# Patient Record
Sex: Female | Born: 1950 | Race: Black or African American | Hispanic: No | Smoking: Former smoker
Health system: Southern US, Community
[De-identification: ages and names within clinical notes are randomized; demographics above are authoritative.]

## PROBLEM LIST (undated history)

## (undated) DIAGNOSIS — I341 Nonrheumatic mitral (valve) prolapse: Secondary | ICD-10-CM

## (undated) DIAGNOSIS — I509 Heart failure, unspecified: Secondary | ICD-10-CM

## (undated) DIAGNOSIS — G43909 Migraine, unspecified, not intractable, without status migrainosus: Secondary | ICD-10-CM

## (undated) DIAGNOSIS — Z9289 Personal history of other medical treatment: Secondary | ICD-10-CM

## (undated) HISTORY — PX: BUNIONECTOMY WITH HAMMERTOE RECONSTRUCTION: SHX5600

## (undated) HISTORY — PX: COLON SURGERY: SHX602

---

## 1972-07-17 HISTORY — PX: TUBAL LIGATION: SHX77

## 1996-07-17 HISTORY — PX: ABDOMINAL HYSTERECTOMY: SHX81

## 1997-11-30 ENCOUNTER — Other Ambulatory Visit: Admission: RE | Admit: 1997-11-30 | Discharge: 1997-11-30 | Payer: Self-pay | Admitting: Obstetrics

## 1998-02-17 ENCOUNTER — Inpatient Hospital Stay (HOSPITAL_COMMUNITY): Admission: RE | Admit: 1998-02-17 | Discharge: 1998-02-20 | Payer: Self-pay | Admitting: Obstetrics

## 1999-06-29 ENCOUNTER — Other Ambulatory Visit: Admission: RE | Admit: 1999-06-29 | Discharge: 1999-06-29 | Payer: Self-pay | Admitting: Obstetrics

## 1999-07-19 ENCOUNTER — Encounter: Admission: RE | Admit: 1999-07-19 | Discharge: 1999-07-19 | Payer: Self-pay | Admitting: Obstetrics

## 1999-07-19 ENCOUNTER — Encounter: Payer: Self-pay | Admitting: Obstetrics

## 1999-07-29 ENCOUNTER — Encounter: Admission: RE | Admit: 1999-07-29 | Discharge: 1999-07-29 | Payer: Self-pay | Admitting: Obstetrics

## 1999-07-29 ENCOUNTER — Encounter: Payer: Self-pay | Admitting: Obstetrics

## 2000-01-27 ENCOUNTER — Encounter: Payer: Self-pay | Admitting: Obstetrics

## 2000-01-27 ENCOUNTER — Encounter: Admission: RE | Admit: 2000-01-27 | Discharge: 2000-01-27 | Payer: Self-pay | Admitting: Obstetrics

## 2000-08-20 ENCOUNTER — Encounter: Admission: RE | Admit: 2000-08-20 | Discharge: 2000-08-20 | Payer: Self-pay | Admitting: Obstetrics

## 2000-08-20 ENCOUNTER — Encounter: Payer: Self-pay | Admitting: Obstetrics

## 2000-10-04 ENCOUNTER — Other Ambulatory Visit: Admission: RE | Admit: 2000-10-04 | Discharge: 2000-10-04 | Payer: Self-pay | Admitting: Obstetrics

## 2001-07-17 HISTORY — PX: APPENDECTOMY: SHX54

## 2001-10-07 ENCOUNTER — Encounter: Payer: Self-pay | Admitting: Obstetrics

## 2001-10-07 ENCOUNTER — Encounter: Admission: RE | Admit: 2001-10-07 | Discharge: 2001-10-07 | Payer: Self-pay | Admitting: Obstetrics

## 2002-10-13 ENCOUNTER — Ambulatory Visit (HOSPITAL_COMMUNITY): Admission: RE | Admit: 2002-10-13 | Discharge: 2002-10-13 | Payer: Self-pay | Admitting: Obstetrics

## 2002-10-13 ENCOUNTER — Encounter: Payer: Self-pay | Admitting: Obstetrics

## 2003-04-07 ENCOUNTER — Emergency Department (HOSPITAL_COMMUNITY): Admission: EM | Admit: 2003-04-07 | Discharge: 2003-04-07 | Payer: Self-pay | Admitting: Emergency Medicine

## 2003-06-03 ENCOUNTER — Ambulatory Visit (HOSPITAL_COMMUNITY): Admission: RE | Admit: 2003-06-03 | Discharge: 2003-06-03 | Payer: Self-pay | Admitting: Orthopedic Surgery

## 2003-10-19 ENCOUNTER — Ambulatory Visit (HOSPITAL_COMMUNITY): Admission: RE | Admit: 2003-10-19 | Discharge: 2003-10-19 | Payer: Self-pay | Admitting: Obstetrics

## 2004-02-15 ENCOUNTER — Ambulatory Visit (HOSPITAL_COMMUNITY): Admission: RE | Admit: 2004-02-15 | Discharge: 2004-02-15 | Payer: Self-pay | Admitting: Gastroenterology

## 2004-11-30 ENCOUNTER — Ambulatory Visit (HOSPITAL_COMMUNITY): Admission: RE | Admit: 2004-11-30 | Discharge: 2004-11-30 | Payer: Self-pay | Admitting: Obstetrics

## 2006-01-01 ENCOUNTER — Ambulatory Visit (HOSPITAL_COMMUNITY): Admission: RE | Admit: 2006-01-01 | Discharge: 2006-01-01 | Payer: Self-pay | Admitting: Obstetrics

## 2007-02-01 ENCOUNTER — Ambulatory Visit (HOSPITAL_COMMUNITY): Admission: RE | Admit: 2007-02-01 | Discharge: 2007-02-01 | Payer: Self-pay | Admitting: Internal Medicine

## 2008-02-10 ENCOUNTER — Ambulatory Visit (HOSPITAL_COMMUNITY): Admission: RE | Admit: 2008-02-10 | Discharge: 2008-02-10 | Payer: Self-pay | Admitting: Internal Medicine

## 2009-02-11 ENCOUNTER — Ambulatory Visit (HOSPITAL_COMMUNITY): Admission: RE | Admit: 2009-02-11 | Discharge: 2009-02-11 | Payer: Self-pay | Admitting: Internal Medicine

## 2009-03-02 ENCOUNTER — Ambulatory Visit: Payer: Self-pay | Admitting: Specialist

## 2010-04-04 ENCOUNTER — Ambulatory Visit (HOSPITAL_COMMUNITY): Admission: RE | Admit: 2010-04-04 | Discharge: 2010-04-04 | Payer: Self-pay | Admitting: Internal Medicine

## 2010-12-02 NOTE — Op Note (Signed)
NAME:  Savannah Bell, Savannah Bell                       ACCOUNT NO.:  [id number]   MEDICAL RECORD NO.:  [id number]                   PATIENT TYPE:  AMB   LOCATION:  ENDO                                 FACILITY:  [facility]   PHYSICIAN:  Anselmo Rod, M.D.               DATE OF BIRTH:  [birth year]   DATE OF PROCEDURE:  02/15/2004  DATE OF DISCHARGE:                                 OPERATIVE REPORT   PROCEDURE:  Screening colonoscopy.   ENDOSCOPIST:  Anselmo Rod, M.D.   INSTRUMENT:  Olympus video colonoscope.   INDICATIONS FOR PROCEDURE:  A 60 year old white female underwent screening  colonoscopy to rule out colonic polyps, masses, etc.   PRE-PROCEDURE PREPARATION:  Informed consent was procured from the patient.  Patient fasted for 8 hours prior to the procedure and prepped with a bottle  of magnesium citrate and a gallon of GoLYTELY the night prior to the  procedure.  She also received 1 g Ancef MVP prophylaxis prior to the  procedure.  Pre-procedure physical:  Patient had stable vital signs, neck  supple, chest clear to auscultation, S1/S2 regular, abdomen soft with normal  bowel sounds.   DESCRIPTION OF PROCEDURE:  The patient was placed in the left lateral  decubitus position, sedated with 70 mg of Demerol and 8 mg of Versed along  with 1 g of Ancef.  Once the patient was adequately sedated and maintained  on low flow oxygen, continuous cardiac monitoring; the Olympus video  colonoscope was advanced from the rectum to the cecum.  The appendiceal  orifice and the ileocecal valve were clearly visualized, photographed. The  patient had a somewhat tortuous colon.  The patient's position had to be  changed from the left lateral to the supine, and the right lateral position,  with gentle application of abdominal pressure on a couple of occasions to  reach the cecum.  No masses, polyps, erosions, ulcerations or diverticula  were seen.  Retroflexion in the rectum revealed no  abnormalities.   IMPRESSION:  1. Essentially unrevealing colonoscopy of the cecum.  2. Very tortuous colon, technically difficult procedure.   RECOMMENDATIONS:  1. Repeat colonoscopy has been recommended in the next 10 years unless the     patient develops any abnormal     symptoms in the interim.  2. Continue a high fiber diet with liberal fluid intake.  3. Outpatient follow up as the need arises in the future.                                               Anselmo Rod, M.D.    JNM/MEDQ  D:  02/15/2004  T:  02/15/2004  Job:  [id number]   cc:   Olene Craven, M.D.  [address]  [address]  [place]  [place] [address]  Fax: [phone]

## 2011-05-30 ENCOUNTER — Other Ambulatory Visit (HOSPITAL_COMMUNITY): Payer: Self-pay | Admitting: Nurse Practitioner

## 2011-05-30 DIAGNOSIS — Z1231 Encounter for screening mammogram for malignant neoplasm of breast: Secondary | ICD-10-CM

## 2011-07-03 ENCOUNTER — Ambulatory Visit (HOSPITAL_COMMUNITY)
Admission: RE | Admit: 2011-07-03 | Discharge: 2011-07-03 | Disposition: A | Discharge status: Home / Self Care | Payer: No Typology Code available for payment source | Source: Ambulatory Visit | Attending: Nurse Practitioner | Admitting: Nurse Practitioner

## 2011-07-03 DIAGNOSIS — Z1231 Encounter for screening mammogram for malignant neoplasm of breast: Secondary | ICD-10-CM | POA: Insufficient documentation

## 2011-09-02 ENCOUNTER — Encounter (HOSPITAL_COMMUNITY): Payer: Self-pay | Admitting: *Deleted

## 2011-09-02 ENCOUNTER — Inpatient Hospital Stay (HOSPITAL_COMMUNITY)
Admission: EM | Admit: 2011-09-02 | Discharge: 2011-09-15 | DRG: 330 | Disposition: A | Discharge status: Home Health | Payer: No Typology Code available for payment source | Attending: General Surgery | Admitting: General Surgery

## 2011-09-02 ENCOUNTER — Emergency Department (HOSPITAL_COMMUNITY): Payer: No Typology Code available for payment source

## 2011-09-02 ENCOUNTER — Other Ambulatory Visit: Payer: Self-pay

## 2011-09-02 DIAGNOSIS — K56609 Unspecified intestinal obstruction, unspecified as to partial versus complete obstruction: Secondary | ICD-10-CM

## 2011-09-02 DIAGNOSIS — K559 Vascular disorder of intestine, unspecified: Secondary | ICD-10-CM | POA: Diagnosis present

## 2011-09-02 DIAGNOSIS — R109 Unspecified abdominal pain: Secondary | ICD-10-CM

## 2011-09-02 DIAGNOSIS — K562 Volvulus: Secondary | ICD-10-CM | POA: Diagnosis present

## 2011-09-02 DIAGNOSIS — Y838 Other surgical procedures as the cause of abnormal reaction of the patient, or of later complication, without mention of misadventure at the time of the procedure: Secondary | ICD-10-CM | POA: Diagnosis not present

## 2011-09-02 DIAGNOSIS — K560 Paralytic ileus: Secondary | ICD-10-CM | POA: Diagnosis not present

## 2011-09-02 DIAGNOSIS — R112 Nausea with vomiting, unspecified: Secondary | ICD-10-CM | POA: Diagnosis present

## 2011-09-02 DIAGNOSIS — K929 Disease of digestive system, unspecified: Secondary | ICD-10-CM | POA: Diagnosis not present

## 2011-09-02 DIAGNOSIS — E876 Hypokalemia: Secondary | ICD-10-CM | POA: Diagnosis not present

## 2011-09-02 DIAGNOSIS — D62 Acute posthemorrhagic anemia: Secondary | ICD-10-CM | POA: Diagnosis not present

## 2011-09-02 DIAGNOSIS — K565 Intestinal adhesions [bands], unspecified as to partial versus complete obstruction: Principal | ICD-10-CM | POA: Diagnosis present

## 2011-09-02 HISTORY — DX: Nonrheumatic mitral (valve) prolapse: I34.1

## 2011-09-02 LAB — URINE MICROSCOPIC-ADD ON

## 2011-09-02 LAB — COMPREHENSIVE METABOLIC PANEL
ALT: 17 U/L (ref 0–35)
AST: 27 U/L (ref 0–37)
Albumin: 3.8 g/dL (ref 3.5–5.2)
Alkaline Phosphatase: 102 U/L (ref 39–117)
BUN: 20 mg/dL (ref 6–23)
CO2: 18 mEq/L — ABNORMAL LOW (ref 19–32)
Calcium: 9.9 mg/dL (ref 8.4–10.5)
Chloride: 97 mEq/L (ref 96–112)
Creatinine, Ser: 1.04 mg/dL (ref 0.50–1.10)
GFR calc Af Amer: 67 mL/min — ABNORMAL LOW (ref >90)
GFR calc non Af Amer: 58 mL/min — ABNORMAL LOW (ref >90)
Glucose, Bld: 210 mg/dL — ABNORMAL HIGH (ref 70–99)
Potassium: 4.9 mEq/L (ref 3.5–5.1)
Sodium: 136 mEq/L (ref 135–145)
Total Bilirubin: 0.5 mg/dL (ref 0.3–1.2)
Total Protein: 7.8 g/dL (ref 6.0–8.3)

## 2011-09-02 LAB — URINALYSIS, ROUTINE W REFLEX MICROSCOPIC
Bilirubin Urine: NEGATIVE
Glucose, UA: NEGATIVE mg/dL
Ketones, ur: NEGATIVE mg/dL
Leukocytes, UA: NEGATIVE
Nitrite: NEGATIVE
Protein, ur: 30 mg/dL — AB
Specific Gravity, Urine: >1.046 — ABNORMAL HIGH (ref 1.005–1.030)
Urobilinogen, UA: 0.2 mg/dL (ref 0.0–1.0)
pH: 5.5 (ref 5.0–8.0)

## 2011-09-02 LAB — DIFFERENTIAL
Basophils Absolute: 0 10*3/uL (ref 0.0–0.1)
Basophils Relative: 0 % (ref 0–1)
Eosinophils Absolute: 0 10*3/uL (ref 0.0–0.7)
Eosinophils Relative: 0 % (ref 0–5)
Lymphocytes Relative: 13 % (ref 12–46)
Lymphs Abs: 0.7 10*3/uL (ref 0.7–4.0)
Monocytes Absolute: 0.2 10*3/uL (ref 0.1–1.0)
Monocytes Relative: 4 % (ref 3–12)
Neutro Abs: 4.6 10*3/uL (ref 1.7–7.7)
Neutrophils Relative %: 83 % — ABNORMAL HIGH (ref 43–77)

## 2011-09-02 LAB — POCT I-STAT TROPONIN I: Troponin i, poc: 0.06 ng/mL (ref 0.00–0.08)

## 2011-09-02 LAB — CBC
HCT: 51.6 % — ABNORMAL HIGH (ref 36.0–46.0)
Hemoglobin: 18.1 g/dL — ABNORMAL HIGH (ref 12.0–15.0)
MCH: 30 pg (ref 26.0–34.0)
MCHC: 35.1 g/dL (ref 30.0–36.0)
MCV: 85.4 fL (ref 78.0–100.0)
Platelets: 258 10*3/uL (ref 150–400)
RBC: 6.04 MIL/uL — ABNORMAL HIGH (ref 3.87–5.11)
RDW: 13 % (ref 11.5–15.5)
WBC: 5.5 10*3/uL (ref 4.0–10.5)

## 2011-09-02 LAB — POCT I-STAT, CHEM 8
BUN: 22 mg/dL (ref 6–23)
Calcium, Ion: 1.12 mmol/L (ref 1.12–1.32)
Chloride: 103 mEq/L (ref 96–112)
Creatinine, Ser: 0.9 mg/dL (ref 0.50–1.10)
Glucose, Bld: 213 mg/dL — ABNORMAL HIGH (ref 70–99)
HCT: 56 % — ABNORMAL HIGH (ref 36.0–46.0)
Hemoglobin: 19 g/dL — ABNORMAL HIGH (ref 12.0–15.0)
Potassium: 4.1 mEq/L (ref 3.5–5.1)
Sodium: 139 mEq/L (ref 135–145)
TCO2: 23 mmol/L (ref 0–100)

## 2011-09-02 LAB — OCCULT BLOOD, POC DEVICE: Fecal Occult Bld: NEGATIVE

## 2011-09-02 LAB — LIPASE, BLOOD: Lipase: 89 U/L — ABNORMAL HIGH (ref 11–59)

## 2011-09-02 LAB — LACTIC ACID, PLASMA: Lactic Acid, Venous: 5.5 mmol/L — ABNORMAL HIGH (ref 0.5–2.2)

## 2011-09-02 MED ORDER — HEPARIN SODIUM (PORCINE) 5000 UNIT/ML IJ SOLN
5000.0000 [IU] | Freq: Three times a day (TID) | INTRAMUSCULAR | Status: DC
  Administered 2011-09-02 – 2011-09-15 (×38): 5000 [IU] via SUBCUTANEOUS
  Filled 2011-09-02 (×44): qty 1

## 2011-09-02 MED ORDER — KCL IN DEXTROSE-NACL 20-5-0.45 MEQ/L-%-% IV SOLN
INTRAVENOUS | Status: DC
  Administered 2011-09-02 – 2011-09-03 (×5): via INTRAVENOUS
  Filled 2011-09-02 (×7): qty 1000

## 2011-09-02 MED ORDER — IOHEXOL 300 MG/ML  SOLN: 100.0000 mL | Freq: Once | INTRAMUSCULAR | Status: DC | PRN

## 2011-09-02 MED ORDER — HYDROMORPHONE HCL PF 1 MG/ML IJ SOLN
1.0000 mg | Freq: Once | INTRAMUSCULAR | Status: AC
  Administered 2011-09-02: 1 mg via INTRAVENOUS
  Filled 2011-09-02: qty 1

## 2011-09-02 MED ORDER — SODIUM CHLORIDE 0.9 % IV BOLUS (SEPSIS)
1000.0000 mL | Freq: Once | INTRAVENOUS | Status: AC
  Administered 2011-09-02: 1000 mL via INTRAVENOUS

## 2011-09-02 MED ORDER — ONDANSETRON HCL 4 MG/2ML IJ SOLN
4.0000 mg | Freq: Once | INTRAMUSCULAR | Status: AC
  Administered 2011-09-02: 4 mg via INTRAVENOUS
  Filled 2011-09-02: qty 2

## 2011-09-02 MED ORDER — ONDANSETRON HCL 4 MG/2ML IJ SOLN
4.0000 mg | Freq: Four times a day (QID) | INTRAMUSCULAR | Status: DC | PRN
  Administered 2011-09-04 (×2): 4 mg via INTRAVENOUS
  Filled 2011-09-02 (×2): qty 2

## 2011-09-02 MED ORDER — PANTOPRAZOLE SODIUM 40 MG IV SOLR
40.0000 mg | Freq: Every day | INTRAVENOUS | Status: DC
  Administered 2011-09-02 – 2011-09-14 (×13): 40 mg via INTRAVENOUS
  Filled 2011-09-02 (×14): qty 40

## 2011-09-02 MED ORDER — LIP MEDEX EX OINT
TOPICAL_OINTMENT | CUTANEOUS | Status: AC
  Administered 2011-09-02: 15:00:00
  Filled 2011-09-02: qty 7

## 2011-09-02 MED ORDER — MORPHINE SULFATE 2 MG/ML IJ SOLN
0.5000 mg | INTRAMUSCULAR | Status: DC | PRN
  Administered 2011-09-03 – 2011-09-05 (×4): 0.5 mg via INTRAVENOUS
  Filled 2011-09-02 (×4): qty 1

## 2011-09-02 MED ORDER — DIPHENHYDRAMINE HCL 12.5 MG/5ML PO ELIX: 12.5000 mg | ORAL_SOLUTION | Freq: Four times a day (QID) | ORAL | Status: DC | PRN

## 2011-09-02 MED ORDER — DIPHENHYDRAMINE HCL 50 MG/ML IJ SOLN: 12.5000 mg | Freq: Four times a day (QID) | INTRAMUSCULAR | Status: DC | PRN

## 2011-09-02 NOTE — ED Notes (Signed)
Assisted MD with rectal exam

## 2011-09-02 NOTE — ED Notes (Signed)
Pt arrived via triage,  She is from home where she resides alone pt has vomited 5 times since yesterday,  She has abdominal pain upper right quadrant continuing to right lower quadrant

## 2011-09-02 NOTE — ED Notes (Signed)
Pt completed oral contrast

## 2011-09-02 NOTE — ED Notes (Signed)
Pt to ct 

## 2011-09-02 NOTE — ED Notes (Signed)
Pt has vomites x 5 or 6 since yesterday.  Pt is having difficulty keeping PO food or fluid down.  Pt denies diarrhea.

## 2011-09-02 NOTE — ED Notes (Signed)
Blood cultures times two sent to main lab

## 2011-09-02 NOTE — ED Provider Notes (Signed)
  Physical Exam  BP 125/80  Pulse 105  Temp(Src) 97.6 F (36.4 C) (Oral)  Resp 17  Ht 4\' 11"  (1.499 m)  Wt 143 lb (64.864 kg)  BMI 28.88 kg/m2  SpO2 95%  Physical Exam  ED Course  Procedures  MDM  7:31 AM Pt is currently comfortable, waiting for CT scan.  IVF's being given.     8:39 AM Dilated SB loops and mesenteric inflammation noted by radiologist.  Will admit.  Lactic acid is elevated CO2 on electrolyte panel is low at 18.  Pt remains fairly comfortable for now.  ECG shows no arrythmia's.  Could still be due to severe dehydration.  Will need inpt monitoring.      8:57 AM Rectal exam with chaperone performed.  Scant stool in vault.  Pt denies flatus since yesterday.  Pt has had 2 surgeris in the past, I favor SBO.  Will try NGT, consult surgery.  Heme negative occult testing.  Will continue IVF's, and antiemetics.    9:10 AM Dr. Daphine Deutscher to see pt in the ED.  Gavin Pound. Oletta Lamas, MD 09/02/11 [phone]

## 2011-09-02 NOTE — ED Notes (Signed)
Report received from night RN. First contact with patient. Pt sitting up in bed, eyes closed, oxygen 2 L [place] infusing. Finished contrast. Now waiting for CT scan. Pt aware we need urine sample.

## 2011-09-02 NOTE — ED Notes (Signed)
Transfer to 1527-report given to [employer]

## 2011-09-02 NOTE — H&P (Signed)
Chief Complaint:  Abdominal pain, nausea and vomiting x 1 day  History of Present Illness:  Savannah Bell is an 61 y.o. female who presented this am with a history of abdominal pain with nausea and vomiting.  CT scan obtained was reviewed and showed ascites, a pharygian cap of the GB, some thickened bowel loops and evidence of a SBO.  She reports no flatus for 1 day.  She previously had a ruptured appendicits managed with a drain by Mosetta Anis.    Past Medical History  Diagnosis Date  . Mitral valve prolapse     Past Surgical History  Procedure Date  . Appendectomy   . Abdominal hysterectomy     Current Facility-Administered Medications  Medication Dose Route Frequency Provider Last Rate Last Dose  . HYDROmorphone (DILAUDID) injection 1 mg  1 mg Intravenous Once Gwyneth Sprout, MD   1 mg at 09/02/11 0615  . iohexol (OMNIPAQUE) 300 MG/ML solution 100 mL  100 mL Intravenous Once PRN Medication Radiologist, MD      . ondansetron (ZOFRAN) injection 4 mg  4 mg Intravenous Once Gwyneth Sprout, MD   4 mg at 09/02/11 1610  . sodium chloride 0.9 % bolus 1,000 mL  1,000 mL Intravenous Once Gwyneth Sprout, MD   1,000 mL at 09/02/11 9604   Current Outpatient Prescriptions  Medication Sig Dispense Refill  . aspirin 325 MG tablet Take 325 mg by mouth daily.      . calcium gluconate 500 MG tablet Take 500 mg by mouth daily.      . cholecalciferol (VITAMIN D) 1000 UNITS tablet Take 1,000 Units by mouth daily.       Review of patient's allergies indicates no known allergies. History reviewed. No pertinent family history. Social History:   does not have a smoking history on file. She does not have any smokeless tobacco history on file. Her alcohol and drug histories not on file.   REVIEW OF SYSTEMS - PERTINENT POSITIVES ONLY: negative  Physical Exam:   Blood pressure 120/87, pulse 112, temperature 97.6 F (36.4 C), temperature source Oral, resp. rate 17, height 4\' 11"  (1.499 m),  weight 143 lb (64.864 kg), SpO2 100.00%. Body mass index is 28.88 kg/(m^2).  Gen:  WDWN AA NAD with NG tube in place Neurological: Alert and oriented to person, place, and time. Motor and sensory function is grossly intact  Head: Normocephalic and atraumatic.  Eyes: Conjunctivae are normal. Pupils are equal, round, and reactive to light. No scleral icterus.  Neck: Normal range of motion. Neck supple. No tracheal deviation or thyromegaly present.  Cardiovascular:  SR without murmurs or gallops.  No carotid bruits Respiratory: Effort normal.  No respiratory distress. No chest wall tenderness. Breath sounds normal.  No wheezes, rales or rhonchi.  Abdomen:  Flat, no rebound or guarding.  She reports that the pain that she was having is gone GU: Musculoskeletal: Normal range of motion. Extremities are nontender. No cyanosis, edema or clubbing noted Lymphadenopathy: No cervical, preauricular, postauricular or axillary adenopathy is present Skin: Skin is warm and dry. No rash noted. No diaphoresis. No erythema. No pallor. Pscyh: Normal mood and affect. Behavior is normal. Judgment and thought content normal.   LABORATORY RESULTS: Results for orders placed during the hospital encounter of 09/02/11 (from the past 48 hour(s))  COMPREHENSIVE METABOLIC PANEL     Status: Abnormal   Collection Time   09/02/11  6:00 AM      Component Value Range Comment   Sodium 136  135 - 145 (mEq/L)    Potassium 4.9  3.5 - 5.1 (mEq/L)    Chloride 97  96 - 112 (mEq/L)    CO2 18 (*) 19 - 32 (mEq/L)    Glucose, Bld 210 (*) 70 - 99 (mg/dL)    BUN 20  6 - 23 (mg/dL)    Creatinine, Ser 1.61  0.50 - 1.10 (mg/dL)    Calcium 9.9  8.4 - 10.5 (mg/dL)    Total Protein 7.8  6.0 - 8.3 (g/dL)    Albumin 3.8  3.5 - 5.2 (g/dL)    AST 27  0 - 37 (U/L)    ALT 17  0 - 35 (U/L)    Alkaline Phosphatase 102  39 - 117 (U/L)    Total Bilirubin 0.5  0.3 - 1.2 (mg/dL)    GFR calc non Af Amer 58 (*) >90 (mL/min)    GFR calc Af Amer 67  (*) >90 (mL/min)   CBC     Status: Abnormal   Collection Time   09/02/11  6:00 AM      Component Value Range Comment   WBC 5.5  4.0 - 10.5 (K/uL)    RBC 6.04 (*) 3.87 - 5.11 (MIL/uL)    Hemoglobin 18.1 (*) 12.0 - 15.0 (g/dL)    HCT 09.6 (*) 04.5 - 46.0 (%)    MCV 85.4  78.0 - 100.0 (fL)    MCH 30.0  26.0 - 34.0 (pg)    MCHC 35.1  30.0 - 36.0 (g/dL)    RDW 40.9  81.1 - 91.4 (%)    Platelets 258  150 - 400 (K/uL)   DIFFERENTIAL     Status: Abnormal   Collection Time   09/02/11  6:00 AM      Component Value Range Comment   Neutrophils Relative 83 (*) 43 - 77 (%)    Neutro Abs 4.6  1.7 - 7.7 (K/uL)    Lymphocytes Relative 13  12 - 46 (%)    Lymphs Abs 0.7  0.7 - 4.0 (K/uL)    Monocytes Relative 4  3 - 12 (%)    Monocytes Absolute 0.2  0.1 - 1.0 (K/uL)    Eosinophils Relative 0  0 - 5 (%)    Eosinophils Absolute 0.0  0.0 - 0.7 (K/uL)    Basophils Relative 0  0 - 1 (%)    Basophils Absolute 0.0  0.0 - 0.1 (K/uL)   LIPASE, BLOOD     Status: Abnormal   Collection Time   09/02/11  6:00 AM      Component Value Range Comment   Lipase 89 (*) 11 - 59 (U/L)   LACTIC ACID, PLASMA     Status: Abnormal   Collection Time   09/02/11  6:00 AM      Component Value Range Comment   Lactic Acid, Venous 5.5 (*) 0.5 - 2.2 (mmol/L)   POCT I-STAT TROPONIN I     Status: Normal   Collection Time   09/02/11  6:17 AM      Component Value Range Comment   Troponin i, poc 0.06  0.00 - 0.08 (ng/mL)    Comment 3            POCT I-STAT, CHEM 8     Status: Abnormal   Collection Time   09/02/11  6:19 AM      Component Value Range Comment   Sodium 139  135 - 145 (mEq/L)    Potassium 4.1  3.5 -  5.1 (mEq/L)    Chloride 103  96 - 112 (mEq/L)    BUN 22  6 - 23 (mg/dL)    Creatinine, Ser 1.19  0.50 - 1.10 (mg/dL)    Glucose, Bld 147 (*) 70 - 99 (mg/dL)    Calcium, Ion 8.29  1.12 - 1.32 (mmol/L)    TCO2 23  0 - 100 (mmol/L)    Hemoglobin 19.0 (*) 12.0 - 15.0 (g/dL)    HCT 56.2 (*) 13.0 - 46.0 (%)   URINALYSIS,  ROUTINE W REFLEX MICROSCOPIC     Status: Abnormal   Collection Time   09/02/11  8:32 AM      Component Value Range Comment   Color, Urine YELLOW  YELLOW     APPearance CLEAR  CLEAR     Specific Gravity, Urine >1.046 (*) 1.005 - 1.030     pH 5.5  5.0 - 8.0     Glucose, UA NEGATIVE  NEGATIVE (mg/dL)    Hgb urine dipstick MODERATE (*) NEGATIVE     Bilirubin Urine NEGATIVE  NEGATIVE     Ketones, ur NEGATIVE  NEGATIVE (mg/dL)    Protein, ur 30 (*) NEGATIVE (mg/dL)    Urobilinogen, UA 0.2  0.0 - 1.0 (mg/dL)    Nitrite NEGATIVE  NEGATIVE     Leukocytes, UA NEGATIVE  NEGATIVE    URINE MICROSCOPIC-ADD ON     Status: Abnormal   Collection Time   09/02/11  8:32 AM      Component Value Range Comment   Squamous Epithelial / LPF RARE  RARE     WBC, UA 0-2  <3 (WBC/hpf)    RBC / HPF 3-6  <3 (RBC/hpf)    Casts HYALINE CASTS (*) NEGATIVE    OCCULT BLOOD, POC DEVICE     Status: Normal   Collection Time   09/02/11  8:59 AM      Component Value Range Comment   Fecal Occult Bld NEGATIVE       RADIOLOGY RESULTS: Ct Abdomen Pelvis W Contrast  09/02/2011  *RADIOLOGY REPORT*  Clinical Data: Nausea, vomiting.  CT ABDOMEN AND PELVIS WITH CONTRAST  Technique:  Multidetector CT imaging of the abdomen and pelvis was performed following the standard protocol during bolus administration of intravenous contrast.  Contrast:  100 ml Omnipaque-300  Comparison: None.  Findings: Limited images through the lung bases demonstrate no significant appreciable abnormality. The heart size is within normal limits. No pleural or pericardial effusion.  Unremarkable liver, spleen, pancreas, adrenal glands, kidneys. There is mucosal hyperenhancement of  the gallbladder wall.  No radiodense gallstones.  No biliary ductal dilatation.  Decompressed bladder.  Absent uterus.  No adnexal mass.  Appendix not identified.  There are several loops of small bowel with distension up to 3 cm and bowel wall thickening.  There is stranding of the  small bowel mesentery and the a moderate amount of ascites.  No free intraperitoneal air identified.  Normal caliber vasculature.  No acute osseous abnormality.  T11-12 fusion may be congenital.  IMPRESSION: There are several loops of dilated small bowel with bowel wall thickening and perienteric inflammation.  Bowel ischemia not excluded.  The pattern appears obstructed as bowel distal to this point is decompressed.  There is a moderate amount of ascites.  No free intraperitoneal air.  Discussed via telephone with Jaynie Crumble in the emergency room at 08:25 a.m. on 09/02/2011.  Original Report Authenticated By: Waneta Martins, M.D.    Problem List: Patient Active Problem List  Diagnoses  . SBO (small bowel obstruction)    Assessment & Plan: SBO; feeling better at present; slight bump in lactic acid;  WBC normal.  Will observe with NG tube for now.      Matt B. Daphine Deutscher, MD, [facility] [phone] beeper [phone]  09/02/2011 10:46 AM

## 2011-09-02 NOTE — ED Provider Notes (Signed)
History     CSN: [id number]  Arrival date & time 09/02/11  4098   First MD Initiated Contact with Patient 09/02/11 [phone]      Chief Complaint  Patient presents with  . Abdominal Pain  . Nausea  . Emesis    (Consider location/radiation/quality/duration/timing/severity/associated sxs/prior treatment) Patient is a 61 y.o. female presenting with abdominal pain and vomiting. The history is provided by the patient and a relative.  Abdominal Pain The primary symptoms of the illness include abdominal pain, nausea and vomiting. The primary symptoms of the illness do not include fever, shortness of breath, diarrhea or dysuria. The current episode started yesterday. The onset of the illness was gradual. The problem has been gradually worsening.  The abdominal pain began yesterday (Has not felt well for 2 days the pain didn't start until yesterday). The pain came on gradually. The abdominal pain is located in the epigastric region. The abdominal pain radiates to the RLQ, LUQ, LLQ and RUQ. The severity of the abdominal pain is 10/10. The abdominal pain is relieved by nothing. The abdominal pain is exacerbated by vomiting, movement and eating.  Nausea began yesterday.  Vomiting occurs 6 to 10 times per day. The emesis contains stomach contents.  The patient has not had a change in bowel habit. Additional symptoms associated with the illness include chills, anorexia and diaphoresis. Symptoms associated with the illness do not include constipation, urgency, frequency or back pain. Significant associated medical issues do not include PUD, GERD, diabetes or gallstones.  Emesis  Associated symptoms include abdominal pain and chills. Pertinent negatives include no diarrhea and no fever.    History reviewed. No pertinent past medical history.  Past Surgical History  Procedure Date  . Appendectomy     History reviewed. No pertinent family history.  History  Substance Use Topics  . Smoking status: Not  on file  . Smokeless tobacco: Not on file  . Alcohol Use:     OB History    Grav Para Term Preterm Abortions TAB SAB Ect Mult Living                  Review of Systems  Constitutional: Positive for chills and diaphoresis. Negative for fever.  Respiratory: Negative for shortness of breath.   Gastrointestinal: Positive for nausea, vomiting, abdominal pain and anorexia. Negative for diarrhea and constipation.  Genitourinary: Negative for dysuria, urgency and frequency.  Musculoskeletal: Negative for back pain.  All other systems reviewed and are negative.    Allergies  Review of patient's allergies indicates no known allergies.  Home Medications  No current outpatient prescriptions on file.  BP 99/62  Pulse 130  Temp(Src) 97.5 F (36.4 C) (Oral)  Resp 17  Ht 4\' 11"  (1.499 m)  Wt 143 lb (64.864 kg)  BMI 28.88 kg/m2  SpO2 100%  Physical Exam  Nursing note and vitals reviewed. Constitutional: She is oriented to person, place, and time. She appears well-developed and well-nourished. She appears distressed.  HENT:  Head: Normocephalic and atraumatic.  Mouth/Throat: Mucous membranes are dry.  Eyes: EOM are normal. Pupils are equal, round, and reactive to light.  Cardiovascular: Regular rhythm, normal heart sounds and intact distal pulses.  Tachycardia present.  Exam reveals no friction rub.   No murmur heard. Pulmonary/Chest: Effort normal and breath sounds normal. She has no wheezes. She has no rales.  Abdominal: Soft. She exhibits no distension. Bowel sounds are decreased. There is generalized tenderness. There is rebound and guarding. There is no CVA  tenderness. No hernia.  Musculoskeletal: Normal range of motion. She exhibits no edema and no tenderness.       No edema  Neurological: She is alert and oriented to person, place, and time. No cranial nerve deficit.  Skin: No rash noted. She is diaphoretic. There is pallor.  Psychiatric: She has a normal mood and affect. Her  behavior is normal.    ED Course  Procedures (including critical care time)  Labs Reviewed  COMPREHENSIVE METABOLIC PANEL - Abnormal; Notable for the following:    CO2 18 (*)    Glucose, Bld 210 (*)    GFR calc non Af Amer 58 (*)    GFR calc Af Amer 67 (*)    All other components within normal limits  CBC - Abnormal; Notable for the following:    RBC 6.04 (*)    Hemoglobin 18.1 (*)    HCT 51.6 (*)    All other components within normal limits  DIFFERENTIAL - Abnormal; Notable for the following:    Neutrophils Relative 83 (*)    All other components within normal limits  LIPASE, BLOOD - Abnormal; Notable for the following:    Lipase 89 (*)    All other components within normal limits  LACTIC ACID, PLASMA - Abnormal; Notable for the following:    Lactic Acid, Venous 5.5 (*)    All other components within normal limits  POCT I-STAT, CHEM 8 - Abnormal; Notable for the following:    Glucose, Bld 213 (*)    Hemoglobin 19.0 (*)    HCT 56.0 (*)    All other components within normal limits  POCT I-STAT TROPONIN I  URINALYSIS, ROUTINE W REFLEX MICROSCOPIC   No results found.   Date: 09/02/2011  Rate: 97  Rhythm: normal sinus rhythm  QRS Axis: normal  Intervals: normal  ST/T Wave abnormalities: normal  Conduction Disutrbances:none  Narrative Interpretation:   Old EKG Reviewed: none available    No diagnosis found.    MDM   Patient with abdominal pain concern for a viscus. She's not been feeling well for the last few days and then started having severe abdominal pain and vomiting for the last 12 hours. Patient is to, tachycardic and mildly hypotensive. She has no past medical history and is otherwise healthy. She was peritoneal signs on exam his tenderness and no localized findings. Mildly diaphoretic but normal mental status. She has normal pulses and doubt this is a AAA.  Suspicion for cardiac or pulmonary etiology.  A CBC, CMP, lactate, UA, lipase pending. Will rule out  pancreatitis, cholecystitis, perforation. CT of the abdomen pending. IV fluids, pain and nausea medication given.  6:48 AM CBC with normal white blood cell count hemoconcentrated. Lactic acid elevated at 5 consistent with dehydration. After one round of pain medication on reevaluation the patient's pain is much improved and she is drinking oral contrast. EKG within normal limits troponin negative.  CT pending.  Pt checked out to Dr. Debbe Odea, MD 09/02/11 [phone]

## 2011-09-03 ENCOUNTER — Inpatient Hospital Stay (HOSPITAL_COMMUNITY): Payer: No Typology Code available for payment source

## 2011-09-03 LAB — BASIC METABOLIC PANEL
BUN: 31 mg/dL — ABNORMAL HIGH (ref 6–23)
CO2: 25 mEq/L (ref 19–32)
Calcium: 8.7 mg/dL (ref 8.4–10.5)
Chloride: 104 mEq/L (ref 96–112)
Creatinine, Ser: 1.31 mg/dL — ABNORMAL HIGH (ref 0.50–1.10)
GFR calc Af Amer: 51 mL/min — ABNORMAL LOW (ref >90)
GFR calc non Af Amer: 44 mL/min — ABNORMAL LOW (ref >90)
Glucose, Bld: 135 mg/dL — ABNORMAL HIGH (ref 70–99)
Potassium: 5.5 mEq/L — ABNORMAL HIGH (ref 3.5–5.1)
Sodium: 136 mEq/L (ref 135–145)

## 2011-09-03 LAB — CBC
HCT: 48.1 % — ABNORMAL HIGH (ref 36.0–46.0)
Hemoglobin: 16.2 g/dL — ABNORMAL HIGH (ref 12.0–15.0)
MCH: 29.1 pg (ref 26.0–34.0)
MCHC: 33.7 g/dL (ref 30.0–36.0)
MCV: 86.5 fL (ref 78.0–100.0)
Platelets: 201 10*3/uL (ref 150–400)
RBC: 5.56 MIL/uL — ABNORMAL HIGH (ref 3.87–5.11)
RDW: 13.5 % (ref 11.5–15.5)
WBC: 11.3 10*3/uL — ABNORMAL HIGH (ref 4.0–10.5)

## 2011-09-03 NOTE — Progress Notes (Signed)
Patient ID: Savannah Bell, female   DOB: January 13, 1951, 61 y.o.   MRN: [id number] [facility] Surgery Progress Note:   * No surgery found *  Subjective: Mental status is alert and clear.  Daughter in room Objective: Vital signs in last 24 hours: Temp:  [97.8 F (36.6 C)-98.8 F (37.1 C)] 98.3 F (36.8 C) (02/17 0955) Pulse Rate:  [108-132] 109  (02/17 0955) Resp:  [16-18] 16  (02/17 0955) BP: (93-122)/(53-84) 108/62 mmHg (02/17 0955) SpO2:  [96 %-98 %] 96 % (02/17 0955)  Intake/Output from previous day: 02/16 0701 - 02/17 0700 In: 1988.3 [I.V.:1958.3; NG/GT:30] Out: 1400 [Urine:350; Emesis/NG output:250] Intake/Output this shift: Total I/O In: -  Out: 100 [Urine:100]  Physical Exam: Work of breathing is  Normal.  Abdomen is less tender  Lab Results:  Results for orders placed during the hospital encounter of 09/02/11 (from the past 48 hour(s))  COMPREHENSIVE METABOLIC PANEL     Status: Abnormal   Collection Time   09/02/11  6:00 AM      Component Value Range Comment   Sodium 136  135 - 145 (mEq/L)    Potassium 4.9  3.5 - 5.1 (mEq/L)    Chloride 97  96 - 112 (mEq/L)    CO2 18 (*) 19 - 32 (mEq/L)    Glucose, Bld 210 (*) 70 - 99 (mg/dL)    BUN 20  6 - 23 (mg/dL)    Creatinine, Ser 4.09  0.50 - 1.10 (mg/dL)    Calcium 9.9  8.4 - 10.5 (mg/dL)    Total Protein 7.8  6.0 - 8.3 (g/dL)    Albumin 3.8  3.5 - 5.2 (g/dL)    AST 27  0 - 37 (U/L)    ALT 17  0 - 35 (U/L)    Alkaline Phosphatase 102  39 - 117 (U/L)    Total Bilirubin 0.5  0.3 - 1.2 (mg/dL)    GFR calc non Af Amer 58 (*) >90 (mL/min)    GFR calc Af Amer 67 (*) >90 (mL/min)   CBC     Status: Abnormal   Collection Time   09/02/11  6:00 AM      Component Value Range Comment   WBC 5.5  4.0 - 10.5 (K/uL)    RBC 6.04 (*) 3.87 - 5.11 (MIL/uL)    Hemoglobin 18.1 (*) 12.0 - 15.0 (g/dL)    HCT 81.1 (*) 91.4 - 46.0 (%)    MCV 85.4  78.0 - 100.0 (fL)    MCH 30.0  26.0 - 34.0 (pg)    MCHC 35.1  30.0 - 36.0 (g/dL)    RDW  78.2  95.6 - 21.3 (%)    Platelets 258  150 - 400 (K/uL)   DIFFERENTIAL     Status: Abnormal   Collection Time   09/02/11  6:00 AM      Component Value Range Comment   Neutrophils Relative 83 (*) 43 - 77 (%)    Neutro Abs 4.6  1.7 - 7.7 (K/uL)    Lymphocytes Relative 13  12 - 46 (%)    Lymphs Abs 0.7  0.7 - 4.0 (K/uL)    Monocytes Relative 4  3 - 12 (%)    Monocytes Absolute 0.2  0.1 - 1.0 (K/uL)    Eosinophils Relative 0  0 - 5 (%)    Eosinophils Absolute 0.0  0.0 - 0.7 (K/uL)    Basophils Relative 0  0 - 1 (%)    Basophils Absolute  0.0  0.0 - 0.1 (K/uL)   LIPASE, BLOOD     Status: Abnormal   Collection Time   09/02/11  6:00 AM      Component Value Range Comment   Lipase 89 (*) 11 - 59 (U/L)   LACTIC ACID, PLASMA     Status: Abnormal   Collection Time   09/02/11  6:00 AM      Component Value Range Comment   Lactic Acid, Venous 5.5 (*) 0.5 - 2.2 (mmol/L)   POCT I-STAT TROPONIN I     Status: Normal   Collection Time   09/02/11  6:17 AM      Component Value Range Comment   Troponin i, poc 0.06  0.00 - 0.08 (ng/mL)    Comment 3            POCT I-STAT, CHEM 8     Status: Abnormal   Collection Time   09/02/11  6:19 AM      Component Value Range Comment   Sodium 139  135 - 145 (mEq/L)    Potassium 4.1  3.5 - 5.1 (mEq/L)    Chloride 103  96 - 112 (mEq/L)    BUN 22  6 - 23 (mg/dL)    Creatinine, Ser 1.61  0.50 - 1.10 (mg/dL)    Glucose, Bld 096 (*) 70 - 99 (mg/dL)    Calcium, Ion 0.45  1.12 - 1.32 (mmol/L)    TCO2 23  0 - 100 (mmol/L)    Hemoglobin 19.0 (*) 12.0 - 15.0 (g/dL)    HCT 40.9 (*) 81.1 - 46.0 (%)   URINALYSIS, ROUTINE W REFLEX MICROSCOPIC     Status: Abnormal   Collection Time   09/02/11  8:32 AM      Component Value Range Comment   Color, Urine YELLOW  YELLOW     APPearance CLEAR  CLEAR     Specific Gravity, Urine >1.046 (*) 1.005 - 1.030     pH 5.5  5.0 - 8.0     Glucose, UA NEGATIVE  NEGATIVE (mg/dL)    Hgb urine dipstick MODERATE (*) NEGATIVE     Bilirubin  Urine NEGATIVE  NEGATIVE     Ketones, ur NEGATIVE  NEGATIVE (mg/dL)    Protein, ur 30 (*) NEGATIVE (mg/dL)    Urobilinogen, UA 0.2  0.0 - 1.0 (mg/dL)    Nitrite NEGATIVE  NEGATIVE     Leukocytes, UA NEGATIVE  NEGATIVE    URINE MICROSCOPIC-ADD ON     Status: Abnormal   Collection Time   09/02/11  8:32 AM      Component Value Range Comment   Squamous Epithelial / LPF RARE  RARE     WBC, UA 0-2  <3 (WBC/hpf)    RBC / HPF 3-6  <3 (RBC/hpf)    Casts HYALINE CASTS (*) NEGATIVE    OCCULT BLOOD, POC DEVICE     Status: Normal   Collection Time   09/02/11  8:59 AM      Component Value Range Comment   Fecal Occult Bld NEGATIVE     CBC     Status: Abnormal   Collection Time   09/03/11  4:50 AM      Component Value Range Comment   WBC 11.3 (*) 4.0 - 10.5 (K/uL)    RBC 5.56 (*) 3.87 - 5.11 (MIL/uL)    Hemoglobin 16.2 (*) 12.0 - 15.0 (g/dL)    HCT 91.4 (*) 78.2 - 46.0 (%)    MCV 86.5  78.0 -  100.0 (fL)    MCH 29.1  26.0 - 34.0 (pg)    MCHC 33.7  30.0 - 36.0 (g/dL)    RDW 16.1  09.6 - 04.5 (%)    Platelets 201  150 - 400 (K/uL)   BASIC METABOLIC PANEL     Status: Abnormal   Collection Time   09/03/11  4:50 AM      Component Value Range Comment   Sodium 136  135 - 145 (mEq/L)    Potassium 5.5 (*) 3.5 - 5.1 (mEq/L)    Chloride 104  96 - 112 (mEq/L)    CO2 25  19 - 32 (mEq/L)    Glucose, Bld 135 (*) 70 - 99 (mg/dL)    BUN 31 (*) 6 - 23 (mg/dL)    Creatinine, Ser 4.09 (*) 0.50 - 1.10 (mg/dL)    Calcium 8.7  8.4 - 10.5 (mg/dL)    GFR calc non Af Amer 44 (*) >90 (mL/min)    GFR calc Af Amer 51 (*) >90 (mL/min)     Radiology/Results: Dg Abd 1 View  09/03/2011  *RADIOLOGY REPORT*  Clinical Data: Abdominal pain  ABDOMEN - 1 VIEW  Comparison:   the previous day's study  Findings: Nasogastric tube extends into the decompressed stomach. Small bowel is nondilated.  There has been progression of the oral contrast material into the colon, which is nondilated.  No abnormal abdominal calcifications.   Right pelvic phleboliths.  Regional bones unremarkable.  IMPRESSION:  1.  Nasogastric tube placement to the stomach. 2.  Nonobstructive bowel gas pattern.  Original Report Authenticated By: Osa Craver, M.D.   Ct Abdomen Pelvis W Contrast  09/02/2011  *RADIOLOGY REPORT*  Clinical Data: Nausea, vomiting.  CT ABDOMEN AND PELVIS WITH CONTRAST  Technique:  Multidetector CT imaging of the abdomen and pelvis was performed following the standard protocol during bolus administration of intravenous contrast.  Contrast:  100 ml Omnipaque-300  Comparison: None.  Findings: Limited images through the lung bases demonstrate no significant appreciable abnormality. The heart size is within normal limits. No pleural or pericardial effusion.  Unremarkable liver, spleen, pancreas, adrenal glands, kidneys. There is mucosal hyperenhancement of  the gallbladder wall.  No radiodense gallstones.  No biliary ductal dilatation.  Decompressed bladder.  Absent uterus.  No adnexal mass.  Appendix not identified.  There are several loops of small bowel with distension up to 3 cm and bowel wall thickening.  There is stranding of the small bowel mesentery and the a moderate amount of ascites.  No free intraperitoneal air identified.  Normal caliber vasculature.  No acute osseous abnormality.  T11-12 fusion may be congenital.  IMPRESSION: There are several loops of dilated small bowel with bowel wall thickening and perienteric inflammation.  Bowel ischemia not excluded.  The pattern appears obstructed as bowel distal to this point is decompressed.  There is a moderate amount of ascites.  No free intraperitoneal air.  Discussed via telephone with Jaynie Crumble in the emergency room at 08:25 a.m. on 09/02/2011.  Original Report Authenticated By: Waneta Martins, M.D.    Anti-infectives: Anti-infectives    None      Assessment/Plan: Problem List: Patient Active Problem List  Diagnoses  . SBO (small bowel obstruction)      Contrast has reached the colon.  Will discontinue NG and advance to clear liquid diet * No surgery found *    LOS: 1 day   Matt B. Daphine Deutscher, MD, [facility] [phone]  beeper [phone]  09/03/2011 11:15 AM

## 2011-09-04 ENCOUNTER — Inpatient Hospital Stay (HOSPITAL_COMMUNITY): Payer: No Typology Code available for payment source

## 2011-09-04 DIAGNOSIS — K56609 Unspecified intestinal obstruction, unspecified as to partial versus complete obstruction: Secondary | ICD-10-CM

## 2011-09-04 LAB — CBC
HCT: 38.8 % (ref 36.0–46.0)
Hemoglobin: 13.2 g/dL (ref 12.0–15.0)
MCH: 29.3 pg (ref 26.0–34.0)
MCHC: 34 g/dL (ref 30.0–36.0)
MCV: 86 fL (ref 78.0–100.0)
Platelets: 151 10*3/uL (ref 150–400)
RBC: 4.51 MIL/uL (ref 3.87–5.11)
RDW: 13.1 % (ref 11.5–15.5)
WBC: 6.1 10*3/uL (ref 4.0–10.5)

## 2011-09-04 LAB — BASIC METABOLIC PANEL
BUN: 13 mg/dL (ref 6–23)
CO2: 26 mEq/L (ref 19–32)
Calcium: 8.7 mg/dL (ref 8.4–10.5)
Chloride: 98 mEq/L (ref 96–112)
Creatinine, Ser: 0.84 mg/dL (ref 0.50–1.10)
GFR calc Af Amer: 86 mL/min — ABNORMAL LOW (ref >90)
GFR calc non Af Amer: 75 mL/min — ABNORMAL LOW (ref >90)
Glucose, Bld: 137 mg/dL — ABNORMAL HIGH (ref 70–99)
Potassium: 4.8 mEq/L (ref 3.5–5.1)
Sodium: 131 mEq/L — ABNORMAL LOW (ref 135–145)

## 2011-09-04 MED ORDER — ONDANSETRON HCL 4 MG/2ML IJ SOLN
4.0000 mg | INTRAMUSCULAR | Status: DC | PRN
  Administered 2011-09-04 – 2011-09-05 (×6): 4 mg via INTRAVENOUS
  Filled 2011-09-04 (×6): qty 2

## 2011-09-04 MED ORDER — PROMETHAZINE HCL 25 MG/ML IJ SOLN
12.5000 mg | Freq: Once | INTRAMUSCULAR | Status: AC
  Administered 2011-09-04: 12.5 mg via INTRAVENOUS
  Filled 2011-09-04: qty 1

## 2011-09-04 MED ORDER — DEXTROSE-NACL 5-0.45 % IV SOLN
INTRAVENOUS | Status: DC
  Administered 2011-09-04 (×3): via INTRAVENOUS
  Administered 2011-09-05: 125 mL/h via INTRAVENOUS
  Administered 2011-09-05: 06:00:00 via INTRAVENOUS

## 2011-09-04 NOTE — Progress Notes (Signed)
Patient ID: Savannah Bell, female   DOB: 1950-12-03, 61 y.o.   MRN: [id number]    Subjective: Pt feeling worse today.  Had 1 episode of yellow colored emesis this morning.  Now feels less nausea after zofran.  Has yet to pass any flatus.  Does not feel distended.  Objective: Vital signs in last 24 hours: Temp:  [98.3 F (36.8 C)-99 F (37.2 C)] 98.7 F (37.1 C) (02/18 0621) Pulse Rate:  [100-111] 100  (02/18 0621) Resp:  [16-18] 18  (02/18 0621) BP: (108-119)/(62-80) 110/79 mmHg (02/18 0621) SpO2:  [95 %-98 %] 97 % (02/18 0621) Last BM Date: 09/01/11  Intake/Output from previous day: 02/17 0701 - 02/18 0700 In: 3438.8 [P.O.:420; I.V.:3018.8] Out: 1065 [Urine:1065] Intake/Output this shift: Total I/O In: -  Out: 250 [Urine:250]  PE: Abd: soft, mildly tender in epigastrum, few BS, ND Heart: mildly tachy, but regular Lungs: CTAB   Lab Results:   Basename 09/03/11 0450 09/02/11 0619 09/02/11 0600  WBC 11.3* -- 5.5  HGB 16.2* 19.0* --  HCT 48.1* 56.0* --  PLT 201 -- 258   BMET  Basename 09/03/11 0450 09/02/11 0619 09/02/11 0600  NA 136 139 --  K 5.5* 4.1 --  CL 104 103 --  CO2 25 -- 18*  GLUCOSE 135* 213* --  BUN 31* 22 --  CREATININE 1.31* 0.90 --  CALCIUM 8.7 -- 9.9   PT/INR No results found for this basename: LABPROT:2,INR:2 in the last 72 hours   Studies/Results: Dg Abd 1 View  09/03/2011  *RADIOLOGY REPORT*  Clinical Data: Abdominal pain  ABDOMEN - 1 VIEW  Comparison:   the previous day's study  Findings: Nasogastric tube extends into the decompressed stomach. Small bowel is nondilated.  There has been progression of the oral contrast material into the colon, which is nondilated.  No abnormal abdominal calcifications.  Right pelvic phleboliths.  Regional bones unremarkable.  IMPRESSION:  1.  Nasogastric tube placement to the stomach. 2.  Nonobstructive bowel gas pattern.  Original Report Authenticated By: Osa Craver, M.D.   Dg Abd Acute  W/chest  09/04/2011  *RADIOLOGY REPORT*  Clinical Data: Abdominal pain with nausea and constipation.  ACUTE ABDOMEN SERIES (ABDOMEN 2 VIEW & CHEST 1 VIEW)  Comparison: Abdominal films 09/03/2011.  CT abdomen pelvis 09/02/2011.  Findings:  Normal heart size.  Low lung volumes with bibasilar atelectasis.  Small left effusion.  No free air.  Nasogastric tube removed.  Abnormal gas pattern with distended mid abdominal small bowel loop with a long air-fluid level consistent with redeveloping partial small bowel obstruction.  The colon appears decompressed.  No abnormal calcifications are seen.  IMPRESSION: Interval worsening bowel gas pattern consistent with partial S B O following removal of nasogastric tube. Moderate distention of a mid abdominal small bowel loop with air fluid level.  Low lung volumes with bibasilar atelectasis and small effusions.  Original Report Authenticated By: Elsie Stain, M.D.    Anti-infectives: Anti-infectives    None       Assessment/Plan  1. PSBO, recurred after d/c NGT yesterday  Plan: 1. Will d/c clear liquids and make her NPO. 2. If she has further emesis, then she will need another NGT.  I have discussed this with her.  I have also discussed with her that if she does not improve she may require an operation.  She understands and is agreeable to all above mentioned. 3. Repeat films and labs in the am.   LOS: 2 days  OSBORNE,KELLY E 09/04/2011

## 2011-09-04 NOTE — Progress Notes (Signed)
Passed small amount flatus while I was in room, she still has hiccups and is burping though, some mild ab pain, no more nausea with meds, her abdomen is very mildly tender and not really distended, she has well healed low midline and rlq scars.  We discussed reexam in a few hours, if she does not continue to improve she will need ng tube back in and if not resolving by tomorrow will consider taking to or for loa given the length of time this has been occurring.

## 2011-09-04 NOTE — Progress Notes (Signed)
Pt having severe nausea.  Pt last given zofran at 0238.  Pt c/o gas and bloating feeling. Rocking chair in patients room and patient rocking.  Patient encouraged to back down from clears liquids. Paged Dr Dwain Sarna.  Awaiting response. 1610 notified Dr Dwain Sarna of the above. Order given for phenergan 12.5mg  x1 and abdominal series.  Report given to day rn.

## 2011-09-04 NOTE — Progress Notes (Signed)
Notified MD toth concern that patient is complaining about nausea zofran is every 6 hours and last given at 1422 Means,  N 09-04-11 18:43pm

## 2011-09-04 NOTE — Progress Notes (Signed)
Passing some more flatus and had small bm, will keep npo, recheck films am

## 2011-09-04 NOTE — Progress Notes (Signed)
Spoke with MD concerning about order, duplicate order, order for additional adb x ray discontinued Means,  N 09-04-11

## 2011-09-05 ENCOUNTER — Encounter (HOSPITAL_COMMUNITY): Admission: EM | Disposition: A | Discharge status: Home Health | Payer: Self-pay | Source: Home / Self Care

## 2011-09-05 ENCOUNTER — Inpatient Hospital Stay (HOSPITAL_COMMUNITY): Payer: No Typology Code available for payment source | Admitting: Anesthesiology

## 2011-09-05 ENCOUNTER — Encounter (HOSPITAL_COMMUNITY): Payer: Self-pay | Admitting: Anesthesiology

## 2011-09-05 ENCOUNTER — Inpatient Hospital Stay (HOSPITAL_COMMUNITY): Payer: No Typology Code available for payment source

## 2011-09-05 ENCOUNTER — Other Ambulatory Visit (INDEPENDENT_AMBULATORY_CARE_PROVIDER_SITE_OTHER): Payer: Self-pay | Admitting: General Surgery

## 2011-09-05 HISTORY — PX: APPLICATION OF WOUND VAC: SHX5189

## 2011-09-05 HISTORY — PX: LAPAROTOMY: SHX154

## 2011-09-05 HISTORY — PX: BOWEL RESECTION: SHX1257

## 2011-09-05 LAB — BASIC METABOLIC PANEL
BUN: 8 mg/dL (ref 6–23)
CO2: 28 mEq/L (ref 19–32)
Calcium: 8.6 mg/dL (ref 8.4–10.5)
Chloride: 98 mEq/L (ref 96–112)
Creatinine, Ser: 0.9 mg/dL (ref 0.50–1.10)
GFR calc Af Amer: 80 mL/min — ABNORMAL LOW (ref >90)
GFR calc non Af Amer: 69 mL/min — ABNORMAL LOW (ref >90)
Glucose, Bld: 146 mg/dL — ABNORMAL HIGH (ref 70–99)
Potassium: 4.7 mEq/L (ref 3.5–5.1)
Sodium: 132 mEq/L — ABNORMAL LOW (ref 135–145)

## 2011-09-05 LAB — CBC
HCT: 37.9 % (ref 36.0–46.0)
Hemoglobin: 12.6 g/dL (ref 12.0–15.0)
MCH: 28.5 pg (ref 26.0–34.0)
MCHC: 33.2 g/dL (ref 30.0–36.0)
MCV: 85.7 fL (ref 78.0–100.0)
Platelets: 160 10*3/uL (ref 150–400)
RBC: 4.42 MIL/uL (ref 3.87–5.11)
RDW: 12.9 % (ref 11.5–15.5)
WBC: 3.9 10*3/uL — ABNORMAL LOW (ref 4.0–10.5)

## 2011-09-05 LAB — SURGICAL PCR SCREEN
MRSA, PCR: NEGATIVE
Staphylococcus aureus: NEGATIVE

## 2011-09-05 SURGERY — LAPAROTOMY, EXPLORATORY
Anesthesia: General | Site: Abdomen | Wound class: Dirty or Infected

## 2011-09-05 MED ORDER — DROPERIDOL 2.5 MG/ML IJ SOLN
INTRAMUSCULAR | Status: DC | PRN
  Administered 2011-09-05: 0.625 mg via INTRAVENOUS

## 2011-09-05 MED ORDER — LACTATED RINGERS IV SOLN
INTRAVENOUS | Status: DC | PRN
  Administered 2011-09-05 (×3): via INTRAVENOUS

## 2011-09-05 MED ORDER — CISATRACURIUM BESYLATE 2 MG/ML IV SOLN
INTRAVENOUS | Status: DC | PRN
  Administered 2011-09-05: 2 mg via INTRAVENOUS
  Administered 2011-09-05: 6 mg via INTRAVENOUS

## 2011-09-05 MED ORDER — DIPHENHYDRAMINE HCL 50 MG/ML IJ SOLN: 12.5000 mg | Freq: Four times a day (QID) | INTRAMUSCULAR | Status: DC | PRN

## 2011-09-05 MED ORDER — SODIUM CHLORIDE 0.9 % IJ SOLN: 9.0000 mL | INTRAMUSCULAR | Status: DC | PRN

## 2011-09-05 MED ORDER — ACETAMINOPHEN 325 MG PO TABS: 650.0000 mg | ORAL_TABLET | Freq: Four times a day (QID) | ORAL | Status: DC | PRN

## 2011-09-05 MED ORDER — PROMETHAZINE HCL 25 MG/ML IJ SOLN: 6.2500 mg | INTRAMUSCULAR | Status: DC | PRN

## 2011-09-05 MED ORDER — NEOSTIGMINE METHYLSULFATE 1 MG/ML IJ SOLN
INTRAMUSCULAR | Status: DC | PRN
  Administered 2011-09-05: 2 mg via INTRAVENOUS

## 2011-09-05 MED ORDER — LACTATED RINGERS IV SOLN: INTRAVENOUS | Status: DC

## 2011-09-05 MED ORDER — SODIUM CHLORIDE 0.9 % IV SOLN
3.0000 g | Freq: Once | INTRAVENOUS | Status: DC
  Filled 2011-09-05: qty 3

## 2011-09-05 MED ORDER — HYDROMORPHONE HCL PF 1 MG/ML IJ SOLN: 0.2500 mg | INTRAMUSCULAR | Status: DC | PRN

## 2011-09-05 MED ORDER — ACETAMINOPHEN 10 MG/ML IV SOLN
1000.0000 mg | Freq: Four times a day (QID) | INTRAVENOUS | Status: DC
  Administered 2011-09-05: 1000 mg via INTRAVENOUS
  Filled 2011-09-05: qty 100

## 2011-09-05 MED ORDER — FENTANYL CITRATE 0.05 MG/ML IJ SOLN
INTRAMUSCULAR | Status: DC | PRN
  Administered 2011-09-05 (×3): 50 ug via INTRAVENOUS
  Administered 2011-09-05: 100 ug via INTRAVENOUS
  Administered 2011-09-05: 50 ug via INTRAVENOUS

## 2011-09-05 MED ORDER — ONDANSETRON HCL 4 MG/2ML IJ SOLN
INTRAMUSCULAR | Status: DC | PRN
  Administered 2011-09-05 (×2): 2 mg via INTRAVENOUS

## 2011-09-05 MED ORDER — PIPERACILLIN-TAZOBACTAM 3.375 G IVPB
3.3750 g | Freq: Three times a day (TID) | INTRAVENOUS | Status: AC
  Administered 2011-09-05 – 2011-09-12 (×22): 3.375 g via INTRAVENOUS
  Filled 2011-09-05 (×23): qty 50

## 2011-09-05 MED ORDER — GLYCOPYRROLATE 0.2 MG/ML IJ SOLN
INTRAMUSCULAR | Status: DC | PRN
  Administered 2011-09-05: .2 mg via INTRAVENOUS

## 2011-09-05 MED ORDER — LACTATED RINGERS IV SOLN
INTRAVENOUS | Status: DC | PRN
  Administered 2011-09-05: 16:00:00 via INTRAVENOUS

## 2011-09-05 MED ORDER — SUCCINYLCHOLINE CHLORIDE 20 MG/ML IJ SOLN
INTRAMUSCULAR | Status: DC | PRN
  Administered 2011-09-05: 100 mg via INTRAVENOUS

## 2011-09-05 MED ORDER — LACTATED RINGERS IV SOLN
INTRAVENOUS | Status: DC
  Administered 2011-09-05: 1000 mL via INTRAVENOUS

## 2011-09-05 MED ORDER — LIDOCAINE HCL (CARDIAC) 20 MG/ML IV SOLN
INTRAVENOUS | Status: DC | PRN
  Administered 2011-09-05: 100 mg via INTRAVENOUS

## 2011-09-05 MED ORDER — MORPHINE SULFATE (PF) 1 MG/ML IV SOLN
INTRAVENOUS | Status: AC
  Filled 2011-09-05: qty 25

## 2011-09-05 MED ORDER — SODIUM CHLORIDE 0.9 % IV SOLN
3.0000 g | INTRAVENOUS | Status: DC | PRN
  Administered 2011-09-05: 3 g via INTRAVENOUS

## 2011-09-05 MED ORDER — ACETAMINOPHEN 650 MG RE SUPP: 650.0000 mg | Freq: Four times a day (QID) | RECTAL | Status: DC | PRN

## 2011-09-05 MED ORDER — MORPHINE SULFATE (PF) 1 MG/ML IV SOLN
INTRAVENOUS | Status: DC
  Administered 2011-09-05: 3 mg via INTRAVENOUS
  Administered 2011-09-05 (×2): via INTRAVENOUS
  Administered 2011-09-06: 1.5 mg via INTRAVENOUS
  Administered 2011-09-06: 4.5 mg via INTRAVENOUS
  Administered 2011-09-06: 3.5 mL via INTRAVENOUS
  Administered 2011-09-06: 6 mg via INTRAVENOUS
  Administered 2011-09-06: 16:00:00 via INTRAVENOUS
  Administered 2011-09-07: 4.5 mg via INTRAVENOUS
  Administered 2011-09-07: 1.5 mg via INTRAVENOUS
  Administered 2011-09-07: 6 mg via INTRAVENOUS
  Administered 2011-09-07: 18:00:00 via INTRAVENOUS
  Administered 2011-09-07: 4.5 mg via INTRAVENOUS
  Administered 2011-09-07 – 2011-09-08 (×2): 6 mg via INTRAVENOUS
  Administered 2011-09-08: 7.5 mg via INTRAVENOUS
  Administered 2011-09-08: 4.5 mg via INTRAVENOUS
  Administered 2011-09-08 (×2): 1.5 mg via INTRAVENOUS
  Administered 2011-09-09 (×2): 3 mg via INTRAVENOUS
  Administered 2011-09-09: 7.5 mg via INTRAVENOUS
  Administered 2011-09-09: 13:00:00 via INTRAVENOUS
  Administered 2011-09-09: 1.5 mg via INTRAVENOUS
  Administered 2011-09-09: 4.5 mg via INTRAVENOUS
  Administered 2011-09-10: 22:00:00 via INTRAVENOUS
  Administered 2011-09-10: 1.5 mg via INTRAVENOUS
  Administered 2011-09-10: 3 mg via INTRAVENOUS
  Administered 2011-09-11: 4.02 mg via INTRAVENOUS
  Administered 2011-09-11: 3 mg via INTRAVENOUS
  Administered 2011-09-11: 4.5 mg via INTRAVENOUS
  Administered 2011-09-11: 1.5 mg via INTRAVENOUS
  Administered 2011-09-11 (×2): 3 mg via INTRAVENOUS
  Administered 2011-09-12: 18:00:00 via INTRAVENOUS
  Administered 2011-09-12: 3 mg via INTRAVENOUS
  Administered 2011-09-12: 1 mL via INTRAVENOUS
  Administered 2011-09-12: 5.56 mg via INTRAVENOUS
  Administered 2011-09-13: 4.5 mg via INTRAVENOUS
  Administered 2011-09-13: 39.59 mg via INTRAVENOUS
  Administered 2011-09-13: 3 mg via INTRAVENOUS
  Administered 2011-09-13 – 2011-09-14 (×2): 1.5 mg via INTRAVENOUS
  Filled 2011-09-05 (×7): qty 25

## 2011-09-05 MED ORDER — ACETAMINOPHEN 10 MG/ML IV SOLN
INTRAVENOUS | Status: DC | PRN
  Administered 2011-09-05: 1000 mg via INTRAVENOUS

## 2011-09-05 MED ORDER — NALOXONE HCL 0.4 MG/ML IJ SOLN: 0.4000 mg | INTRAMUSCULAR | Status: DC | PRN

## 2011-09-05 MED ORDER — DIPHENHYDRAMINE HCL 12.5 MG/5ML PO ELIX: 12.5000 mg | ORAL_SOLUTION | Freq: Four times a day (QID) | ORAL | Status: DC | PRN

## 2011-09-05 MED ORDER — 0.9 % SODIUM CHLORIDE (POUR BTL) OPTIME
TOPICAL | Status: DC | PRN
  Administered 2011-09-05: 5000 mL

## 2011-09-05 MED ORDER — MIDAZOLAM HCL 5 MG/5ML IJ SOLN
INTRAMUSCULAR | Status: DC | PRN
  Administered 2011-09-05: 1 mg via INTRAVENOUS

## 2011-09-05 MED ORDER — ACETAMINOPHEN 10 MG/ML IV SOLN
INTRAVENOUS | Status: AC
  Filled 2011-09-05: qty 100

## 2011-09-05 MED ORDER — PROPOFOL 10 MG/ML IV EMUL
INTRAVENOUS | Status: DC | PRN
  Administered 2011-09-05: 100 mg via INTRAVENOUS

## 2011-09-05 MED ORDER — PHENOL 1.4 % MT LIQD
1.0000 | OROMUCOSAL | Status: DC | PRN
  Filled 2011-09-05 (×2): qty 177

## 2011-09-05 MED ORDER — ONDANSETRON HCL 4 MG/2ML IJ SOLN: 4.0000 mg | Freq: Four times a day (QID) | INTRAMUSCULAR | Status: DC | PRN

## 2011-09-05 MED ORDER — SODIUM CHLORIDE 0.9 % IV SOLN
INTRAVENOUS | Status: AC
  Administered 2011-09-05: 125 mL/h via INTRAVENOUS
  Administered 2011-09-06: 13:00:00 via INTRAVENOUS

## 2011-09-05 MED ORDER — DEXAMETHASONE SODIUM PHOSPHATE 10 MG/ML IJ SOLN
INTRAMUSCULAR | Status: DC | PRN
  Administered 2011-09-05: 10 mg via INTRAVENOUS

## 2011-09-05 MED ORDER — CHLORHEXIDINE GLUCONATE 4 % EX LIQD
1.0000 "application " | Freq: Once | CUTANEOUS | Status: DC
  Filled 2011-09-05: qty 15

## 2011-09-05 SURGICAL SUPPLY — 51 items
APPLICATOR COTTON TIP 6IN STRL (MISCELLANEOUS) ×4 IMPLANT
BLADE EXTENDED COATED 6.5IN (ELECTRODE) IMPLANT
BLADE HEX COATED 2.75 (ELECTRODE) ×2 IMPLANT
CANISTER SUCTION 2500CC (MISCELLANEOUS) ×1 IMPLANT
CHLORAPREP W/TINT 26ML (MISCELLANEOUS) ×2 IMPLANT
CLOTH BEACON ORANGE TIMEOUT ST (SAFETY) ×2 IMPLANT
COVER MAYO STAND STRL (DRAPES) ×1 IMPLANT
DRAPE LAPAROSCOPIC ABDOMINAL (DRAPES) ×2 IMPLANT
DRAPE WARM FLUID 44X44 (DRAPE) ×1 IMPLANT
DRSG VAC ATS MED SENSATRAC (GAUZE/BANDAGES/DRESSINGS) ×2 IMPLANT
ELECT REM PT RETURN 9FT ADLT (ELECTROSURGICAL) ×2
ELECTRODE REM PT RTRN 9FT ADLT (ELECTROSURGICAL) ×1 IMPLANT
GLOVE BIO SURGEON STRL SZ 6.5 (GLOVE) ×1 IMPLANT
GLOVE BIO SURGEON STRL SZ7 (GLOVE) ×2 IMPLANT
GLOVE BIOGEL PI IND STRL 6 (GLOVE) IMPLANT
GLOVE BIOGEL PI IND STRL 7.0 (GLOVE) ×1 IMPLANT
GLOVE BIOGEL PI IND STRL 7.5 (GLOVE) ×1 IMPLANT
GLOVE BIOGEL PI INDICATOR 6 (GLOVE) ×1
GLOVE BIOGEL PI INDICATOR 7.0 (GLOVE) ×1
GLOVE BIOGEL PI INDICATOR 7.5 (GLOVE) ×1
GLOVE SURG SS PI 6.5 STRL IVOR (GLOVE) ×3 IMPLANT
GOWN PREVENTION PLUS LG XLONG (DISPOSABLE) ×3 IMPLANT
GOWN PREVENTION PLUS XLARGE (GOWN DISPOSABLE) ×1 IMPLANT
GOWN STRL NON-REIN LRG LVL3 (GOWN DISPOSABLE) ×2 IMPLANT
GOWN STRL REIN XL XLG (GOWN DISPOSABLE) ×1 IMPLANT
KIT BASIN OR (CUSTOM PROCEDURE TRAY) ×2 IMPLANT
LIGASURE IMPACT 36 18CM CVD LR (INSTRUMENTS) ×1 IMPLANT
MANIFOLD NEPTUNE II (INSTRUMENTS) ×1 IMPLANT
NS IRRIG 1000ML POUR BTL (IV SOLUTION) ×7 IMPLANT
PACK GENERAL/GYN (CUSTOM PROCEDURE TRAY) ×2 IMPLANT
RELOAD PROXIMATE 75MM BLUE (ENDOMECHANICALS) ×4 IMPLANT
RELOAD STAPLE 75 3.8 BLU REG (ENDOMECHANICALS) IMPLANT
SCALPEL HARMONIC ACE (MISCELLANEOUS) IMPLANT
SHEARS FOC LG CVD HARMONIC 17C (MISCELLANEOUS) IMPLANT
SPONGE GAUZE 4X4 12PLY (GAUZE/BANDAGES/DRESSINGS) ×2 IMPLANT
SPONGE LAP 18X18 X RAY DECT (DISPOSABLE) ×1 IMPLANT
STAPLER GUN LINEAR PROX 60 (STAPLE) ×1 IMPLANT
STAPLER PROXIMATE 75MM BLUE (STAPLE) ×1 IMPLANT
STAPLER VISISTAT 35W (STAPLE) ×2 IMPLANT
SUCTION POOLE TIP (SUCTIONS) ×2 IMPLANT
SUT PDS AB 1 CTX 36 (SUTURE) IMPLANT
SUT PDS AB 1 TP1 96 (SUTURE) ×2 IMPLANT
SUT SILK 2 0 (SUTURE) ×2
SUT SILK 2 0 SH CR/8 (SUTURE) ×1 IMPLANT
SUT SILK 2-0 18XBRD TIE 12 (SUTURE) ×1 IMPLANT
SUT SILK 3 0 (SUTURE) ×2
SUT SILK 3 0 SH CR/8 (SUTURE) ×3 IMPLANT
SUT SILK 3-0 18XBRD TIE 12 (SUTURE) IMPLANT
TOWEL OR 17X26 10 PK STRL BLUE (TOWEL DISPOSABLE) ×3 IMPLANT
TRAY FOLEY CATH 14FRSI W/METER (CATHETERS) ×1 IMPLANT
YANKAUER SUCT BULB TIP NO VENT (SUCTIONS) ×1 IMPLANT

## 2011-09-05 NOTE — Progress Notes (Signed)
Films still with dilated small bowel, she clearly is not improving.  I discussed with she and her daughter today going to OR later this afternoon for ex lap and LOA, possible bowel resection.  Risks and indications discussed as well as postoperative course.

## 2011-09-05 NOTE — Transfer of Care (Signed)
Immediate Anesthesia Transfer of Care Note  Patient: Savannah Bell  Procedure(s) Performed: Procedure(s) (LRB): EXPLORATORY LAPAROTOMY (N/A) LYSIS OF ADHESION (N/A) SMALL BOWEL RESECTION (N/A) APPLICATION OF WOUND VAC (N/A)  Patient Location: PACU  Anesthesia Type: General  Level of Consciousness: awake, oriented, sedated and patient cooperative  Airway & Oxygen Therapy: Patient Spontanous Breathing and Patient connected to face mask oxygen  Post-op Assessment: Report given to PACU RN, Post -op Vital signs reviewed and stable and Patient moving all extremities  Post vital signs: Reviewed and stable  Complications: No apparent anesthesia complications

## 2011-09-05 NOTE — Preoperative (Signed)
Beta Blockers   Reason not to administer Beta Blockers:Not Applicable 

## 2011-09-05 NOTE — Anesthesia Preprocedure Evaluation (Signed)
Anesthesia Evaluation  Patient identified by MRN, date of birth, ID band Patient awake    Reviewed: Allergy & Precautions, H&P , NPO status , Patient's Chart, lab work & pertinent test results  Airway Mallampati: II TM Distance: <3 FB Neck ROM: Full    Dental No notable dental hx.    Pulmonary neg pulmonary ROS,  clear to auscultation  Pulmonary exam normal       Cardiovascular neg cardio ROS Regular Normal    Neuro/Psych Negative Neurological ROS  Negative Psych ROS   GI/Hepatic negative GI ROS, Neg liver ROS,   Endo/Other  Negative Endocrine ROS  Renal/GU negative Renal ROS  Genitourinary negative   Musculoskeletal negative musculoskeletal ROS (+)   Abdominal   Peds negative pediatric ROS (+)  Hematology negative hematology ROS (+)   Anesthesia Other Findings   Reproductive/Obstetrics negative OB ROS                           Anesthesia Physical Anesthesia Plan  ASA: II  Anesthesia Plan: General   Post-op Pain Management:    Induction: Intravenous, Rapid sequence and Cricoid pressure planned  Airway Management Planned: Oral ETT  Additional Equipment:   Intra-op Plan:   Post-operative Plan: Extubation in OR  Informed Consent: I have reviewed the patients History and Physical, chart, labs and discussed the procedure including the risks, benefits and alternatives for the proposed anesthesia with the patient or authorized representative who has indicated his/her understanding and acceptance.   Dental advisory given  Plan Discussed with: CRNA  Anesthesia Plan Comments:         Anesthesia Quick Evaluation

## 2011-09-05 NOTE — Anesthesia Postprocedure Evaluation (Signed)
  Anesthesia Post-op Note  Patient: Savannah Bell  Procedure(s) Performed: Procedure(s) (LRB): EXPLORATORY LAPAROTOMY (N/A) LYSIS OF ADHESION (N/A) SMALL BOWEL RESECTION (N/A) APPLICATION OF WOUND VAC (N/A)  Patient Location: PACU  Anesthesia Type: General  Level of Consciousness: awake and alert   Airway and Oxygen Therapy: Patient Spontanous Breathing  Post-op Pain: mild  Post-op Assessment: Post-op Vital signs reviewed, Patient's Cardiovascular Status Stable, Respiratory Function Stable, Patent Airway and No signs of Nausea or vomiting  Post-op Vital Signs: stable  Complications: No apparent anesthesia complications

## 2011-09-05 NOTE — Progress Notes (Signed)
  Subjective: 5 small episodes of bilious emesis overnight but has passed small amt flatus Feels more distended and worse than yesterday afternoon  Objective: Vital signs in last 24 hours: Temp:  [97.8 F (36.6 C)-98.7 F (37.1 C)] 98.7 F (37.1 C) (02/19 0627) Pulse Rate:  [90-93] 93  (02/19 0627) Resp:  [18] 18  (02/19 0627) BP: (116-125)/(80-82) 122/80 mmHg (02/19 0627) SpO2:  [93 %-96 %] 96 % (02/19 0627) Last BM Date: 09/04/11  Intake/Output from previous day: 02/18 0701 - 02/19 0700 In: 2489.7 [P.O.:80; I.V.:2409.7] Out: 1150 [Urine:950; Emesis/NG output:200] Intake/Output this shift:    GI: distended mildly tender upper abdomen  Lab Results:   Basename 09/05/11 0345 09/04/11 0948  WBC 3.9* 6.1  HGB 12.6 13.2  HCT 37.9 38.8  PLT 160 151   BMET  Basename 09/05/11 0345 09/04/11 0949  NA 132* 131*  K 4.7 4.8  CL 98 98  CO2 28 26  GLUCOSE 146* 137*  BUN 8 13  CREATININE 0.90 0.84  CALCIUM 8.6 8.7    Assessment/Plan: SBO I think she is failing conservative mgt.  I thought she might have been improving yesterday but clearly she has taken a step back now.  Will look at films this am but I think good chance given length of time and failure to progress she will need exploration and LOA.     LOS: 3 days    [facility] 09/05/2011

## 2011-09-05 NOTE — Progress Notes (Signed)
Pt was given 4mg  zofran IV at 02:55 this morning for nausea and had an episode of vomiting at 0300. She vomited of green emesis, and said that she "felt better for a little while after that". Around 0615, patient had 2 epsiodes of vomiting the same color emesis about 100 ml. Gave 4 mg of Zofran at 0630. Will continue to monitor. Samara Snide [facility] 6:47 AM 09/05/2011

## 2011-09-05 NOTE — Progress Notes (Signed)
Pt has been educated regarding correct use of Incentive Spirometer (IS). Pt verbalizes understanding of correct use. Pt states that there is no further questions regarding correct use of the IS. Jessica Lorraine Peake, RN    

## 2011-09-05 NOTE — Progress Notes (Signed)
ANTIBIOTIC CONSULT NOTE - INITIAL  Pharmacy Consult for Zosyn Indication: Empiric  No Known Allergies  Patient Measurements: Height: 4\' 11"  (149.9 cm) Weight: 143 lb (64.864 kg) IBW/kg (Calculated) : 43.2    Vital Signs: Temp: 97.7 F (36.5 C) (02/19 1846) Temp src: Oral (02/19 1455) BP: 136/78 mmHg (02/19 1846) Pulse Rate: 99  (02/19 1846) Intake/Output from previous day: 02/18 0701 - 02/19 0700 In: 2489.7 [P.O.:80; I.V.:2409.7] Out: 1150 [Urine:950; Emesis/NG output:200] Intake/Output from this shift:    Labs:  Basename 09/05/11 0345 09/04/11 0949 09/04/11 0948 09/03/11 0450  WBC 3.9* -- 6.1 11.3*  HGB 12.6 -- 13.2 16.2*  PLT 160 -- 151 201  LABCREA -- -- -- --  CREATININE 0.90 0.84 -- 1.31*   Estimated Creatinine Clearance: 55.1 ml/min (by C-G formula based on Cr of 0.9). No results found for this basename: VANCOTROUGH:2,VANCOPEAK:2,VANCORANDOM:2,GENTTROUGH:2,GENTPEAK:2,GENTRANDOM:2,TOBRATROUGH:2,TOBRAPEAK:2,TOBRARND:2,AMIKACINPEAK:2,AMIKACINTROU:2,AMIKACIN:2, in the last 72 hours   Microbiology: Recent Results (from the past 720 hour(s))  SURGICAL PCR SCREEN     Status: Normal   Collection Time   09/05/11  1:07 PM      Component Value Range Status Comment   MRSA, PCR NEGATIVE  NEGATIVE  Final    Staphylococcus aureus NEGATIVE  NEGATIVE  Final     Medical History: Past Medical History  Diagnosis Date  . Mitral valve prolapse   . Smoking history     Medications:  Scheduled:    . heparin  5,000 Units Subcutaneous Q8H  . morphine   Intravenous Q4H  . morphine      . pantoprazole (PROTONIX) IV  40 mg Intravenous QHS  . DISCONTD: acetaminophen  1,000 mg Intravenous Q6H  . DISCONTD: ampicillin-sulbactam (UNASYN) IV  3 g Intravenous Once  . DISCONTD: chlorhexidine  1 application Topical Once   Infusions:    . sodium chloride 125 mL/hr (09/05/11 1904)  . DISCONTD: dextrose 5 % and 0.45% NaCl Stopped (09/05/11 1600)  . DISCONTD: lactated ringers 1,000 mL  (09/05/11 1540)  . DISCONTD: lactated ringers    . DISCONTD: lactated ringers     PRN: acetaminophen, acetaminophen, diphenhydrAMINE, diphenhydrAMINE, naloxone, ondansetron (ZOFRAN) IV, ondansetron (ZOFRAN) IV, phenol, sodium chloride, DISCONTD: 0.9 % irrigation (POUR BTL), DISCONTD: diphenhydrAMINE, DISCONTD: diphenhydrAMINE, DISCONTD: HYDROmorphone, DISCONTD: morphine, DISCONTD: promethazine  Assessment: 61 yo F s/p bowel/abdominal surgery to start empiric Zosyn.  Plan:  Zosyn 3.375g IV q8h  Annia Belt 09/05/2011,7:08 PM

## 2011-09-05 NOTE — Op Note (Signed)
Preoperative dx: SBO Postoperative dx: SAA, ischemic bowel Procedure: Exploratory laparotomy with lysis of adhesions, small bowel resection with primary anastomosis Surgeon: Dr. Harden Mo Asst: Dr. Jackquline Bosch: GETA Supervising anesthesiologist: Dr. Eilene Ghazi EBL: 100cc Comps: none Specimen: small bowel to pathology Sponge and needle count correct at end of operation Disposition to PACU stable  Indications: This is a 46 yof with prior ruptured appendicitis and hysterectomy who presented this weekend with SBO.  This appeared to be improving although her initial ct was concerning.  After her ng was removed, I saw her Monday and she started having some nausea again but was passing small amount of flatus. Today her exam worsened and she was vomiting.  We discussed going to OR for laparotomy.  Procedure: After informed consent was obtained, she was taken to operating room.  She was administered 3 grams of iv unasyn.  SCDs were placed on her lower extremities prior to induction.  She was then placed under GETA without complication  A foley was placed. Her abdomen was prepped and draped in the standard sterile surgical fashion.  A surgical timeout was performed.  A midline incision was made encompassing a portion of her prior low midline.  Her peritoneum was entered with return of a lot of bloody fluid. The small bowel was very dilated.  The bowel was examined and as I entered the right lower quadrant there clearly was a strangulated ischemic segment of jejunum that when I grasped it made a large enterotomy with spillage of intestinal contents.  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  It took around an 30 minutes to lyse all adhesions.  The entire bowel was run. There were some other dusky portions that were all pink upon completion.  I used a GIA stapler and the Ligasure device to resect the dead small bowel.  A stapled side to side small bowel anastomosis was then performed.  This was  patent, viable and under no tension.  The mesenteric defect was closed with 3-0 silk suture.  I repaired a couple other small serosal tears with 3-0 silk also.  The bowel was run several times and no other enterotomies were found and the remainder looked healthy.  I irrigated copiously.  I then closed with #1 PDS.  I stapled the umbilicus, irrigated the wound and then placed a vac sponge in the subq tissue due to my concern for infection.  Dressings were placed.  She tolerated this well, was extubated and transferred to PACU stable.

## 2011-09-06 ENCOUNTER — Inpatient Hospital Stay (HOSPITAL_COMMUNITY): Payer: No Typology Code available for payment source

## 2011-09-06 LAB — CBC
HCT: 37.8 % (ref 36.0–46.0)
Hemoglobin: 12.7 g/dL (ref 12.0–15.0)
MCH: 29 pg (ref 26.0–34.0)
MCHC: 33.6 g/dL (ref 30.0–36.0)
MCV: 86.3 fL (ref 78.0–100.0)
Platelets: 173 10*3/uL (ref 150–400)
RBC: 4.38 MIL/uL (ref 3.87–5.11)
RDW: 13 % (ref 11.5–15.5)
WBC: 5.1 10*3/uL (ref 4.0–10.5)

## 2011-09-06 LAB — GLUCOSE, CAPILLARY
Glucose-Capillary: 107 mg/dL — ABNORMAL HIGH (ref 70–99)
Glucose-Capillary: 130 mg/dL — ABNORMAL HIGH (ref 70–99)
Glucose-Capillary: 146 mg/dL — ABNORMAL HIGH (ref 70–99)

## 2011-09-06 LAB — BASIC METABOLIC PANEL
BUN: 9 mg/dL (ref 6–23)
CO2: 29 mEq/L (ref 19–32)
Calcium: 8 mg/dL — ABNORMAL LOW (ref 8.4–10.5)
Chloride: 100 mEq/L (ref 96–112)
Creatinine, Ser: 1.01 mg/dL (ref 0.50–1.10)
GFR calc Af Amer: 69 mL/min — ABNORMAL LOW (ref >90)
GFR calc non Af Amer: 60 mL/min — ABNORMAL LOW (ref >90)
Glucose, Bld: 115 mg/dL — ABNORMAL HIGH (ref 70–99)
Potassium: 4.9 mEq/L (ref 3.5–5.1)
Sodium: 134 mEq/L — ABNORMAL LOW (ref 135–145)

## 2011-09-06 MED ORDER — FAT EMULSION 20 % IV EMUL
250.0000 mL | INTRAVENOUS | Status: AC
  Administered 2011-09-06: 250 mL via INTRAVENOUS
  Filled 2011-09-06: qty 250

## 2011-09-06 MED ORDER — SODIUM CHLORIDE 0.9 % IV SOLN
INTRAVENOUS | Status: AC
  Administered 2011-09-07 (×2): via INTRAVENOUS

## 2011-09-06 MED ORDER — SODIUM CHLORIDE 0.9 % IJ SOLN
10.0000 mL | INTRAMUSCULAR | Status: DC | PRN
  Administered 2011-09-08 – 2011-09-15 (×4): 10 mL

## 2011-09-06 MED ORDER — TRACE MINERALS CR-CU-MN-SE-ZN 10-1000-500-60 MCG/ML IV SOLN
INTRAVENOUS | Status: AC
  Administered 2011-09-06: 18:00:00 via INTRAVENOUS
  Filled 2011-09-06: qty 1000

## 2011-09-06 NOTE — Progress Notes (Signed)
1 Day Post-Op  Subjective: Feels better today, no flatus, no n/v  Objective: Vital signs in last 24 hours: Temp:  [97.7 F (36.5 C)-99 F (37.2 C)] 98.5 F (36.9 C) (02/20 0615) Pulse Rate:  [87-114] 114  (02/20 0615) Resp:  [10-20] 18  (02/20 0807) BP: (99-143)/(35-84) 110/70 mmHg (02/20 0615) SpO2:  [96 %-100 %] 99 % (02/20 0807) Last BM Date: 09/05/11  Intake/Output from previous day: 02/19 0701 - 02/20 0700 In: 4854 [I.V.:4824; NG/GT:30] Out: 3860 [Urine:1675; Emesis/NG output:610; Blood:75] Intake/Output this shift:    General appearance: no distress Resp: clear to auscultation bilaterally Cardio: tachycardic RR GI: vac in place, no bs appropr tender  Lab Results:   Basename 09/06/11 0345 09/05/11 0345  WBC 5.1 3.9*  HGB 12.7 12.6  HCT 37.8 37.9  PLT 173 160   BMET  Basename 09/06/11 0345 09/05/11 0345  NA 134* 132*  K 4.9 4.7  CL 100 98  CO2 29 28  GLUCOSE 115* 146*  BUN 9 8  CREATININE 1.01 0.90  CALCIUM 8.0* 8.6    Assessment/Plan: POD #1 LOA, SBR 1. Neuro-continue PCA 2. CV/Pulm- aggressive pulm toilet 3. GI- expect prolonged ileus, npo for four days before OR, will place PICC and start TNA today, protonix 4. ID- 7 d postop abx course 5. Heparin/scds   LOS: 4 days    [facility] 09/06/2011

## 2011-09-06 NOTE — Progress Notes (Signed)
INITIAL ADULT NUTRITION ASSESSMENT Date: 09/06/2011   Time: 3:12 PM Reason for Assessment: Consult, TNA  ASSESSMENT: Female 61 y.o.  Dx: Abdominal pain, nausea, vomiting x 1 day  Hx:  Past Medical History  Diagnosis Date  . Mitral valve prolapse   . Smoking history    Related Meds:  Scheduled Meds:   . heparin  5,000 Units Subcutaneous Q8H  . morphine   Intravenous Q4H  . pantoprazole (PROTONIX) IV  40 mg Intravenous QHS  . piperacillin-tazobactam (ZOSYN)  IV  3.375 g Intravenous Q8H  . DISCONTD: ampicillin-sulbactam (UNASYN) IV  3 g Intravenous Once  . DISCONTD: chlorhexidine  1 application Topical Once   Continuous Infusions:   . sodium chloride 125 mL/hr at 09/06/11 1250  . sodium chloride    . fat emulsion    . TPN (CLINIMIX) +/- additives    . DISCONTD: dextrose 5 % and 0.45% NaCl Stopped (09/05/11 1600)  . DISCONTD: lactated ringers 1,000 mL (09/05/11 1540)  . DISCONTD: lactated ringers    . DISCONTD: lactated ringers     PRN Meds:.acetaminophen, acetaminophen, diphenhydrAMINE, diphenhydrAMINE, naloxone, ondansetron (ZOFRAN) IV, ondansetron (ZOFRAN) IV, phenol, sodium chloride, sodium chloride, DISCONTD: 0.9 % irrigation (POUR BTL), DISCONTD: diphenhydrAMINE, DISCONTD: diphenhydrAMINE, DISCONTD: HYDROmorphone, DISCONTD: morphine, DISCONTD: promethazine  Ht: 4\' 11"  (149.9 cm)  Wt: 143 lb (64.864 kg)  Ideal Wt: 44.6kg % Ideal Wt: 145  Usual Wt: 64.8kg % Usual Wt: 100  Body mass index is 28.88 kg/(m^2).  Food/Nutrition Related Hx: Pt reports abdominal pain, nausea, and vomiting started last Friday. Pt states prior to then she was eating 5 small meals per day, mostly vegetables and some fish. Pt reports usual body weight is 143 pounds. Pt found to have SBO per CT scan of abdomen/pelvis and had exploratory laparotomy with lysis of adhesions and small bowel resection yesterday with wound VAC placement. Since pt has not eaten much of anything in the past 5-6 days and  GI MD expects prolonged ileus, a PICC was placed and TNA will start today. Pt with NGT in place and denies any nausea, only c/o of dry mouth, has Biotene at bedside.   Labs:  CMP     Component Value Date/Time   NA 134* 09/06/2011 0345   K 4.9 09/06/2011 0345   CL 100 09/06/2011 0345   CO2 29 09/06/2011 0345   GLUCOSE 115* 09/06/2011 0345   BUN 9 09/06/2011 0345   CREATININE 1.01 09/06/2011 0345   CALCIUM 8.0* 09/06/2011 0345   PROT 7.8 09/02/2011 0600   ALBUMIN 3.8 09/02/2011 0600   AST 27 09/02/2011 0600   ALT 17 09/02/2011 0600   ALKPHOS 102 09/02/2011 0600   BILITOT 0.5 09/02/2011 0600   GFRNONAA 60* 09/06/2011 0345   GFRAA 69* 09/06/2011 0345   CBG (last 3)   Basename 09/06/11 1006  GLUCAP 107*    Intake/Output Summary (Last 24 hours) at 09/06/11 1519 Last data filed at 09/06/11 1429  Gross per 24 hour  Intake 4666.02 ml  Output   3680 ml  Net 986.02 ml   NGT output - green in color, total for the past day  Last BM - 09/05/11, pt with hypoactive bowel sounds this morning per RN charting  Diet Order:  NPO  IVF:    sodium chloride Last Rate: 125 mL/hr at 09/06/11 1250  sodium chloride   fat emulsion   TPN (CLINIMIX) +/- additives   DISCONTD: dextrose 5 % and 0.45% NaCl Last Rate: Stopped (09/05/11 1600)  DISCONTD: lactated ringers Last Rate: 1,000 mL (09/05/11 1540)  DISCONTD: lactated ringers   DISCONTD: lactated ringers     Estimated Nutritional Needs:   Kcal:1550-1750 Protein:95-115g Fluid:1.5-1.7L  NUTRITION DIAGNOSIS: -Inadequate oral intake (NI-2.1).  Status: Ongoing  RELATED TO: bowel rest with expected ileus  AS EVIDENCE BY: NPO, GI notes  MONITORING/EVALUATION(Goals): TNA to meet >90% of estimated nutritional needs  EDUCATION NEEDS: -Education needs addressed - discussed how TNA will provide nutrition and answered questions   INTERVENTION: TNA per pharmacy. Diet advancement per MD. Will monitor.   Dietitian #: [phone]  DOCUMENTATION  CODES Per approved criteria  -Not Applicable    Marshall Cork 09/06/2011, 3:12 PM

## 2011-09-06 NOTE — Progress Notes (Signed)
PARENTERAL NUTRITION CONSULT NOTE - INITIAL  Pharmacy Consult for TNA Indication: SBO  No Known Allergies  Patient Measurements: Height: 4\' 11"  (149.9 cm) Weight: 143 lb (64.864 kg) IBW/kg (Calculated) : 43.2  Adjusted Body Weight: 53kg   Vital Signs: Temp: 98.5 F (36.9 C) (02/20 0615) Temp src: Oral (02/20 0615) BP: 110/70 mmHg (02/20 0615) Pulse Rate: 114  (02/20 0615) Intake/Output from previous day: 02/19 0701 - 02/20 0700 In: 4854 [I.V.:4824; NG/GT:30] Out: 3860 [Urine:1675; Emesis/NG output:610; Blood:75] Intake/Output from this shift:    Labs:  Basename 09/06/11 0345 09/05/11 0345 09/04/11 0948  WBC 5.1 3.9* 6.1  HGB 12.7 12.6 13.2  HCT 37.8 37.9 38.8  PLT 173 160 151  APTT -- -- --  INR -- -- --     Basename 09/06/11 0345 09/05/11 0345 09/04/11 0949  NA 134* 132* 131*  K 4.9 4.7 4.8  CL 100 98 98  CO2 29 28 26   GLUCOSE 115* 146* 137*  BUN 9 8 13   CREATININE 1.01 0.90 0.84  LABCREA -- -- --  CREAT24HRUR -- -- --  CALCIUM 8.0* 8.6 8.7  MG -- -- --  PHOS -- -- --  PROT -- -- --  ALBUMIN -- -- --  AST -- -- --  ALT -- -- --  ALKPHOS -- -- --  BILITOT -- -- --  BILIDIR -- -- --  IBILI -- -- --  PREALBUMIN -- -- --  TRIG -- -- --  CHOLHDL -- -- --  CHOL -- -- --   Estimated Creatinine Clearance: 49.1 ml/min (by C-G formula based on Cr of 1.01).     Medical History: Past Medical History  Diagnosis Date  . Mitral valve prolapse   . Smoking history     Medications:  Scheduled:    . heparin  5,000 Units Subcutaneous Q8H  . morphine   Intravenous Q4H  . pantoprazole (PROTONIX) IV  40 mg Intravenous QHS  . piperacillin-tazobactam (ZOSYN)  IV  3.375 g Intravenous Q8H  . DISCONTD: acetaminophen  1,000 mg Intravenous Q6H  . DISCONTD: ampicillin-sulbactam (UNASYN) IV  3 g Intravenous Once  . DISCONTD: chlorhexidine  1 application Topical Once   Infusions: NS 125 ml/hr   Current Nutrition:  None x4 days prior to  surgery  Assessment:  61 yo F admitted with nausea, vomiting, abdominal pain on 2/16, CT showed evidence of SBO  Underwent ex lap, lysis of adhesions, small bowel resection with primary anastomosis on 2/19  To begin TNA today for prolonged ileus  Nutritional Goals:  RD assess pending For now: TNA at 33ml/hr + 20% lipids at 49ml/hr 72 gram protein per day 1502 kcal per day on lipid days MWF 1022 kcal per day on non-lipid days TThSS 1228 kcal average per day  Plan:  At 1800 today:  Start Clinimix E 5/15 at 42ml/hr + fat emulsion at 80ml/hr(fat emulsion only on MWF due to ongoing shortage).  Plan to advance as tolerated to a goal rate of 63ml/hr + 72g/day protein and 1502Kcal/day MWF, 1022Kcal/day STTHS(Avg. 1228Kcal/day).  TNA to contain standard multivitamins and trace elements(Only on MWF due to ongoing shortage).  Reduce IVF to 1ml/hr to maintain 125 ml/hr  Add CBGs q8h (no SSI unless CBGs >150)  TNA lab panels on Mondays & Thursdays.  Loralee Pacas, PharmD, BCPS Pager: [phone] 09/06/2011,8:50 AM

## 2011-09-07 LAB — CBC
HCT: 28.2 % — ABNORMAL LOW (ref 36.0–46.0)
Hemoglobin: 9.4 g/dL — ABNORMAL LOW (ref 12.0–15.0)
MCH: 29 pg (ref 26.0–34.0)
MCHC: 33.3 g/dL (ref 30.0–36.0)
MCV: 87 fL (ref 78.0–100.0)
Platelets: 133 10*3/uL — ABNORMAL LOW (ref 150–400)
RBC: 3.24 MIL/uL — ABNORMAL LOW (ref 3.87–5.11)
RDW: 13.7 % (ref 11.5–15.5)
WBC: 5.4 10*3/uL (ref 4.0–10.5)

## 2011-09-07 LAB — COMPREHENSIVE METABOLIC PANEL
ALT: 21 U/L (ref 0–35)
AST: 27 U/L (ref 0–37)
Albumin: 2.1 g/dL — ABNORMAL LOW (ref 3.5–5.2)
Alkaline Phosphatase: 40 U/L (ref 39–117)
BUN: 13 mg/dL (ref 6–23)
CO2: 31 mEq/L (ref 19–32)
Calcium: 8 mg/dL — ABNORMAL LOW (ref 8.4–10.5)
Chloride: 100 mEq/L (ref 96–112)
Creatinine, Ser: 0.78 mg/dL (ref 0.50–1.10)
GFR calc Af Amer: >90 mL/min (ref >90)
GFR calc non Af Amer: 90 mL/min — ABNORMAL LOW (ref >90)
Glucose, Bld: 139 mg/dL — ABNORMAL HIGH (ref 70–99)
Potassium: 3.6 mEq/L (ref 3.5–5.1)
Sodium: 135 mEq/L (ref 135–145)
Total Bilirubin: 0.3 mg/dL (ref 0.3–1.2)
Total Protein: 4.9 g/dL — ABNORMAL LOW (ref 6.0–8.3)

## 2011-09-07 LAB — CHOLESTEROL, TOTAL: Cholesterol: 102 mg/dL (ref 0–200)

## 2011-09-07 LAB — DIFFERENTIAL
Basophils Absolute: 0 10*3/uL (ref 0.0–0.1)
Basophils Relative: 0 % (ref 0–1)
Eosinophils Absolute: 0 10*3/uL (ref 0.0–0.7)
Eosinophils Relative: 0 % (ref 0–5)
Lymphocytes Relative: 13 % (ref 12–46)
Lymphs Abs: 0.7 10*3/uL (ref 0.7–4.0)
Monocytes Absolute: 0.6 10*3/uL (ref 0.1–1.0)
Monocytes Relative: 10 % (ref 3–12)
Neutro Abs: 4.1 10*3/uL (ref 1.7–7.7)
Neutrophils Relative %: 76 % (ref 43–77)

## 2011-09-07 LAB — MAGNESIUM: Magnesium: 2 mg/dL (ref 1.5–2.5)

## 2011-09-07 LAB — GLUCOSE, CAPILLARY
Glucose-Capillary: 135 mg/dL — ABNORMAL HIGH (ref 70–99)
Glucose-Capillary: 140 mg/dL — ABNORMAL HIGH (ref 70–99)

## 2011-09-07 LAB — PHOSPHORUS: Phosphorus: 2.3 mg/dL (ref 2.3–4.6)

## 2011-09-07 LAB — TRIGLYCERIDES: Triglycerides: 65 mg/dL (ref <150)

## 2011-09-07 LAB — PREALBUMIN: Prealbumin: 8 mg/dL — ABNORMAL LOW (ref 17.0–34.0)

## 2011-09-07 MED ORDER — CLINIMIX E/DEXTROSE (5/15) 5 % IV SOLN
INTRAVENOUS | Status: AC
  Administered 2011-09-07: 18:00:00 via INTRAVENOUS
  Filled 2011-09-07: qty 2000

## 2011-09-07 MED ORDER — SODIUM CHLORIDE 0.9 % IV SOLN
INTRAVENOUS | Status: AC
  Administered 2011-09-08: 05:00:00 via INTRAVENOUS

## 2011-09-07 NOTE — Progress Notes (Signed)
Patient ID: Savannah Bell, female   DOB: [birth year], 61 y.o.   MRN: [id number] 2 Days Post-Op  Subjective: Pt feels ok.  Having some pain, but well controlled.  No flatus yet.  Objective: Vital signs in last 24 hours: Temp:  [98 F (36.7 C)-99.6 F (37.6 C)] 98.5 F (36.9 C) (02/21 0559) Pulse Rate:  [100-120] 103  (02/21 0559) Resp:  [16-18] 16  (02/21 0806) BP: (94-110)/(61-70) 110/70 mmHg (02/21 0559) SpO2:  [95 %-100 %] 99 % (02/21 0806) Last BM Date: 09/04/11  Intake/Output from previous day: 02/20 0701 - 02/21 0700 In: 2032.9 [I.V.:1430.4; NG/GT:20; TPN:582.5] Out: 1805 [Urine:1505; Emesis/NG output:300] Intake/Output this shift: Total I/O In: 20 [NG/GT:20] Out: -   PE: Abd: soft, -BS, minimal distention, wound VAC in place, appropriately tender Heart: regular, tachy Lungs: CTAB  Lab Results:   Basename 09/07/11 0500 09/06/11 0345  WBC 5.4 5.1  HGB 9.4* 12.7  HCT 28.2* 37.8  PLT 133* 173   BMET  Basename 09/07/11 0500 09/06/11 0345  NA 135 134*  K 3.6 4.9  CL 100 100  CO2 31 29  GLUCOSE 139* 115*  BUN 13 9  CREATININE 0.78 1.01  CALCIUM 8.0* 8.0*   PT/INR No results found for this basename: LABPROT:2,INR:2 in the last 72 hours   Studies/Results: Dg Chest Port 1 View  09/06/2011  *RADIOLOGY REPORT*  Clinical Data: 61 year old female status post PICC placement.  PORTABLE CHEST - 1 VIEW  Comparison: None.  Findings: AP portable semi upright views of the chest at 1244 hours.  Enteric tube in place, side hole the level of the gastroesophageal junction. Right upper extremity approach PICC line catheter, tip at the level of the cavoatrial junction.  Increased retrocardiac opacity.  No pneumothorax, pulmonary edema or effusion.  Cardiac size and mediastinal contours are within normal limits.    IMPRESSION: 1. Right upper extremity approach PICC line catheter, tip at the level of the cavoatrial junction. 2.  Recommend advancing enteric tube 5 cm to guarantee  side hole placement within the stomach. 3.  Confluent retrocardiac opacity could reflect atelectasis or pneumonia.  Original Report Authenticated By: Harley Hallmark, M.D.    Anti-infectives: Anti-infectives     Start     Dose/Rate Route Frequency Ordered Stop   09/05/11 2000  piperacillin-tazobactam (ZOSYN) IVPB 3.375 g       3.375 g 12.5 mL/hr over 240 Minutes Intravenous 3 times per day 09/05/11 1910     09/05/11 1145   Ampicillin-Sulbactam (UNASYN) 3 g in sodium chloride 0.9 % 100 mL IVPB  Status:  Discontinued     Comments: preop      3 g 100 mL/hr over 60 Minutes Intravenous  Once 09/05/11 1142 09/05/11 1854           Assessment/Plan  1. S/p ex lap with LOA and SBR for SBO with ischemic bowel 2. Post-op ileus  3. ABL anemia  Plan: 1. Will check labs in the morning and follow H/H as it has dropped from 12.7 to 9.4. 2. Cont NPO/NGT and await bowel function. 3. Change VAC dressing today.  LOS: 5 days    , E 09/07/2011

## 2011-09-07 NOTE — Progress Notes (Signed)
Expected ileus, otherwise well, follow hct no evidence bleeding, cont tna/ngt, wound is clean and vac replaced today, pulm toilet

## 2011-09-07 NOTE — Progress Notes (Signed)
PARENTERAL NUTRITION CONSULT NOTE - FOLLOW UP  Pharmacy Consult for TNA Indication: Prolonged ileus, SBO  No Known Allergies  Patient Measurements: Height: 4\' 11"  (149.9 cm) Weight: 143 lb (64.864 kg) IBW/kg (Calculated) : 43.2  Adjusted Body Weight: 53kg Usual Weight: 64.8kg  Vital Signs: Temp: 98.5 F (36.9 C) (02/21 0559) Temp src: Oral (02/21 0559) BP: 110/70 mmHg (02/21 0559) Pulse Rate: 103  (02/21 0559) Intake/Output from previous day: 02/20 0701 - 02/21 0700 In: 2032.9 [I.V.:1430.4; NG/GT:20; TPN:582.5] Out: 1805 [Urine:1505; Emesis/NG output:300] Intake/Output from this shift: NG output 300cc/24hr NPO  Labs:  Basename 09/07/11 0500 09/06/11 0345 09/05/11 0345  WBC 5.4 5.1 3.9*  HGB 9.4* 12.7 12.6  HCT 28.2* 37.8 37.9  PLT 133* 173 160  APTT -- -- --  INR -- -- --     Basename 09/07/11 0500 09/06/11 0345 09/05/11 0345  NA 135 134* 132*  K 3.6 4.9 4.7  CL 100 100 98  CO2 31 29 28   GLUCOSE 139* 115* 146*  BUN 13 9 8   CREATININE 0.78 1.01 0.90  LABCREA -- -- --  CREAT24HRUR -- -- --  CALCIUM 8.0* 8.0* 8.6  MG 2.0 -- --  PHOS 2.3 -- --  PROT 4.9* -- --  ALBUMIN 2.1* -- --  AST 27 -- --  ALT 21 -- --  ALKPHOS 40 -- --  BILITOT 0.3 -- --  BILIDIR -- -- --  IBILI -- -- --  PREALBUMIN -- -- --  TRIG -- -- --  CHOLHDL -- -- --  CHOL -- -- --   Estimated Creatinine Clearance: 62 ml/min (by C-G formula based on Cr of 0.78).    Basename 09/07/11 0649 09/06/11 2300 09/06/11 1924  GLUCAP 135* 146* 130*    Medications:  Scheduled:    . heparin  5,000 Units Subcutaneous Q8H  . morphine   Intravenous Q4H  . pantoprazole (PROTONIX) IV  40 mg Intravenous QHS  . piperacillin-tazobactam (ZOSYN)  IV  3.375 g Intravenous Q8H    Insulin Requirements in the past 24 hours:  None ordered  Current Nutrition:  NPO Clinimix E 5/15 at 40 ml/hr Lipids 20% at 55ml/hr NS at 41ml/hr  Assessment: 61 yo F admitted with nausea, vomiting, abdominal pain on  2/16, CT showed evidence of SBO  Underwent ex lap, lysis of adhesions, small bowel resection with primary anastomosis on 2/19  TNA initiated 2/19 K+ at low end of goal, expect to normalize with TNA rate advancement Based on RD assessment, will revise goal rate to meet estimated needs  Nutritional Goals:  Kcal:1550-1750  Protein:95-115g   Plan:  At 1800 today:  Advance Clinimix E 5/15 to 56ml/hr   Fat emulsion only on MWF due to ongoing shortage  Plan to advance as tolerated to a goal rate of 3ml/hr + Fat 19ml/hr to provide: 96g/day protein and 1843 Kcal/day MWF,  1363 Kcal/day STTHS (Avg 1569 Kcal/day).  TNA to contain standard multivitamins and trace elements(Only on MWF due to ongoing shortage).  Reduce IVF to 65 ml/hr to maintain 125 ml/hr  CBGs acceptable - Continue CBG q8h (SSI coverage only if >150)   TNA lab panels on Mondays & Thursdays.  Prealbumin & triglycerides pending   Loralee Pacas, PharmD, BCPS Pager: [phone] 09/07/2011,7:19 AM

## 2011-09-08 LAB — CBC
HCT: 27 % — ABNORMAL LOW (ref 36.0–46.0)
Hemoglobin: 9 g/dL — ABNORMAL LOW (ref 12.0–15.0)
MCH: 29.1 pg (ref 26.0–34.0)
MCHC: 33.3 g/dL (ref 30.0–36.0)
MCV: 87.4 fL (ref 78.0–100.0)
Platelets: 132 10*3/uL — ABNORMAL LOW (ref 150–400)
RBC: 3.09 MIL/uL — ABNORMAL LOW (ref 3.87–5.11)
RDW: 13.6 % (ref 11.5–15.5)
WBC: 5 10*3/uL (ref 4.0–10.5)

## 2011-09-08 LAB — GLUCOSE, CAPILLARY
Glucose-Capillary: 142 mg/dL — ABNORMAL HIGH (ref 70–99)
Glucose-Capillary: 137 mg/dL — ABNORMAL HIGH (ref 70–99)

## 2011-09-08 LAB — BASIC METABOLIC PANEL
BUN: 12 mg/dL (ref 6–23)
CO2: 30 mEq/L (ref 19–32)
Calcium: 7.8 mg/dL — ABNORMAL LOW (ref 8.4–10.5)
Chloride: 99 mEq/L (ref 96–112)
Creatinine, Ser: 0.71 mg/dL (ref 0.50–1.10)
GFR calc Af Amer: >90 mL/min (ref >90)
GFR calc non Af Amer: >90 mL/min (ref >90)
Glucose, Bld: 130 mg/dL — ABNORMAL HIGH (ref 70–99)
Potassium: 3.2 mEq/L — ABNORMAL LOW (ref 3.5–5.1)
Sodium: 134 mEq/L — ABNORMAL LOW (ref 135–145)

## 2011-09-08 MED ORDER — POTASSIUM CHLORIDE 10 MEQ/100ML IV SOLN
10.0000 meq | INTRAVENOUS | Status: AC
  Administered 2011-09-08 (×3): 10 meq via INTRAVENOUS
  Filled 2011-09-08 (×3): qty 100

## 2011-09-08 MED ORDER — FAT EMULSION 20 % IV EMUL
250.0000 mL | INTRAVENOUS | Status: AC
  Administered 2011-09-08: 250 mL via INTRAVENOUS
  Filled 2011-09-08: qty 250

## 2011-09-08 MED ORDER — SODIUM CHLORIDE 0.9 % IV SOLN
INTRAVENOUS | Status: DC
  Administered 2011-09-09 – 2011-09-11 (×3): via INTRAVENOUS
  Administered 2011-09-11: 250 mL via INTRAVENOUS
  Administered 2011-09-12 – 2011-09-14 (×3): via INTRAVENOUS

## 2011-09-08 MED ORDER — M.V.I. ADULT IV INJ
INJECTION | INTRAVENOUS | Status: AC
  Administered 2011-09-08: 18:00:00 via INTRAVENOUS
  Filled 2011-09-08: qty 2000

## 2011-09-08 NOTE — Progress Notes (Signed)
Patient ID: Savannah Bell, female   DOB: 03-17-1951, 61 y.o.   MRN: [id number] 3 Days Post-Op  Subjective: Pt without complaints.  Abdominal pain well controlled.  OOB ambulating.  No flatus yet  Objective: Vital signs in last 24 hours: Temp:  [98.4 F (36.9 C)-99.5 F (37.5 C)] 99.1 F (37.3 C) (02/22 0630) Pulse Rate:  [99-101] 101  (02/22 0630) Resp:  [16-18] 16  (02/22 0831) BP: (105-113)/(69-74) 111/72 mmHg (02/22 0630) SpO2:  [94 %-99 %] 98 % (02/22 0831) Last BM Date: 09/04/11  Intake/Output from previous day: 02/21 0701 - 02/22 0700 In: 2791 [I.V.:1470; NG/GT:20; IV Piggyback:224; TPN:1077] Out: 2775 [Urine:2100; Emesis/NG output:650; Drains:25] Intake/Output this shift:    PE: Abd: soft, VAC in place, appropriately tender, -BS, ND, NGT with some bilious output.  Lab Results:   Basename 09/08/11 0440 09/07/11 0500  WBC 5.0 5.4  HGB 9.0* 9.4*  HCT 27.0* 28.2*  PLT 132* 133*   BMET  Basename 09/08/11 0440 09/07/11 0500  NA 134* 135  K 3.2* 3.6  CL 99 100  CO2 30 31  GLUCOSE 130* 139*  BUN 12 13  CREATININE 0.71 0.78  CALCIUM 7.8* 8.0*   PT/INR No results found for this basename: LABPROT:2,INR:2 in the last 72 hours   Studies/Results: Dg Chest Port 1 View  09/06/2011  *RADIOLOGY REPORT*  Clinical Data: 61 year old female status post PICC placement.  PORTABLE CHEST - 1 VIEW  Comparison: None.  Findings: AP portable semi upright views of the chest at 1244 hours.  Enteric tube in place, side hole the level of the gastroesophageal junction. Right upper extremity approach PICC line catheter, tip at the level of the cavoatrial junction.  Increased retrocardiac opacity.  No pneumothorax, pulmonary edema or effusion.  Cardiac size and mediastinal contours are within normal limits.    IMPRESSION: 1. Right upper extremity approach PICC line catheter, tip at the level of the cavoatrial junction. 2.  Recommend advancing enteric tube 5 cm to guarantee side hole  placement within the stomach. 3.  Confluent retrocardiac opacity could reflect atelectasis or pneumonia.  Original Report Authenticated By: Harley Hallmark, M.D.    Anti-infectives: Anti-infectives     Start     Dose/Rate Route Frequency Ordered Stop   09/05/11 2000  piperacillin-tazobactam (ZOSYN) IVPB 3.375 g       3.375 g 12.5 mL/hr over 240 Minutes Intravenous 3 times per day 09/05/11 1910     09/05/11 1145   Ampicillin-Sulbactam (UNASYN) 3 g in sodium chloride 0.9 % 100 mL IVPB  Status:  Discontinued     Comments: preop      3 g 100 mL/hr over 60 Minutes Intravenous  Once 09/05/11 1142 09/05/11 1854           Assessment/Plan  1. S/p ex lap with LOA, SBR for ischemia secondary to SBO 2. Post op ileus 3. ABL anemia 4. PCM/TNA  Plan: 1. Cont NGT and await bowel function. 2. Follow hgb but is hopefully stabilizing out. 3. Cont TNA for nutritional support   LOS: 6 days    , E 09/08/2011

## 2011-09-08 NOTE — Progress Notes (Signed)
CARE MANAGEMENT NOTE 09/08/2011  Patient:  Savannah Bell, Savannah Bell   Account Number:  [id number]  Date Initiated:  09/08/2011  Documentation initiated by:  Edd Arbour  Subjective/Objective Assessment:   75 F admittedon 09/02/11 fro n/v abdominal pain DX SBO per Dr Sheron Nightingale with resolving ileus PCP [name] Lived alone with family support upon admissoin Expecting d/c home     Action/Plan:   Anticipated DC Date:     Anticipated DC Plan:           Choice offered to / List presented to:             Status of service:  In process, will continue to follow Medicare Important Message given?  NO (If response is "NO", the following Medicare IM given date fields will be blank) Date Medicare IM given:   Date Additional Medicare IM given:    Discharge Disposition:    Per UR Regulation:  Reviewed for med. necessity/level of care/duration of stay  Comments:  09/08/11 1617 Manya Silvas, CCM [phone] F admitted on 09/02/11 fro n/v abdominal pain DX SBO with post op ileus per Dr Sheron Nightingale, s/p ex lap with LOA and SBR with ischemic bowel PCP [name] Lived alone with family support Expecting d/c home 09/08/11 Attending MD note states awaiting ileus resolve. No d/c planning orders noted in EPIC. CM went to visit pt but she is asleep NGT remains intact.  Will continue to be followed

## 2011-09-08 NOTE — Progress Notes (Addendum)
PARENTERAL NUTRITION CONSULT NOTE - FOLLOW UP  Pharmacy Consult for TNA Indication: Prolonged ileus, SBO  No Known Allergies  Patient Measurements: Height: 4\' 11"  (149.9 cm) Weight: 143 lb (64.864 kg) IBW/kg (Calculated) : 43.2  Adjusted Body Weight: 53kg Usual Weight: 64.8kg  Vital Signs: Temp: 99.1 F (37.3 C) (02/22 0630) Temp src: Oral (02/22 0630) BP: 111/72 mmHg (02/22 0630) Pulse Rate: 101  (02/22 0630) Intake/Output from previous day: 02/21 0701 - 02/22 0700 In: 2791 [I.V.:1470; NG/GT:20; IV Piggyback:224; TPN:1077] Out: 2775 [Urine:2100; Emesis/NG output:650; Drains:25] Intake/Output from this shift: NG output 300cc/24hr NPO  Labs:  [facility] 09/08/11 0440 09/07/11 0500 09/06/11 0345  WBC 5.0 5.4 5.1  HGB 9.0* 9.4* 12.7  HCT 27.0* 28.2* 37.8  PLT 132* 133* 173  APTT -- -- --  INR -- -- --     Basename 09/08/11 0440 09/07/11 0500 09/06/11 0345  NA 134* 135 134*  K 3.2* 3.6 4.9  CL 99 100 100  CO2 30 31 29   GLUCOSE 130* 139* 115*  BUN 12 13 9   CREATININE 0.71 0.78 1.01  LABCREA -- -- --  CREAT24HRUR -- -- --  CALCIUM 7.8* 8.0* 8.0*  MG -- 2.0 --  PHOS -- 2.3 --  PROT -- 4.9* --  ALBUMIN -- 2.1* --  AST -- 27 --  ALT -- 21 --  ALKPHOS -- 40 --  BILITOT -- 0.3 --  BILIDIR -- -- --  IBILI -- -- --  PREALBUMIN -- 8.0* --  TRIG -- 65 --  CHOLHDL -- -- --  CHOL -- 102 --   Estimated Creatinine Clearance: 62 ml/min (by C-G formula based on Cr of 0.71).    Basename 09/08/11 0617 09/07/11 2202 09/07/11 0649  GLUCAP 142* 140* 135*    Medications:  Scheduled:     . heparin  5,000 Units Subcutaneous Q8H  . morphine   Intravenous Q4H  . pantoprazole (PROTONIX) IV  40 mg Intravenous QHS  . piperacillin-tazobactam (ZOSYN)  IV  3.375 g Intravenous Q8H    Insulin Requirements in the past 24 hours:  None ordered  Current Nutrition:  NPO Clinimix E 5/15 at 60 ml/hr NS at 69ml/hr  Assessment: 61 yo F admitted with nausea, vomiting,  abdominal pain on 2/16, CT showed evidence of SBO  Underwent ex lap, lysis of adhesions, small bowel resection with primary anastomosis on 2/19  TNA initiated 2/19 K+ at low end of goal 3.2, will need supplementation today Prealbumin low 8.0 likely reflective of acute illness Appears to be tolerating TNA advancement No weight recorded since 2/16, will ask RN to try to weigh today  Nutritional Goals:  Kcal:1550-1750  Protein:95-115g   Plan:  At 1800 today:  Advance Clinimix E 5/15 to goal 61ml/hr today  Fat emulsion 10 ml/hr only on MWF due to ongoing shortage  Goal rates will provide 96g/day protein and 1843 Kcal/day MWF, 1363 Kcal/day STTHS (Avg 1569 Kcal/day).  TNA to contain standard multivitamins and trace elements(Only on MWF due to ongoing shortage).  Reduce NS IVF to 45 ml/hr to maintain 125 ml/hr  CBGs acceptable - Continue CBG q8h (SSI coverage only if >150)   KCl 10 meq IV x 3 runs today  BMET in am  TNA lab panels on Mondays & Thursdays.  Follow resolution of ileus/sbo and ability to advance diet  D#4 Zosyn 3.375gm IV q8h (4hr extended infusions)  for empiric abdominal post-op coverage. Tm 99.1 this am, WBC 5.1, Scr improving, CrCl ~65, no cultures, wound  VAC changed yesterday.  No change to dose as renal function is stable.  Loralee Pacas, PharmD, BCPS Pager: [phone] 09/08/2011,7:02 AM

## 2011-09-08 NOTE — Progress Notes (Signed)
Await ileus resolve, still with bilious ng output but has some bs today, follow hct, 7 days abx due to operative findings.

## 2011-09-08 NOTE — Progress Notes (Signed)
Pt is alert and oriented, vital signs are stable, NG tube with output of , wound vac with scant amount of drainage no leakage and is intact, pt with adequate urine output this shift, pt is belching bowel sounds are hypoactive but no flatus yet, pt is up and ambulating independantly, will continue to monitor Means,  N 09-08-11 16:36pm

## 2011-09-09 LAB — CBC
HCT: 27 % — ABNORMAL LOW (ref 36.0–46.0)
Hemoglobin: 9.1 g/dL — ABNORMAL LOW (ref 12.0–15.0)
MCH: 28.9 pg (ref 26.0–34.0)
MCHC: 33.7 g/dL (ref 30.0–36.0)
MCV: 85.7 fL (ref 78.0–100.0)
Platelets: 175 10*3/uL (ref 150–400)
RBC: 3.15 MIL/uL — ABNORMAL LOW (ref 3.87–5.11)
RDW: 13 % (ref 11.5–15.5)
WBC: 6.3 10*3/uL (ref 4.0–10.5)

## 2011-09-09 LAB — GLUCOSE, CAPILLARY
Glucose-Capillary: 163 mg/dL — ABNORMAL HIGH (ref 70–99)
Glucose-Capillary: 144 mg/dL — ABNORMAL HIGH (ref 70–99)
Glucose-Capillary: 130 mg/dL — ABNORMAL HIGH (ref 70–99)
Glucose-Capillary: 151 mg/dL — ABNORMAL HIGH (ref 70–99)

## 2011-09-09 MED ORDER — INSULIN ASPART 100 UNIT/ML ~~LOC~~ SOLN
0.0000 [IU] | Freq: Four times a day (QID) | SUBCUTANEOUS | Status: DC
  Administered 2011-09-09 (×2): 1 [IU] via SUBCUTANEOUS
  Administered 2011-09-09: 2 [IU] via SUBCUTANEOUS
  Administered 2011-09-10 (×2): 1 [IU] via SUBCUTANEOUS
  Administered 2011-09-10 – 2011-09-11 (×3): 2 [IU] via SUBCUTANEOUS
  Filled 2011-09-09: qty 3

## 2011-09-09 MED ORDER — CLINIMIX E/DEXTROSE (5/15) 5 % IV SOLN
INTRAVENOUS | Status: AC
  Administered 2011-09-09: 17:00:00 via INTRAVENOUS
  Filled 2011-09-09: qty 2000

## 2011-09-09 NOTE — Progress Notes (Signed)
Patient ID: Savannah Bell, female   DOB: [birth year], 61 y.o.   MRN: [id number] 4 Days Post-Op  Subjective: No complaints. Denies pain. Has felt some rumbling in her abdomen but no flatus or bowel movement yet.  Objective: Vital signs in last 24 hours: Temp:  [98.3 F (36.8 C)-98.9 F (37.2 C)] 98.4 F (36.9 C) (02/23 0520) Pulse Rate:  [90-100] 90  (02/23 0520) Resp:  [16-18] 16  (02/23 1112) BP: (118-142)/(73-83) 118/73 mmHg (02/23 0520) SpO2:  [95 %-99 %] 99 % (02/23 1112) Last BM Date: 09/04/11  Intake/Output from previous day: 02/22 0701 - 02/23 0700 In: 1540 [I.V.:450; IV Piggyback:100; TPN:990] Out: 2150 [Urine:1200; Emesis/NG output:950] Intake/Output this shift: Total I/O In: -  Out: 400 [Urine:400]  General appearance: alert and no distress GI: normal findings: soft, non-tender Incision/Wound:VAC in place, appears clean without signs of infection  Lab Results:   [facility] 09/09/11 0605 09/08/11 0440  WBC 6.3 5.0  HGB 9.1* 9.0*  HCT 27.0* 27.0*  PLT 175 132*   BMET  Basename 09/08/11 0440 09/07/11 0500  NA 134* 135  K 3.2* 3.6  CL 99 100  CO2 30 31  GLUCOSE 130* 139*  BUN 12 13  CREATININE 0.71 0.78  CALCIUM 7.8* 8.0*     Studies/Results: No results found.  Anti-infectives: Anti-infectives     Start     Dose/Rate Route Frequency Ordered Stop   09/05/11 2000  piperacillin-tazobactam (ZOSYN) IVPB 3.375 g       3.375 g 12.5 mL/hr over 240 Minutes Intravenous 3 times per day 09/05/11 1910     09/05/11 1145   Ampicillin-Sulbactam (UNASYN) 3 g in sodium chloride 0.9 % 100 mL IVPB  Status:  Discontinued     Comments: preop      3 g 100 mL/hr over 60 Minutes Intravenous  Once 09/05/11 1142 09/05/11 1854          Assessment/Plan: s/p Procedure(s): EXPLORATORY LAPAROTOMY LYSIS OF ADHESION SMALL BOWEL RESECTION APPLICATION OF WOUND VAC Appears stable without complication. She has an expected ileus. Continue TNA and NG suction. Hypokalemia.  On TNA. Will check electrolytes in the morning.   LOS: 7 days    HOXWORTH,BENJAMIN T 09/09/2011

## 2011-09-09 NOTE — Progress Notes (Signed)
PARENTERAL NUTRITION CONSULT NOTE - FOLLOW UP  Pharmacy Consult for TNA Indication: Prolonged ileus, SBO  No Known Allergies  Patient Measurements: Height: 4\' 11"  (149.9 cm) Weight: 143 lb (64.864 kg) IBW/kg (Calculated) : 43.2  Adjusted Body Weight: 53kg Usual Weight: 64.8kg  Vital Signs: Temp: 98.4 F (36.9 C) (02/23 0520) Temp src: Oral (02/23 0520) BP: 118/73 mmHg (02/23 0520) Pulse Rate: 90  (02/23 0520) Intake/Output from previous day: 02/22 0701 - 02/23 0700 In: 1540 [I.V.:450; IV Piggyback:100; TPN:990] Out: 2150 [Urine:1200; Emesis/NG output:950] Intake/Output from this shift: 1540/2150 (-610) NG Output 950 ml 2/22 <--650 (2/21)  Labs:  Basename 09/09/11 0605 09/08/11 0440 09/07/11 0500  WBC 6.3 5.0 5.4  HGB 9.1* 9.0* 9.4*  HCT 27.0* 27.0* 28.2*  PLT 175 132* 133*  APTT -- -- --  INR -- -- --     Basename 09/08/11 0440 09/07/11 0500  NA 134* 135  K 3.2* 3.6  CL 99 100  CO2 30 31  GLUCOSE 130* 139*  BUN 12 13  CREATININE 0.71 0.78  LABCREA -- --  CREAT24HRUR -- --  CALCIUM 7.8* 8.0*  MG -- 2.0  PHOS -- 2.3  PROT -- 4.9*  ALBUMIN -- 2.1*  AST -- 27  ALT -- 21  ALKPHOS -- 40  BILITOT -- 0.3  BILIDIR -- --  IBILI -- --  PREALBUMIN -- 8.0*  TRIG -- 65  CHOLHDL -- --  CHOL -- 102   Estimated Creatinine Clearance: 62 ml/min (by C-G formula based on Cr of 0.71).    Basename 09/09/11 0658 09/08/11 2159 09/08/11 0617  GLUCAP 163* 137* 142*    Medications:  Scheduled:     . heparin  5,000 Units Subcutaneous Q8H  . morphine   Intravenous Q4H  . pantoprazole (PROTONIX) IV  40 mg Intravenous QHS  . piperacillin-tazobactam (ZOSYN)  IV  3.375 g Intravenous Q8H  . potassium chloride  10 mEq Intravenous Q1 Hr x 3    Insulin Requirements in the past 24 hours:  None ordered  Current Nutrition:  NPO Clinimix E 5/15 at 80 ml/hr (Goal) Lipid 20% @ 85ml/hr MWF only due to national shortage NS at 51ml/hr  Assessment: 61 yo F admitted with  nausea, vomiting, abdominal pain on 2/16, CT showed evidence of SBO  Underwent ex lap, lysis of adhesions, small bowel resection with primary anastomosis on 2/19  TNA initiated 2/19. Tolerating TNA @ goal rate 80 ml/hr, Lipids MWF @ 8ml/hr K+ corrected and now wnl after supplementation 2/22 Prealbumin low 8.0 likely reflective of acute illness No weight recorded since 2/16 CBG>150  Nutritional Goals:  Per RD assessment:  Kcal:1550-1750   Protein:95-115g  TNA @ goal to provide 96g/day protein and 1843 Kcal/day MWF, 1363 Kcal/day STTHS (Avg 1569 Kcal/day).  Plan:   Continue Clinimix E 5/15 to goal 66ml/hr today  Fat emulsion 10 ml/hr only on MWF due to ongoing shortage  MVI/TE MWF only due to ongoing shortage  Daily weights  Add SSI coverage   TNA lab panels on Mondays & Thursdays.  Follow resolution of ileus/sbo and ability to advance diet  Gwen Her PharmD  [phone] 09/09/2011 9:08 AM

## 2011-09-10 LAB — BASIC METABOLIC PANEL
BUN: 11 mg/dL (ref 6–23)
CO2: 28 mEq/L (ref 19–32)
Calcium: 8.1 mg/dL — ABNORMAL LOW (ref 8.4–10.5)
Chloride: 98 mEq/L (ref 96–112)
Creatinine, Ser: 0.56 mg/dL (ref 0.50–1.10)
GFR calc Af Amer: >90 mL/min (ref >90)
GFR calc non Af Amer: >90 mL/min (ref >90)
Glucose, Bld: 143 mg/dL — ABNORMAL HIGH (ref 70–99)
Potassium: 3.3 mEq/L — ABNORMAL LOW (ref 3.5–5.1)
Sodium: 134 mEq/L — ABNORMAL LOW (ref 135–145)

## 2011-09-10 LAB — GLUCOSE, CAPILLARY
Glucose-Capillary: 155 mg/dL — ABNORMAL HIGH (ref 70–99)
Glucose-Capillary: 146 mg/dL — ABNORMAL HIGH (ref 70–99)
Glucose-Capillary: 143 mg/dL — ABNORMAL HIGH (ref 70–99)

## 2011-09-10 MED ORDER — POTASSIUM CHLORIDE 10 MEQ/100ML IV SOLN
10.0000 meq | INTRAVENOUS | Status: AC
  Administered 2011-09-10 (×4): 10 meq via INTRAVENOUS
  Filled 2011-09-10 (×4): qty 100

## 2011-09-10 MED ORDER — CLINIMIX E/DEXTROSE (5/15) 5 % IV SOLN
INTRAVENOUS | Status: AC
  Administered 2011-09-10: 18:00:00 via INTRAVENOUS
  Filled 2011-09-10: qty 2000

## 2011-09-10 NOTE — Progress Notes (Signed)
Patient ID: Savannah Bell, female   DOB: 13-May-1951, 61 y.o.   MRN: [id number] 5 Days Post-Op  Subjective: No complaints. She has passed a little bit of flatus. No bowel movement yet. Denies abdominal pain. She did have some nausea when the NG was disconnected when she was up in a chair.  Objective: Vital signs in last 24 hours: Temp:  [98.1 F (36.7 C)-99.2 F (37.3 C)] 98.1 F (36.7 C) (02/24 0553) Pulse Rate:  [87-96] 94  (02/24 0553) Resp:  [16-18] 16  (02/24 0751) BP: (111-147)/(72-85) 111/72 mmHg (02/24 0553) SpO2:  [95 %-99 %] 99 % (02/24 0751) Weight:  [158 lb 8 oz (71.895 kg)] 158 lb 8 oz (71.895 kg) (02/24 0553) Last BM Date: 09/04/11  Intake/Output from previous day: 02/23 0701 - 02/24 0700 In: 3227.9 [I.V.:1097.3; NG/GT:30; IV Piggyback:150; TPN:1950.7] Out: 4050 [Urine:3350; Emesis/NG output:700] Intake/Output this shift: Total I/O In: -  Out: 650 [Urine:650]  General appearance: alert and no distress GI: normal findings: soft, non-tender and VAC clean and intact. Bilious NG drainage.  Lab Results:   [facility] 09/09/11 0605 09/08/11 0440  WBC 6.3 5.0  HGB 9.1* 9.0*  HCT 27.0* 27.0*  PLT 175 132*   BMET  Basename 09/10/11 0533 09/08/11 0440  NA 134* 134*  K 3.3* 3.2*  CL 98 99  CO2 28 30  GLUCOSE 143* 130*  BUN 11 12  CREATININE 0.56 0.71  CALCIUM 8.1* 7.8*     Studies/Results: No results found.  Anti-infectives: Anti-infectives     Start     Dose/Rate Route Frequency Ordered Stop   09/05/11 2000  piperacillin-tazobactam (ZOSYN) IVPB 3.375 g       3.375 g 12.5 mL/hr over 240 Minutes Intravenous 3 times per day 09/05/11 1910     09/05/11 1145   Ampicillin-Sulbactam (UNASYN) 3 g in sodium chloride 0.9 % 100 mL IVPB  Status:  Discontinued     Comments: preop      3 g 100 mL/hr over 60 Minutes Intravenous  Once 09/05/11 1142 09/05/11 1854          Assessment/Plan: s/p Procedure(s): EXPLORATORY LAPAROTOMY LYSIS OF ADHESION SMALL  BOWEL RESECTION APPLICATION OF WOUND VAC Stable postoperatively. Likely still some degree of ileus. Will leave NG today. Continue TNA Hypokalemia. Slightly improved and pharmacy is managing.   LOS: 8 days    HOXWORTH,BENJAMIN T 09/10/2011

## 2011-09-10 NOTE — Progress Notes (Signed)
PARENTERAL NUTRITION CONSULT NOTE - FOLLOW UP  Pharmacy Consult for TNA Indication: Prolonged ileus, SBO  No Known Allergies  Patient Measurements: Height: 4\' 11"  (149.9 cm) Weight: 158 lb 8 oz (71.895 kg) IBW/kg (Calculated) : 43.2  Adjusted Body Weight: 53kg Usual Weight: 64.8kg  Vital Signs: Temp: 98.1 F (36.7 C) (02/24 0553) Temp src: Oral (02/24 0553) BP: 111/72 mmHg (02/24 0553) Pulse Rate: 94  (02/24 0553) Intake/Output from previous day: 02/23 0701 - 02/24 0700 In: 3227.9 [I.V.:1097.3; NG/GT:30; IV Piggyback:150; TPN:1950.7] Out: 4050 [Urine:3350; Emesis/NG output:700] Intake/Output from this shift: 3227.03/4049 (-822.1) NG Output 700 ml  Labs:  [facility] 09/09/11 0605 09/08/11 0440  WBC 6.3 5.0  HGB 9.1* 9.0*  HCT 27.0* 27.0*  PLT 175 132*  APTT -- --  INR -- --     Basename 09/10/11 0533 09/08/11 0440  NA 134* 134*  K 3.3* 3.2*  CL 98 99  CO2 28 30  GLUCOSE 143* 130*  BUN 11 12  CREATININE 0.56 0.71  LABCREA -- --  CREAT24HRUR -- --  CALCIUM 8.1* 7.8*  MG -- --  PHOS -- --  PROT -- --  ALBUMIN -- --  AST -- --  ALT -- --  ALKPHOS -- --  BILITOT -- --  BILIDIR -- --  IBILI -- --  PREALBUMIN -- --  TRIG -- --  CHOLHDL -- --  CHOL -- --   Estimated Creatinine Clearance: 65.4 ml/min (by C-G formula based on Cr of 0.56).    Basename 09/09/11 2303 09/09/11 1729 09/09/11 1219  GLUCAP 151* 130* 144*    Medications:  Scheduled:     . heparin  5,000 Units Subcutaneous Q8H  . insulin aspart  0-9 Units Subcutaneous Q6H  . morphine   Intravenous Q4H  . pantoprazole (PROTONIX) IV  40 mg Intravenous QHS  . piperacillin-tazobactam (ZOSYN)  IV  3.375 g Intravenous Q8H  . potassium chloride  10 mEq Intravenous Q1 Hr x 4    Insulin Requirements in the past 24 hours:  6 units SSI  Current Nutrition:  NPO Clinimix E 5/15 at 80 ml/hr (Goal) Lipid 20% @ 14ml/hr MWF only due to national shortage NS at 71ml/hr  Assessment: 61 yo F  admitted with nausea, vomiting, abdominal pain on 2/16, CT showed evidence of SBO  Underwent ex lap, lysis of adhesions, small bowel resection with primary anastomosis on 2/19  TNA initiated 2/19. Tolerating TNA @ goal rate 80 ml/hr, Lipids MWF @ 88ml/hr K+ corrected 2/22, low again today Prealbumin low 8.0 2/21 likely reflective of acute illness CBG improved Wt up 158lb <-- 143lb (2/16)  Nutritional Goals:  Per RD assessment:  Kcal:1550-1750   Protein:95-115g  TNA @ goal to provide 96g/day protein and 1843 Kcal/day MWF, 1363 Kcal/day STTHS (Avg 1569 Kcal/day).  Plan:  KCL supplementation: IV q1h x 4, f/u AM labs   Continue Clinimix E 5/15 to goal 5ml/hr today  Fat emulsion 10 ml/hr only on MWF due to ongoing shortage  MVI/TE MWF only due to ongoing shortage  Daily weights  Cont SSI coverage   TNA lab panels on Mondays & Thursdays.  Follow resolution of ileus/sbo and ability to advance diet  Gwen Her PharmD  [phone] 09/10/2011 10:36 AM

## 2011-09-11 LAB — DIFFERENTIAL
Basophils Absolute: 0.1 10*3/uL (ref 0.0–0.1)
Basophils Relative: 1 % (ref 0–1)
Eosinophils Absolute: 0.1 10*3/uL (ref 0.0–0.7)
Eosinophils Relative: 1 % (ref 0–5)
Lymphocytes Relative: 15 % (ref 12–46)
Lymphs Abs: 1.4 10*3/uL (ref 0.7–4.0)
Monocytes Absolute: 1 10*3/uL (ref 0.1–1.0)
Monocytes Relative: 11 % (ref 3–12)
Neutro Abs: 6.8 10*3/uL (ref 1.7–7.7)
Neutrophils Relative %: 72 % (ref 43–77)

## 2011-09-11 LAB — CBC
HCT: 28.6 % — ABNORMAL LOW (ref 36.0–46.0)
Hemoglobin: 9.7 g/dL — ABNORMAL LOW (ref 12.0–15.0)
MCH: 28.4 pg (ref 26.0–34.0)
MCHC: 33.9 g/dL (ref 30.0–36.0)
MCV: 83.9 fL (ref 78.0–100.0)
Platelets: 231 10*3/uL (ref 150–400)
RBC: 3.41 MIL/uL — ABNORMAL LOW (ref 3.87–5.11)
RDW: 12.9 % (ref 11.5–15.5)
WBC: 9.4 10*3/uL (ref 4.0–10.5)

## 2011-09-11 LAB — COMPREHENSIVE METABOLIC PANEL
ALT: 61 U/L — ABNORMAL HIGH (ref 0–35)
AST: 64 U/L — ABNORMAL HIGH (ref 0–37)
Albumin: 2 g/dL — ABNORMAL LOW (ref 3.5–5.2)
Alkaline Phosphatase: 77 U/L (ref 39–117)
BUN: 12 mg/dL (ref 6–23)
CO2: 28 mEq/L (ref 19–32)
Calcium: 8.2 mg/dL — ABNORMAL LOW (ref 8.4–10.5)
Chloride: 99 mEq/L (ref 96–112)
Creatinine, Ser: 0.55 mg/dL (ref 0.50–1.10)
GFR calc Af Amer: >90 mL/min (ref >90)
GFR calc non Af Amer: >90 mL/min (ref >90)
Glucose, Bld: 149 mg/dL — ABNORMAL HIGH (ref 70–99)
Potassium: 3.8 mEq/L (ref 3.5–5.1)
Sodium: 132 mEq/L — ABNORMAL LOW (ref 135–145)
Total Bilirubin: 0.3 mg/dL (ref 0.3–1.2)
Total Protein: 5.5 g/dL — ABNORMAL LOW (ref 6.0–8.3)

## 2011-09-11 LAB — GLUCOSE, CAPILLARY
Glucose-Capillary: 179 mg/dL — ABNORMAL HIGH (ref 70–99)
Glucose-Capillary: 152 mg/dL — ABNORMAL HIGH (ref 70–99)
Glucose-Capillary: 147 mg/dL — ABNORMAL HIGH (ref 70–99)
Glucose-Capillary: 175 mg/dL — ABNORMAL HIGH (ref 70–99)

## 2011-09-11 LAB — TRIGLYCERIDES: Triglycerides: 112 mg/dL (ref <150)

## 2011-09-11 LAB — PHOSPHORUS: Phosphorus: 4 mg/dL (ref 2.3–4.6)

## 2011-09-11 LAB — CHOLESTEROL, TOTAL: Cholesterol: 117 mg/dL (ref 0–200)

## 2011-09-11 LAB — MAGNESIUM: Magnesium: 1.8 mg/dL (ref 1.5–2.5)

## 2011-09-11 LAB — PREALBUMIN: Prealbumin: 9 mg/dL — ABNORMAL LOW (ref 17.0–34.0)

## 2011-09-11 MED ORDER — TRACE MINERALS CR-CU-MN-SE-ZN 10-1000-500-60 MCG/ML IV SOLN
INTRAVENOUS | Status: AC
  Administered 2011-09-11: 18:00:00 via INTRAVENOUS
  Filled 2011-09-11: qty 2000

## 2011-09-11 MED ORDER — FAT EMULSION 20 % IV EMUL
250.0000 mL | INTRAVENOUS | Status: AC
  Administered 2011-09-11: 250 mL via INTRAVENOUS
  Filled 2011-09-11: qty 250

## 2011-09-11 MED ORDER — INSULIN ASPART 100 UNIT/ML ~~LOC~~ SOLN
0.0000 [IU] | Freq: Four times a day (QID) | SUBCUTANEOUS | Status: DC
  Administered 2011-09-11: 3 [IU] via SUBCUTANEOUS
  Administered 2011-09-11: 2 [IU] via SUBCUTANEOUS
  Administered 2011-09-12 (×3): 3 [IU] via SUBCUTANEOUS
  Administered 2011-09-12: 1 [IU] via SUBCUTANEOUS
  Administered 2011-09-13 (×2): 3 [IU] via SUBCUTANEOUS
  Filled 2011-09-11: qty 3

## 2011-09-11 NOTE — Progress Notes (Addendum)
PARENTERAL NUTRITION/ANTIBIOTIC CONSULT NOTE - FOLLOW UP  Pharmacy Consult for TNA and Zosyn Indication: Prolonged ileus, SBO  No Known Allergies  Patient Measurements: Height: 4\' 11"  (149.9 cm) Weight: 155 lb (70.308 kg) IBW/kg (Calculated) : 43.2  Adjusted Body Weight: 53kg Usual Weight: 64.8kg  Vital Signs: Temp: 98.6 F (37 C) (02/25 0547) Temp src: Oral (02/25 0547) BP: 116/71 mmHg (02/25 0547) Pulse Rate: 92  (02/25 0547) Intake/Output from previous day: 02/24 0701 - 02/25 0700 In: 3107.1 [I.V.:1107.8; NG/GT:30; TPN:1969.3] Out: 3650 [Urine:3350; Emesis/NG output:300] Intake/Output from this shift: 1639/3650 = -2L (note this is incorrect because TNA provides 1920 ml intake) NG Output 300 ml  Labs:  Basename 09/11/11 0500 09/09/11 0605  WBC 9.4 6.3  HGB 9.7* 9.1*  HCT 28.6* 27.0*  PLT 231 175  APTT -- --  INR -- --     Basename 09/11/11 0500 09/10/11 0533  NA 132* 134*  K 3.8 3.3*  CL 99 98  CO2 28 28  GLUCOSE 149* 143*  BUN 12 11  CREATININE 0.55 0.56  LABCREA -- --  CREAT24HRUR -- --  CALCIUM 8.2* 8.1*  MG 1.8 --  PHOS 4.0 --  PROT 5.5* --  ALBUMIN 2.0* --  AST 64* --  ALT 61* --  ALKPHOS 77 --  BILITOT 0.3 --  BILIDIR -- --  IBILI -- --  PREALBUMIN -- --  TRIG 112 --  CHOLHDL -- --  CHOL 117 --   Estimated Creatinine Clearance: 64.5 ml/min (by C-G formula based on Cr of 0.55).    Basename 09/10/11 2355 09/10/11 1818 09/10/11 1221  GLUCAP 179* 143* 146*    Medications:  Scheduled:     . heparin  5,000 Units Subcutaneous Q8H  . insulin aspart  0-9 Units Subcutaneous Q6H  . morphine   Intravenous Q4H  . pantoprazole (PROTONIX) IV  40 mg Intravenous QHS  . piperacillin-tazobactam (ZOSYN)  IV  3.375 g Intravenous Q8H  . potassium chloride  10 mEq Intravenous Q1 Hr x 4    Insulin Requirements in the past 24 hours:  6 units SSI  Current Nutrition:  NPO Clinimix E 5/15 at 80 ml/hr (Goal) Lipid 20% @ 58ml/hr MWF only due to  national shortage NS at 69ml/hr  Assessment: 61 yo F admitted with nausea, vomiting, abdominal pain on 2/16, CT showed evidence of SBO  Underwent ex lap, lysis of adhesions, small bowel resection with primary anastomosis on 2/19  TNA initiated 2/19. Tolerating TNA @ goal rate 80 ml/hr, Lipids MWF @ 14ml/hr K+ corrected again today after 4 runs of KCL yesterday Prealbumin low 8.0 2/21 likely reflective of acute illness - await result from today Rise in AST/ALT to 64/61 from 27/21 CBG improved - 2 values greater than goal of < 150 Wt 155 lb today - note negative fluid balance last 24 hr  Nutritional Goals:  Per RD assessment:  Kcal:1550-1750   Protein:95-115g  TNA @ goal to provide 96g/day protein and 1843 Kcal/day MWF, 1363 Kcal/day STTHS (Avg 1569 Kcal/day).  Plan: TNA  Continue Clinimix E 5/15 to goal 56ml/hr today  Will monitor LFTs closely - if continue to rise may need to adjust TNA  Fat emulsion 10 ml/hr only on MWF due to ongoing shortage  MVI/TE MWF only due to ongoing shortage  Daily weights  Cont SSI coverage - will change to moderate scale  TNA lab panels on Mondays & Thursdays.  Follow resolution of ileus/sbo and ability to advance diet  Zosyn  Today  is Day 7 of Zosyn for empirical abdominal post-op coverage. Afebrile. Stable Scr and WBC. Continue current Zosyn dosing but what is plan for length of therapy? D/C after today to complete 7 days?  Hessie Knows, PharmD, BCPS pager [phone] 09/11/2011 8:40 AM

## 2011-09-11 NOTE — Plan of Care (Signed)
Problem: Inadequate Intake (NI-2.1) Goal: Food and/or nutrient delivery Individualized approach for food/nutrient provision.  Outcome: Not Progressing Pt remains NPO, awaiting BM and diet advancement, on TNA for nutrition

## 2011-09-11 NOTE — Progress Notes (Signed)
Nutrition Follow-up  Diet Order: NPO  TNA: Clinimix E 5/15 @ 80 ml/hr.  Lipids (20% IVFE @ 10 ml/hr), multivitamins, and trace elements are provided 3 times weekly (MWF) due to national backorder.  Provides 1569 kcal and 96 grams protein daily (based on weekly average).  Meets 100% minimum estimated kcal and 100% minimum estimated protein needs.  Additional IVF with NS @ 45 ml/hr.  - Pt still with minimal flatus, no BM since surgery. Wound VAC in place. Per PA notes, plan is to try clamping NGT today see if pt can tolerate and continue with TNA for now. Met with pt who denied any nausea, still c/o soreness.    Meds: Scheduled Meds:   . heparin  5,000 Units Subcutaneous Q8H  . insulin aspart  0-15 Units Subcutaneous Q6H  . morphine   Intravenous Q4H  . pantoprazole (PROTONIX) IV  40 mg Intravenous QHS  . piperacillin-tazobactam (ZOSYN)  IV  3.375 g Intravenous Q8H  . potassium chloride  10 mEq Intravenous Q1 Hr x 4  . DISCONTD: insulin aspart  0-9 Units Subcutaneous Q6H   Continuous Infusions:   . sodium chloride 45 mL/hr at 09/11/11 0814  . TPN (CLINIMIX) +/- additives     And  . fat emulsion    . TPN (CLINIMIX) +/- additives 80 mL/hr at 09/09/11 1726  . TPN (CLINIMIX) +/- additives 80 mL/hr at 09/10/11 1741   PRN Meds:.acetaminophen, acetaminophen, diphenhydrAMINE, diphenhydrAMINE, naloxone, ondansetron (ZOFRAN) IV, ondansetron (ZOFRAN) IV, phenol, sodium chloride, sodium chloride  Labs:  CMP     Component Value Date/Time   NA 132* 09/11/2011 0500   K 3.8 09/11/2011 0500   CL 99 09/11/2011 0500   CO2 28 09/11/2011 0500   GLUCOSE 149* 09/11/2011 0500   BUN 12 09/11/2011 0500   CREATININE 0.55 09/11/2011 0500   CALCIUM 8.2* 09/11/2011 0500   PROT 5.5* 09/11/2011 0500   ALBUMIN 2.0* 09/11/2011 0500   AST 64* 09/11/2011 0500   ALT 61* 09/11/2011 0500   ALKPHOS 77 09/11/2011 0500   BILITOT 0.3 09/11/2011 0500   GFRNONAA >90 09/11/2011 0500   GFRAA >90 09/11/2011 0500   CBG (last 3)    Basename 09/10/11 2355 09/10/11 1818 09/10/11 1221  GLUCAP 179* 143* 146*   - Noted consistently slightly low sodium - Albumin relatively stable, 2.1 on 2/20, 2 today, not likely to improve r/t inflammatory process of wounds from surgery - Noted AST/ALT increased from 27/21 on 2/20 to 64/61 today, pharmacist monitoring  - 2/20 PALB 8, checking weekly, awaiting today's results - Noted SSI changed to moderate scale r/t pt with some values >150 goal     Intake/Output Summary (Last 24 hours) at 09/11/11 1014 Last data filed at 09/11/11 0700  Gross per 24 hour  Intake 3107.08 ml  Output   3000 ml  Net 107.08 ml   Last BM - 09/04/11 NGT output - total for previous day  Weight Status:   2/16 64.8kg 2/25  70.3kg - no edema documented   Nutrition Dx: Inadequate oral intake - ongoing  Goal: TNA to meet >90% of estimated nutritional needs - met New goal: Pt to have BM.   Intervention: TNA per pharmacy. Awaiting BM and diet advancement per MD. No educational needs at this time.  Monitor: Weights, labs, TNA, BM, diet advancement    Marshall Cork Pager #: [phone]

## 2011-09-11 NOTE — Progress Notes (Signed)
Tolerating ng clamp so far. Min flatus. abd soft, nd, min TTP. Wound vac in place.  Cont ng clamp Cont TPN Stop IV abx after pod 7  Eric M. Andrey Campanile, MD, FACS General, Bariatric, & Minimally Invasive Surgery [facility], [place]

## 2011-09-11 NOTE — Progress Notes (Signed)
Patient ID: PARI LOMBARD, female   DOB: [birth year], 61 y.o.   MRN: [id number] 6 Days Post-Op  Subjective: Pt feels ok.  Still minimal flatus, no BM  Objective: Vital signs in last 24 hours: Temp:  [98 F (36.7 C)-98.6 F (37 C)] 98.6 F (37 C) (02/25 0547) Pulse Rate:  [86-93] 92  (02/25 0547) Resp:  [18-20] 18  (02/25 0547) BP: (116-135)/(64-73) 116/71 mmHg (02/25 0547) SpO2:  [97 %-100 %] 100 % (02/25 0547) Weight:  [155 lb (70.308 kg)] 155 lb (70.308 kg) (02/25 0700) Last BM Date: 09/04/11  Intake/Output from previous day: 02/24 0701 - 02/25 0700 In: 3107.1 [I.V.:1107.8; NG/GT:30; BJY:7829.5] Out: 3650 [Urine:3350; Emesis/NG output:300] Intake/Output this shift:    PE: Abd: soft, mildly tender, few BS, ND, wound VAC in place.  NGT with some minimal bilious output  Lab Results:   Basename 09/11/11 0500 09/09/11 0605  WBC 9.4 6.3  HGB 9.7* 9.1*  HCT 28.6* 27.0*  PLT 231 175   BMET  Basename 09/11/11 0500 09/10/11 0533  NA 132* 134*  K 3.8 3.3*  CL 99 98  CO2 28 28  GLUCOSE 149* 143*  BUN 12 11  CREATININE 0.55 0.56  CALCIUM 8.2* 8.1*   PT/INR No results found for this basename: LABPROT:2,INR:2 in the last 72 hours   Studies/Results: No results found.  Anti-infectives: Anti-infectives     Start     Dose/Rate Route Frequency Ordered Stop   09/05/11 2000  piperacillin-tazobactam (ZOSYN) IVPB 3.375 g       3.375 g 12.5 mL/hr over 240 Minutes Intravenous 3 times per day 09/05/11 1910     09/05/11 1145   Ampicillin-Sulbactam (UNASYN) 3 g in sodium chloride 0.9 % 100 mL IVPB  Status:  Discontinued     Comments: preop      3 g 100 mL/hr over 60 Minutes Intravenous  Once 09/05/11 1142 09/05/11 1854           Assessment/Plan  1. S/p ex lap with SBR for ischemic bowel from SBO 2. Post-op ileus 3. PCM/TNA  Plan: 1. Try clamping NGT today and see if she can tolerate 2. Cont NPO/TNA 3. Cont mobilization.   LOS: 9 days    OSBORNE,KELLY  E 09/11/2011

## 2011-09-12 LAB — COMPREHENSIVE METABOLIC PANEL
ALT: 72 U/L — ABNORMAL HIGH (ref 0–35)
AST: 59 U/L — ABNORMAL HIGH (ref 0–37)
Albumin: 2.1 g/dL — ABNORMAL LOW (ref 3.5–5.2)
Alkaline Phosphatase: 84 U/L (ref 39–117)
BUN: 12 mg/dL (ref 6–23)
CO2: 26 mEq/L (ref 19–32)
Calcium: 8.3 mg/dL — ABNORMAL LOW (ref 8.4–10.5)
Chloride: 98 mEq/L (ref 96–112)
Creatinine, Ser: 0.57 mg/dL (ref 0.50–1.10)
GFR calc Af Amer: >90 mL/min (ref >90)
GFR calc non Af Amer: >90 mL/min (ref >90)
Glucose, Bld: 155 mg/dL — ABNORMAL HIGH (ref 70–99)
Potassium: 3.9 mEq/L (ref 3.5–5.1)
Sodium: 132 mEq/L — ABNORMAL LOW (ref 135–145)
Total Bilirubin: 0.3 mg/dL (ref 0.3–1.2)
Total Protein: 5.8 g/dL — ABNORMAL LOW (ref 6.0–8.3)

## 2011-09-12 LAB — GLUCOSE, CAPILLARY
Glucose-Capillary: 129 mg/dL — ABNORMAL HIGH (ref 70–99)
Glucose-Capillary: 151 mg/dL — ABNORMAL HIGH (ref 70–99)
Glucose-Capillary: 158 mg/dL — ABNORMAL HIGH (ref 70–99)
Glucose-Capillary: 162 mg/dL — ABNORMAL HIGH (ref 70–99)
Glucose-Capillary: 164 mg/dL — ABNORMAL HIGH (ref 70–99)
Glucose-Capillary: 167 mg/dL — ABNORMAL HIGH (ref 70–99)

## 2011-09-12 MED ORDER — CLINIMIX E/DEXTROSE (5/15) 5 % IV SOLN
INTRAVENOUS | Status: DC
  Administered 2011-09-12: 18:00:00 via INTRAVENOUS
  Filled 2011-09-12: qty 2000

## 2011-09-12 NOTE — Progress Notes (Signed)
+  flatus. +belching. Tolerating clears. Had a little nausea with clears eariler.   abd soft.   Stay on liquids for now.- do not adv Cont tpn  Mary Sella. Andrey Campanile, MD, FACS General, Bariatric, & Minimally Invasive Surgery [facility], [place]

## 2011-09-12 NOTE — Progress Notes (Signed)
PARENTERAL NUTRITION/ANTIBIOTIC CONSULT NOTE - FOLLOW UP  Pharmacy Consult for TNA and Zosyn Indication: Prolonged ileus, SBO  No Known Allergies  Patient Measurements: Height: 4\' 11"  (149.9 cm) Weight: 155 lb (70.308 kg) IBW/kg (Calculated) : 43.2  Adjusted Body Weight: 53kg Usual Weight: 64.8kg  Vital Signs: Temp: 98.6 F (37 C) (02/26 0545) Temp src: Oral (02/26 0545) BP: 136/76 mmHg (02/26 0545) Pulse Rate: 89  (02/26 0545) Intake/Output from previous day: 02/25 0701 - 02/26 0700 In: 2640 [I.V.:1110; IV Piggyback:110; TPN:1420] Out: 4450 [Urine:4450] Intake/Output from this shift: 1639/3650 = -2L (note this is incorrect because TNA provides 1920 ml intake) NG Output 300 ml  Labs:  Basename 09/11/11 0500  WBC 9.4  HGB 9.7*  HCT 28.6*  PLT 231  APTT --  INR --     Basename 09/12/11 0550 09/11/11 0500 09/10/11 0533  NA 132* 132* 134*  K 3.9 3.8 3.3*  CL 98 99 98  CO2 26 28 28   GLUCOSE 155* 149* 143*  BUN 12 12 11   CREATININE 0.57 0.55 0.56  LABCREA -- -- --  CREAT24HRUR -- -- --  CALCIUM 8.3* 8.2* 8.1*  MG -- 1.8 --  PHOS -- 4.0 --  PROT 5.8* 5.5* --  ALBUMIN 2.1* 2.0* --  AST 59* 64* --  ALT 72* 61* --  ALKPHOS 84 77 --  BILITOT 0.3 0.3 --  BILIDIR -- -- --  IBILI -- -- --  PREALBUMIN -- 9.0* --  TRIG -- 112 --  CHOLHDL -- -- --  CHOL -- 117 --   Estimated Creatinine Clearance: 64.5 ml/min (by C-G formula based on Cr of 0.57).    Basename 09/12/11 0617 09/12/11 0016 09/11/11 1759  GLUCAP 167* 164* 147*    Medications:  Scheduled:     . heparin  5,000 Units Subcutaneous Q8H  . insulin aspart  0-15 Units Subcutaneous Q6H  . morphine   Intravenous Q4H  . pantoprazole (PROTONIX) IV  40 mg Intravenous QHS  . piperacillin-tazobactam (ZOSYN)  IV  3.375 g Intravenous Q8H    Insulin Requirements in the past 24 hours:  15 units SSI  Current Nutrition:  NPO, NG tube in place, no output charted 2/25 Clinimix E 5/15 at 80 ml/hr  (Goal) Lipid 20% @ 64ml/hr MWF only due to national shortage NS at 40ml/hr  Assessment: 61 yo F admitted with nausea, vomiting, abdominal pain on 2/16, CT showed evidence of SBO  Underwent ex lap, lysis of adhesions, small bowel resection with primary anastomosis on 2/19  TNA initiated 2/19. Tolerating TNA @ goal rate 80 ml/hr, Lipids MWF @ 42ml/hr K+ WNL Na remains low 132 meq/L this am Prealbumin low 8.0 2/21 up to 9.0 on 2/25.Likely reflective of acute illness - await result from today Rise in AST/ALT to 64/61 from 27/21 CBG's remain 150+, SSI to moderate scale Wt 155 lb 2/25 - note negative fluid balance last 24 hr of 1.8 L  Nutritional Goals:  Per RD assessment:  Kcal:1550-1750   Protein:95-115g  TNA @ goal to provide 96g/day protein and 1843 Kcal/day MWF, 1363 Kcal/day STTHS (Avg 1569 Kcal/day).  Plan: TNA  Continue Clinimix E 5/15 to goal 62ml/hr today  Will monitor LFTs closely - if continue to rise may need to adjust TNA  Fat emulsion 10 ml/hr only on MWF due to ongoing shortage  MVI/TE MWF only due to ongoing shortage  Daily weights requested  Cont SSI coverage - changed to moderate scale  TNA lab panels on Mondays &  Thursdays.  Follow resolution of ileus/sbo and ability to advance diet   Otho Bellows, PharmD,  pager [phone] 09/12/2011 9:03 AM

## 2011-09-12 NOTE — Progress Notes (Signed)
Patient ID: Savannah Bell, female   DOB: 1951/01/10, 61 y.o.   MRN: [id number] 7 Days Post-Op  Subjective: Pt with lots of flatus today.  No nausea with NGT clamped.  Objective: Vital signs in last 24 hours: Temp:  [97.3 F (36.3 C)-98.8 F (37.1 C)] 98.6 F (37 C) (02/26 0545) Pulse Rate:  [89-97] 89  (02/26 0545) Resp:  [16-20] 16  (02/26 0800) BP: (124-136)/(66-76) 136/76 mmHg (02/26 0545) SpO2:  [98 %-99 %] 99 % (02/26 0800) Last BM Date: 09/04/11  Intake/Output from previous day: 02/25 0701 - 02/26 0700 In: 2640 [I.V.:1110; IV Piggyback:110; TPN:1420] Out: 4450 [Urine:4450] Intake/Output this shift:    PE: Abd: soft, +BS, ND, incision with wound VAC in place.  Lab Results:   Basename 09/11/11 0500  WBC 9.4  HGB 9.7*  HCT 28.6*  PLT 231   BMET  Basename 09/12/11 0550 09/11/11 0500  NA 132* 132*  K 3.9 3.8  CL 98 99  CO2 26 28  GLUCOSE 155* 149*  BUN 12 12  CREATININE 0.57 0.55  CALCIUM 8.3* 8.2*   PT/INR No results found for this basename: LABPROT:2,INR:2 in the last 72 hours   Studies/Results: No results found.  Anti-infectives: Anti-infectives     Start     Dose/Rate Route Frequency Ordered Stop   09/05/11 2000  piperacillin-tazobactam (ZOSYN) IVPB 3.375 g       3.375 g 12.5 mL/hr over 240 Minutes Intravenous 3 times per day 09/05/11 1910     09/05/11 1145   Ampicillin-Sulbactam (UNASYN) 3 g in sodium chloride 0.9 % 100 mL IVPB  Status:  Discontinued     Comments: preop      3 g 100 mL/hr over 60 Minutes Intravenous  Once 09/05/11 1142 09/05/11 1854           Assessment/Plan  1. S/p ex lap with SBR 2. Post-op ileus 3. PCM/TNA  Plan: 1. Advance to clear liquids and dc NGT. 2. Will start to wean TNA tomorrow if tolerates clears 3. Cont wound vac changes, will not need VAC at home.   LOS: 10 days    OSBORNE,KELLY E 09/12/2011

## 2011-09-13 DIAGNOSIS — E46 Unspecified protein-calorie malnutrition: Secondary | ICD-10-CM

## 2011-09-13 LAB — GLUCOSE, CAPILLARY
Glucose-Capillary: 167 mg/dL — ABNORMAL HIGH (ref 70–99)
Glucose-Capillary: 114 mg/dL — ABNORMAL HIGH (ref 70–99)
Glucose-Capillary: 107 mg/dL — ABNORMAL HIGH (ref 70–99)

## 2011-09-13 MED ORDER — CLINIMIX E/DEXTROSE (5/15) 5 % IV SOLN: INTRAVENOUS | Status: AC

## 2011-09-13 MED ORDER — CLINIMIX E/DEXTROSE (5/15) 5 % IV SOLN
INTRAVENOUS | Status: DC
  Filled 2011-09-13: qty 2000

## 2011-09-13 NOTE — Progress Notes (Signed)
Tolerating liquids. Had a bm.   Looks good.  Plan as below.   Mary Sella. Andrey Campanile, MD, FACS General, Bariatric, & Minimally Invasive Surgery [facility], [place]

## 2011-09-13 NOTE — Progress Notes (Signed)
CARE MANAGEMENT NOTE 09/13/2011  Patient:  Savannah Bell, Savannah Bell   Account Number:  [id number]  Date Initiated:  09/08/2011  Documentation initiated by:  Edd Arbour  Subjective/Objective Assessment:   51 F admittedon 09/02/11 fro n/v abdominal pain DX SBO per Dr Sheron Nightingale with resolving ileus PCP [name] Lived alone with family support upon admissoin Expecting d/c home     Action/Plan:   D/C when medically stable   Anticipated DC Date:  09/16/2011   Anticipated DC Plan:  HOME W HOME HEALTH SERVICES      DC Planning Services  CM consult      [facility] Choice  HOME HEALTH   Choice offered to / List presented to:  C-1 Patient        HH arranged  HH-1 RN      [facility] agency  [facility] Care   Status of service:  In process, will continue to follow  Per UR Regulation:  Reviewed for med. necessity/level of care/duration of stay  Comments:  09/13/11, Kathi Der RNC-MNN, BSN, [phone], CM received referral for [facility] services.  CM met with pt and offered choice for [facility] services.  Pt. chose [facility].  CM spoke with Okey Regal at [facility] given and confirmation of services received.  Demographics, order, and H&P faxed to [facility] at [facility] at [phone]. 09/08/11 1617 Manya Silvas, CCM [phone] F admitted on 09/02/11 fro n/v abdominal pain DX SBO with post op ileus per Dr Sheron Nightingale, s/p ex lap with LOA and SBR with ischemic bowel PCP [name] Lived alone with family support Expecting d/c home 09/08/11 Attending MD note states awaiting ileus resolve. No d/c planning orders noted in EPIC. CM went to visit pt but she is asleep NGT remains intact.  Will continue to be followed

## 2011-09-13 NOTE — Progress Notes (Signed)
HOME HEALTH AGENCIES SERVING [place]   Agencies that are Medicare-Certified and are affiliated with The [facility] Agency  Telephone Number Address  [facility]   The [facility] has ownership interest in this company; however, you are under no obligation to use this agency. [phone] or  [phone] [address] [place], [place] [address]   Agencies that are Medicare-Certified and are not affiliated with [facility]                                                                                 Home Health Agency Telephone Number Address  [facility] [phone] Fax [phone] [address], [address] [place], [place]  [address]  [facility] [phone] or [phone] Fax [phone] [address] [place], [place] [address]  [facility] [phone] Fax [phone] [address] [place], [place] [address]  [facility] [phone] Fax [phone] [address] [place], [place]  [address]  [facility] [phone] Fax [phone] [address], [address] [place], [place] [address]  [facility] [phone] [address] [place], [place] [address]  [facility] [phone]  [address] [place], [place] [address]  [facility] [phone] or [phone] Fax [phone] [address] [place], [place]  [address]  [facility] [phone] Fax [phone] [address] [place], [place]  [address]  [facility]  [phone] Fax [phone] [address] [place], [place] [address]               Agencies that are not Medicare-Certified and are not affiliated with [facility]   Home Health Agency Telephone Number Address  [facility] [phone] or [phone] Fax [phone] [address] [place], [place]  [address]  [facility] [phone] Fax [phone] [address] [place], [place]  [address]  [facility]  [phone] Fax [phone] [address] [place], [place] [address]  [facility] [phone] Fax [phone] [address] [place], [place] [address]  [facility] [phone] or [phone] Fax [phone] [address] [place], [place]  [address]  [facility] [phone] or [phone] Fax [phone] [address] [place], [place]  [address]  [facility] [phone] Fax [phone] [address] [place], [place]  [address]  [facility] [phone] [address] [place], [place]  [address]  [facility] [phone] Fax [phone] [address] [place], [place]  [address]  [facility] [phone] Fax [phone] [address] [place], [place]  [address]  [facility] [phone] Fax [phone] [address] [place], [place] [address]  [facility] [phone] Fax [phone] [address] [place], [place]  [address]   

## 2011-09-13 NOTE — Progress Notes (Signed)
Patient ID: Savannah Bell, female   DOB: 02/20/1951, 61 y.o.   MRN: [id number] 8 Days Post-Op  Subjective: Pt feeling good.  Tolerating clear liquids.  No issue with these.  No BM yet.  Objective: Vital signs in last 24 hours: Temp:  [98.2 F (36.8 C)-98.4 F (36.9 C)] 98.3 F (36.8 C) (02/27 0620) Pulse Rate:  [92-108] 92  (02/27 0620) Resp:  [16-18] 18  (02/27 0755) BP: (108-133)/(73-79) 108/73 mmHg (02/27 0620) SpO2:  [99 %-100 %] 99 % (02/27 0755) Last BM Date: 09/04/11  Intake/Output from previous day: 02/26 0701 - 02/27 0700 In: 1575 [P.O.:120; I.V.:495; TPN:960] Out: 3350 [Urine:3350] Intake/Output this shift: Total I/O In: -  Out: 300 [Urine:300]  PE: Abd: soft, minimally tender, +BS, wound VAC in place  Lab Results:   Basename 09/11/11 0500  WBC 9.4  HGB 9.7*  HCT 28.6*  PLT 231   BMET  Basename 09/12/11 0550 09/11/11 0500  NA 132* 132*  K 3.9 3.8  CL 98 99  CO2 26 28  GLUCOSE 155* 149*  BUN 12 12  CREATININE 0.57 0.55  CALCIUM 8.3* 8.2*   PT/INR No results found for this basename: LABPROT:2,INR:2 in the last 72 hours   Studies/Results: No results found.  Anti-infectives: Anti-infectives     Start     Dose/Rate Route Frequency Ordered Stop   09/05/11 2000  piperacillin-tazobactam (ZOSYN) IVPB 3.375 g       3.375 g 12.5 mL/hr over 240 Minutes Intravenous 3 times per day 09/05/11 1910 09/13/11 0028   09/05/11 1145   Ampicillin-Sulbactam (UNASYN) 3 g in sodium chloride 0.9 % 100 mL IVPB  Status:  Discontinued     Comments: preop      3 g 100 mL/hr over 60 Minutes Intravenous  Once 09/05/11 1142 09/05/11 1854           Assessment/Plan  1. Ex lap with SBR 2. PCM/TNA  Plan: 1. Wean TNA to off today 2. Advance diet 3. D/c VAC tomorrow and start WD dressing changes.  Set up [facility]   LOS: 11 days    , E 09/13/2011

## 2011-09-13 NOTE — Progress Notes (Signed)
PARENTERAL NUTRITION - FOLLOW UP  Pharmacy Consult for TNA  Indication: Prolonged ileus, SBO  No Known Allergies  Patient Measurements: Height: 4\' 11"  (149.9 cm) Weight: 155 lb (70.308 kg) IBW/kg (Calculated) : 43.2  Adjusted Body Weight: 53kg Usual Weight: 64.8kg  Vital Signs: Temp: 98.3 F (36.8 C) (02/27 0620) Temp src: Oral (02/27 0620) BP: 108/73 mmHg (02/27 0620) Pulse Rate: 92  (02/27 0620) Intake/Output from previous day: 02/26 0701 - 02/27 0700 In: 1575 [P.O.:120; I.V.:495; TPN:960] Out: 3350 [Urine:3350] Intake/Output from this shift: 1639/3650 = -2L (note this is incorrect because TNA provides 1920 ml intake) NG Output 300 ml  Labs:  Basename 09/11/11 0500  WBC 9.4  HGB 9.7*  HCT 28.6*  PLT 231  APTT --  INR --     Basename 09/12/11 0550 09/11/11 0500  NA 132* 132*  K 3.9 3.8  CL 98 99  CO2 26 28  GLUCOSE 155* 149*  BUN 12 12  CREATININE 0.57 0.55  LABCREA -- --  CREAT24HRUR -- --  CALCIUM 8.3* 8.2*  MG -- 1.8  PHOS -- 4.0  PROT 5.8* 5.5*  ALBUMIN 2.1* 2.0*  AST 59* 64*  ALT 72* 61*  ALKPHOS 84 77  BILITOT 0.3 0.3  BILIDIR -- --  IBILI -- --  PREALBUMIN -- 9.0*  TRIG -- 112  CHOLHDL -- --  CHOL -- 117   Estimated Creatinine Clearance: 64.5 ml/min (by C-G formula based on Cr of 0.57).    Basename 09/13/11 0619 09/12/11 2352 09/12/11 2053  GLUCAP 167* 158* 151*    Medications:  Scheduled:     . heparin  5,000 Units Subcutaneous Q8H  . insulin aspart  0-15 Units Subcutaneous Q6H  . morphine   Intravenous Q4H  . pantoprazole (PROTONIX) IV  40 mg Intravenous QHS  . piperacillin-tazobactam (ZOSYN)  IV  3.375 g Intravenous Q8H    Insulin Requirements in the past 24 hours:  15 units SSI  Current Nutrition:  Full liquid  Clinimix E 5/15 at 80 ml/hr (Goal) Lipid 20% @ 70ml/hr MWF only due to national shortage NS at 47ml/hr  Assessment: 86 yof s/p ex lap, lysis of adhesions, small bowel resection with primary anastomosis on  2/19  TNA initiated 2/19. Tolerating TNA @ goal rate 80 ml/hr, Lipids MWF @ 50ml/hr Surgery ordered to wean TNA to off today  Nutritional Goals:  Per RD assessment:  Kcal:1550-1750   Protein:95-115g  TNA @ goal to provide 96g/day protein and 1843 Kcal/day MWF, 1363 Kcal/day STTHS (Avg 1569 Kcal/day).  Plan:  Decrease Clinimix E 5/15 to 40 ml/hr now then off at 1800 tonight  MD please reassess IVF requirement now that pt is off TNA.    Will d/c all TNA labs  Pharmacy will sign off.   Geoffry Paradise, PharmD.   Pager:  [phone] 9:06 AM

## 2011-09-13 NOTE — Progress Notes (Signed)
Patient discussed at the Long Length of Stay ,  Weeks 09/13/2011  

## 2011-09-14 LAB — GLUCOSE, CAPILLARY
Glucose-Capillary: 110 mg/dL — ABNORMAL HIGH (ref 70–99)
Glucose-Capillary: 112 mg/dL — ABNORMAL HIGH (ref 70–99)
Glucose-Capillary: 112 mg/dL — ABNORMAL HIGH (ref 70–99)
Glucose-Capillary: 117 mg/dL — ABNORMAL HIGH (ref 70–99)

## 2011-09-14 MED ORDER — OXYCODONE-ACETAMINOPHEN 5-325 MG PO TABS
1.0000 | ORAL_TABLET | ORAL | Status: DC | PRN
  Administered 2011-09-14: 2 via ORAL
  Filled 2011-09-14: qty 2

## 2011-09-14 NOTE — Progress Notes (Signed)
Nutrition Brief Note  - Noted TNA d/c and plans for pt to d/c tomorrow. Wound VAC d/c this morning. Pt reports eating well and appetite returning. Briefly discussed importance of protein in wound healing and sources of protein in the diet and provided pt with samples of Ensure. Pt expressed understanding.   Dietitian # [phone]

## 2011-09-14 NOTE — Progress Notes (Signed)
Tolerating diet. No n/v. abd soft, nt  Can d/c staples in am. Home Friday.  Mary Sella. Andrey Campanile, MD, FACS General, Bariatric, & Minimally Invasive Surgery [facility], [place]

## 2011-09-14 NOTE — Plan of Care (Signed)
Problem: Inadequate Intake (NI-2.1) Goal: Food and/or nutrient delivery Individualized approach for food/nutrient provision.  Outcome: Completed/Met Date Met:  09/14/11 Pt eating well

## 2011-09-14 NOTE — Progress Notes (Signed)
Patient ID: Savannah Bell, female   DOB: 1951/01/26, 61 y.o.   MRN: [id number] 9 Days Post-Op  Subjective: Pt feels very well today.  Had more BMs.  Tolerating full liquids.  Objective: Vital signs in last 24 hours: Temp:  [97.7 F (36.5 C)-99.2 F (37.3 C)] 98.1 F (36.7 C) (02/28 0618) Pulse Rate:  [87-103] 87  (02/28 0618) Resp:  [16-18] 18  (02/28 0800) BP: (102-105)/(62-68) 105/67 mmHg (02/28 0618) SpO2:  [97 %-100 %] 98 % (02/28 0618) Last BM Date: 09/04/11  Intake/Output from previous day: 02/27 0701 - 02/28 0700 In: 1055 [P.O.:560; I.V.:495] Out: 2003 [Urine:2000; Stool:3] Intake/Output this shift: Total I/O In: -  Out: 200 [Urine:200]  PE: Abd: soft, minimally tender, VAC removed today.  Wound is 100% clean with several staples in place at umbilicus.  +BS, ND  Lab Results:  No results found for this basename: WBC:2,HGB:2,HCT:2,PLT:2 in the last 72 hours BMET  [facility] 09/12/11 0550  NA 132*  K 3.9  CL 98  CO2 26  GLUCOSE 155*  BUN 12  CREATININE 0.57  CALCIUM 8.3*   PT/INR No results found for this basename: LABPROT:2,INR:2 in the last 72 hours   Studies/Results: No results found.  Anti-infectives: Anti-infectives     Start     Dose/Rate Route Frequency Ordered Stop   09/05/11 2000  piperacillin-tazobactam (ZOSYN) IVPB 3.375 g       3.375 g 12.5 mL/hr over 240 Minutes Intravenous 3 times per day 09/05/11 1910 09/13/11 0028   09/05/11 1145   Ampicillin-Sulbactam (UNASYN) 3 g in sodium chloride 0.9 % 100 mL IVPB  Status:  Discontinued     Comments: preop      3 g 100 mL/hr over 60 Minutes Intravenous  Once 09/05/11 1142 09/05/11 1854           Assessment/Plan  1. S/p ex lap with SBR 2. Post op ileus  Plan: 1. Advance to regular diet 2. D/c PCA, start po pain meds 3. Consider d/c staples tomorrow prior to dc 4. Start NS WD dressing changes today to abdominal wound.   LOS: 12 days    OSBORNE,KELLY E 09/14/2011

## 2011-09-15 LAB — GLUCOSE, CAPILLARY
Glucose-Capillary: 109 mg/dL — ABNORMAL HIGH (ref 70–99)
Glucose-Capillary: 96 mg/dL (ref 70–99)

## 2011-09-15 MED ORDER — OXYCODONE-ACETAMINOPHEN 5-325 MG PO TABS: 1.0000 | ORAL_TABLET | ORAL | Status: AC | PRN

## 2011-09-15 NOTE — Progress Notes (Signed)
Leave skin staples for now. Ok for d/c. Looks good.  Mary Sella. Andrey Campanile, MD, FACS General, Bariatric, & Minimally Invasive Surgery [facility], [place]

## 2011-09-15 NOTE — Discharge Summary (Signed)
Patient ID: BRYLEE BERK MRN: [id number] DOB/AGE: 1951/01/22 61 y.o.  Admit date: 09/02/2011 Discharge date: 09/15/2011  Procedures:  Exploratory Laparotomy with SBR  Consults: None  Reason for Admission:  This is a 61 yo female who presented to the [facility] with abdominal pain N/V.  She had a CT scan thate revealed a SBO.  She was admitted for further management.  See admitting H&P for further details.  Admission Diagnoses: 1. SBO  Hospital Course: The patient was admitted and an NGT was placed.  She was started on conservative management.  She was improving and had a BM and passed flatus.  Her x-rays revealed a resolution of her SBO after 2 days and her NGT was removed.  She then began to regress and began having N/V again.  Repeat films showed a worsening SBO.  She was then felt to require an operation.  She then underwent an ex lap which revealed SBO with ischemia.  She had a SBR completed.  She tolerated the procedure well.  She was placed on TNA after surgery.  She had a postoperative ileus as expected.  This took approximately 6 days to resolve.  She began passing flatus and her NGT was ultimately able to be removed.  Her diet was then advanced as tolerated.  By POD# 10, the patient was felt stable for discharge home.  She did has a skin wound VAC placed postoperatively.  This was discontinued on POD#9 and she was started on NS WD dressing changes.  She has HH set up for wound care.  She had no other medical problems during her stay.  Her abx therapy was dc on POD #7.  Discharge Diagnoses:  Active Problems:  SBO (small bowel obstruction) s/p ex lap with SBR  Discharge Medications: Medication List  As of 09/15/2011 10:59 AM   TAKE these medications         aspirin 325 MG tablet   Take 325 mg by mouth daily.      calcium gluconate 500 MG tablet   Take 500 mg by mouth daily.      cholecalciferol 1000 UNITS tablet   Commonly known as: VITAMIN D   Take 1,000 Units by mouth daily.        oxyCODONE-acetaminophen 5-325 MG per tablet   Commonly known as: PERCOCET   Take 1-2 tablets by mouth every 4 (four) hours as needed.            Discharge Instructions: Follow-up Information    Follow up with [facility], NP. (As needed)       Follow up with [facility], MD. Schedule an appointment as soon as possible for a visit in 3 weeks.   Contact information:   [employer] [address] [place] [address] [phone]          Signed: Letha Cape 09/15/2011, 10:59 AM

## 2011-09-15 NOTE — Progress Notes (Signed)
Patient ID: Savannah Bell, female   DOB: May 24, 1951, 61 y.o.   MRN: [id number] 10 Days Post-Op  Subjective: Feels well.  Tolerating a regular diet.  No other complaints  Objective: Vital signs in last 24 hours: Temp:  [97.9 F (36.6 C)-98.2 F (36.8 C)] 98.1 F (36.7 C) (03/01 0615) Pulse Rate:  [69-94] 69  (03/01 0615) Resp:  [16-18] 18  (03/01 0615) BP: (100-117)/(62-68) 100/68 mmHg (03/01 0615) SpO2:  [100 %] 100 % (03/01 0615) Last BM Date: 09/14/11  Intake/Output from previous day: 02/28 0701 - 03/01 0700 In: 2132.7 [P.O.:1960; I.V.:172.7] Out: 2900 [Urine:2900] Intake/Output this shift:    PE: Abd: soft, minimally tender, staples removed.  Other portion of wound packed.  +BS  Lab Results:  No results found for this basename: WBC:2,HGB:2,HCT:2,PLT:2 in the last 72 hours BMET No results found for this basename: NA:2,K:2,CL:2,CO2:2,GLUCOSE:2,BUN:2,CREATININE:2,CALCIUM:2 in the last 72 hours PT/INR No results found for this basename: LABPROT:2,INR:2 in the last 72 hours   Studies/Results: No results found.  Anti-infectives: Anti-infectives     Start     Dose/Rate Route Frequency Ordered Stop   09/05/11 2000  piperacillin-tazobactam (ZOSYN) IVPB 3.375 g       3.375 g 12.5 mL/hr over 240 Minutes Intravenous 3 times per day 09/05/11 1910 09/13/11 0028   09/05/11 1145   Ampicillin-Sulbactam (UNASYN) 3 g in sodium chloride 0.9 % 100 mL IVPB  Status:  Discontinued     Comments: preop      3 g 100 mL/hr over 60 Minutes Intravenous  Once 09/05/11 1142 09/05/11 1854           Assessment/Plan  1. S/p ex lap with SBR for ischemic SBO  Plan: 1. Ok for d/c home today.  Follow up in 2-3 weeks in our office.   LOS: 13 days    OSBORNE,KELLY E 09/15/2011

## 2011-09-15 NOTE — Discharge Summary (Signed)
M. , MD, FACS General, Bariatric, & Minimally Invasive Surgery [facility]  

## 2011-09-15 NOTE — Discharge Instructions (Signed)
Normal Saline wet to dry dressing changes to abdominal wound daily  [facility]      [facility] [phone]  OPEN ABDOMINAL SURGERY: POST OP INSTRUCTIONS  Always review your discharge instruction sheet given to you by the facility where your surgery was performed.  IF YOU HAVE DISABILITY OR FAMILY LEAVE FORMS, YOU MUST BRING THEM TO THE OFFICE FOR PROCESSING.  PLEASE DO NOT GIVE THEM TO YOUR DOCTOR.  1. A prescription for pain medication may be given to you upon discharge.  Take your pain medication as prescribed, if needed.  If narcotic pain medicine is not needed, then you may take acetaminophen (Tylenol) or ibuprofen (Advil) as needed. 2. Take your usually prescribed medications unless otherwise directed. 3. If you need a refill on your pain medication, please contact your pharmacy. They will contact our office to request authorization.  Prescriptions will not be filled after 5pm or on week-ends. 4. You should follow a light diet the first few days after arrival home, such as soup and crackers, pudding, etc.unless your doctor has advised otherwise. A high-fiber, low fat diet can be resumed as tolerated.   Be sure to include lots of fluids daily. Most patients will experience some swelling and bruising on the chest and neck area.  Ice packs will help.  Swelling and bruising can take several days to resolve 5. Most patients will experience some swelling and bruising in the area of the incision. Ice pack will help. Swelling and bruising can take several days to resolve..  6. It is common to experience some constipation if taking pain medication after surgery.  Increasing fluid intake and taking a stool softener will usually help or prevent this problem from occurring.  A mild laxative (Milk of Magnesia or Miralax) should be taken according to package directions if there are no bowel movements after 48 hours. 7.  You may have steri-strips (small skin tapes) in place directly over the  incision.  These strips should be left on the skin for 7-10 days.  If your surgeon used skin glue on the incision, you may shower in 24 hours.  The glue will flake off over the next 2-3 weeks.  Any sutures or staples will be removed at the office during your follow-up visit. You may find that a light gauze bandage over your incision may keep your staples from being rubbed or pulled. You may shower and replace the bandage daily. 8. ACTIVITIES:  You may resume regular (light) daily activities beginning the next day--such as daily self-care, walking, climbing stairs--gradually increasing activities as tolerated.  You may have sexual intercourse when it is comfortable.  Refrain from any heavy lifting or straining until approved by your doctor. a. You may drive when you no longer are taking prescription pain medication, you can comfortably wear a seatbelt, and you can safely maneuver your car and apply brakes b. Return to Work: ___________________________________ 9. You should see your doctor in the office for a follow-up appointment approximately two weeks after your surgery.  Make sure that you call for this appointment within a day or two after you arrive home to insure a convenient appointment time. OTHER INSTRUCTIONS:  _____________________________________________________________ _____________________________________________________________  WHEN TO CALL YOUR DOCTOR: 1. Fever over 101.0 2. Inability to urinate 3. Nausea and/or vomiting 4. Extreme swelling or bruising 5. Continued bleeding from incision. 6. Increased pain, redness, or drainage from the incision. 7. Difficulty swallowing or breathing 8. Muscle cramping or spasms. 9. Numbness or tingling in hands   or feet or around lips.  The clinic staff is available to answer your questions during regular business hours.  Please don't hesitate to call and ask to speak to one of the nurses if you have concerns.  For further questions, please visit  [email or web address]   

## 2011-09-18 ENCOUNTER — Encounter (HOSPITAL_COMMUNITY): Payer: Self-pay | Admitting: General Surgery

## 2011-09-20 ENCOUNTER — Telehealth (INDEPENDENT_AMBULATORY_CARE_PROVIDER_SITE_OTHER): Payer: Self-pay | Admitting: General Surgery

## 2011-09-20 NOTE — Telephone Encounter (Signed)
Patient called to make follow up appt with Dr Dwain Sarna. She is status post Exploratory laparoscopy and was released from the hospital on 09/15/11. Please call with follow up appt.

## 2011-09-21 ENCOUNTER — Telehealth (INDEPENDENT_AMBULATORY_CARE_PROVIDER_SITE_OTHER): Payer: Self-pay

## 2011-09-21 NOTE — Telephone Encounter (Signed)
LMOM for homehealth nurse stating that it's ok to do dressing changes of wet to dry changes. It's ok to help with pt getting supplies to help with the dressing changes. The pt has f/u appt with Dr Dwain Sarna on 10-09-11.

## 2011-09-21 NOTE — Telephone Encounter (Signed)
Called pt to make f/u appt with Dr Dwain Sarna. The appt is for 10-09-11.

## 2011-10-09 ENCOUNTER — Encounter (INDEPENDENT_AMBULATORY_CARE_PROVIDER_SITE_OTHER): Payer: Self-pay | Admitting: General Surgery

## 2011-10-09 ENCOUNTER — Ambulatory Visit (INDEPENDENT_AMBULATORY_CARE_PROVIDER_SITE_OTHER): Payer: No Typology Code available for payment source | Admitting: General Surgery

## 2011-10-09 ENCOUNTER — Telehealth (INDEPENDENT_AMBULATORY_CARE_PROVIDER_SITE_OTHER): Payer: Self-pay | Admitting: General Surgery

## 2011-10-09 VITALS — BP 116/78 | HR 88 | Resp 16 | Ht 59.0 in | Wt 144.0 lb

## 2011-10-09 DIAGNOSIS — Z09 Encounter for follow-up examination after completed treatment for conditions other than malignant neoplasm: Secondary | ICD-10-CM

## 2011-10-09 NOTE — Progress Notes (Signed)
Subjective:     Patient ID: Sav[facility], female   DOB: 01-31-1951, 61 y.o.   MRN: [id number]  HPI This is a 61 year old female who I did an exploratory laparotomy for small bowel obstruction. She remained in the hospital for a while afterwards. Her wound was treated with a VAC sponge. She was discharged home and is now tolerating a regular diet. She is having bowel movements. She is slowly returning to normal with her appetite as well as her energy level. She has a couple areas of her wound that are still draining a little bit. The bottommost area is draining a little bit more than the top area. She comes in today for followup. Review of Systems     Objective:   Physical Exam  Vitals reviewed. Abdominal:         Assessment:     S/p elap for sbo    Plan:     I applied silver nitrate to top portion today. I think bottom portion will heal on own.  If it drains or does not get better in next week she will call and may need to start packing this.

## 2011-10-27 ENCOUNTER — Ambulatory Visit (INDEPENDENT_AMBULATORY_CARE_PROVIDER_SITE_OTHER): Payer: No Typology Code available for payment source | Admitting: General Surgery

## 2011-10-27 ENCOUNTER — Encounter (INDEPENDENT_AMBULATORY_CARE_PROVIDER_SITE_OTHER): Payer: Self-pay | Admitting: General Surgery

## 2011-10-27 VITALS — BP 124/83 | HR 84 | Temp 97.0°F | Ht 59.0 in | Wt 142.4 lb

## 2011-10-27 DIAGNOSIS — Z09 Encounter for follow-up examination after completed treatment for conditions other than malignant neoplasm: Secondary | ICD-10-CM

## 2011-10-27 NOTE — Progress Notes (Signed)
Subjective:     Patient ID: Savannah Bell, female   DOB: 1951/04/07, 61 y.o.   MRN: [id number]  HPI This is a 61 year old female who I did an exploratory laparotomy for small bowel obstruction. She remained in the hospital for a while afterwards. Her wound was treated with a VAC sponge. She was discharged home and is now tolerating a regular diet. She is having bowel movements. . She has a couple areas of her wound that are still draining a little bit. These areas are both significantly improved today although draining small amount.  Review of Systems     Objective:   Physical Exam Healing wound with superficial opening near umbilicus and a 2 mm wound at inferior portion of incision, no infection, minimal drainage    Assessment:     S/p ex lap for sbo Open wound healing by secondary intention    Plan:     I silver nitrated these areas.  I think they should heal soon. Will see in one month.

## 2011-11-27 ENCOUNTER — Encounter (INDEPENDENT_AMBULATORY_CARE_PROVIDER_SITE_OTHER): Payer: Self-pay | Admitting: General Surgery

## 2011-11-27 ENCOUNTER — Ambulatory Visit (INDEPENDENT_AMBULATORY_CARE_PROVIDER_SITE_OTHER): Payer: No Typology Code available for payment source | Admitting: General Surgery

## 2011-11-27 VITALS — BP 128/70 | HR 78 | Temp 97.3°F | Resp 14 | Ht 59.0 in | Wt 145.1 lb

## 2011-11-27 DIAGNOSIS — Z09 Encounter for follow-up examination after completed treatment for conditions other than malignant neoplasm: Secondary | ICD-10-CM

## 2011-11-27 NOTE — Progress Notes (Signed)
Subjective:     Patient ID: Savannah Bell, female   DOB: 10-31-1950, 61 y.o.   MRN: [id number]  HPI This is a 61 year old female underwent a exploratory laparotomy with small bowel resection for a small bowel obstruction. She is taking a long time to heal. Her wound is finally healed and she is doing well without any significant complaints.  Review of Systems     Objective:   Physical Exam Healed incision without infection     Assessment:     S/p sbr for sbo     Plan:     Return to full activity Return to see me as needed

## 2013-02-20 IMAGING — CR DG CHEST 1V PORT
1 series · 2 of 2 positions shown · non-contrast
Comparison: None.

CLINICAL DATA: 60-year-old female status post PICC placement.

PORTABLE CHEST - 1 VIEW

[Series 1: AP · U · 2 of 2 slices shown]
[im 1/2]
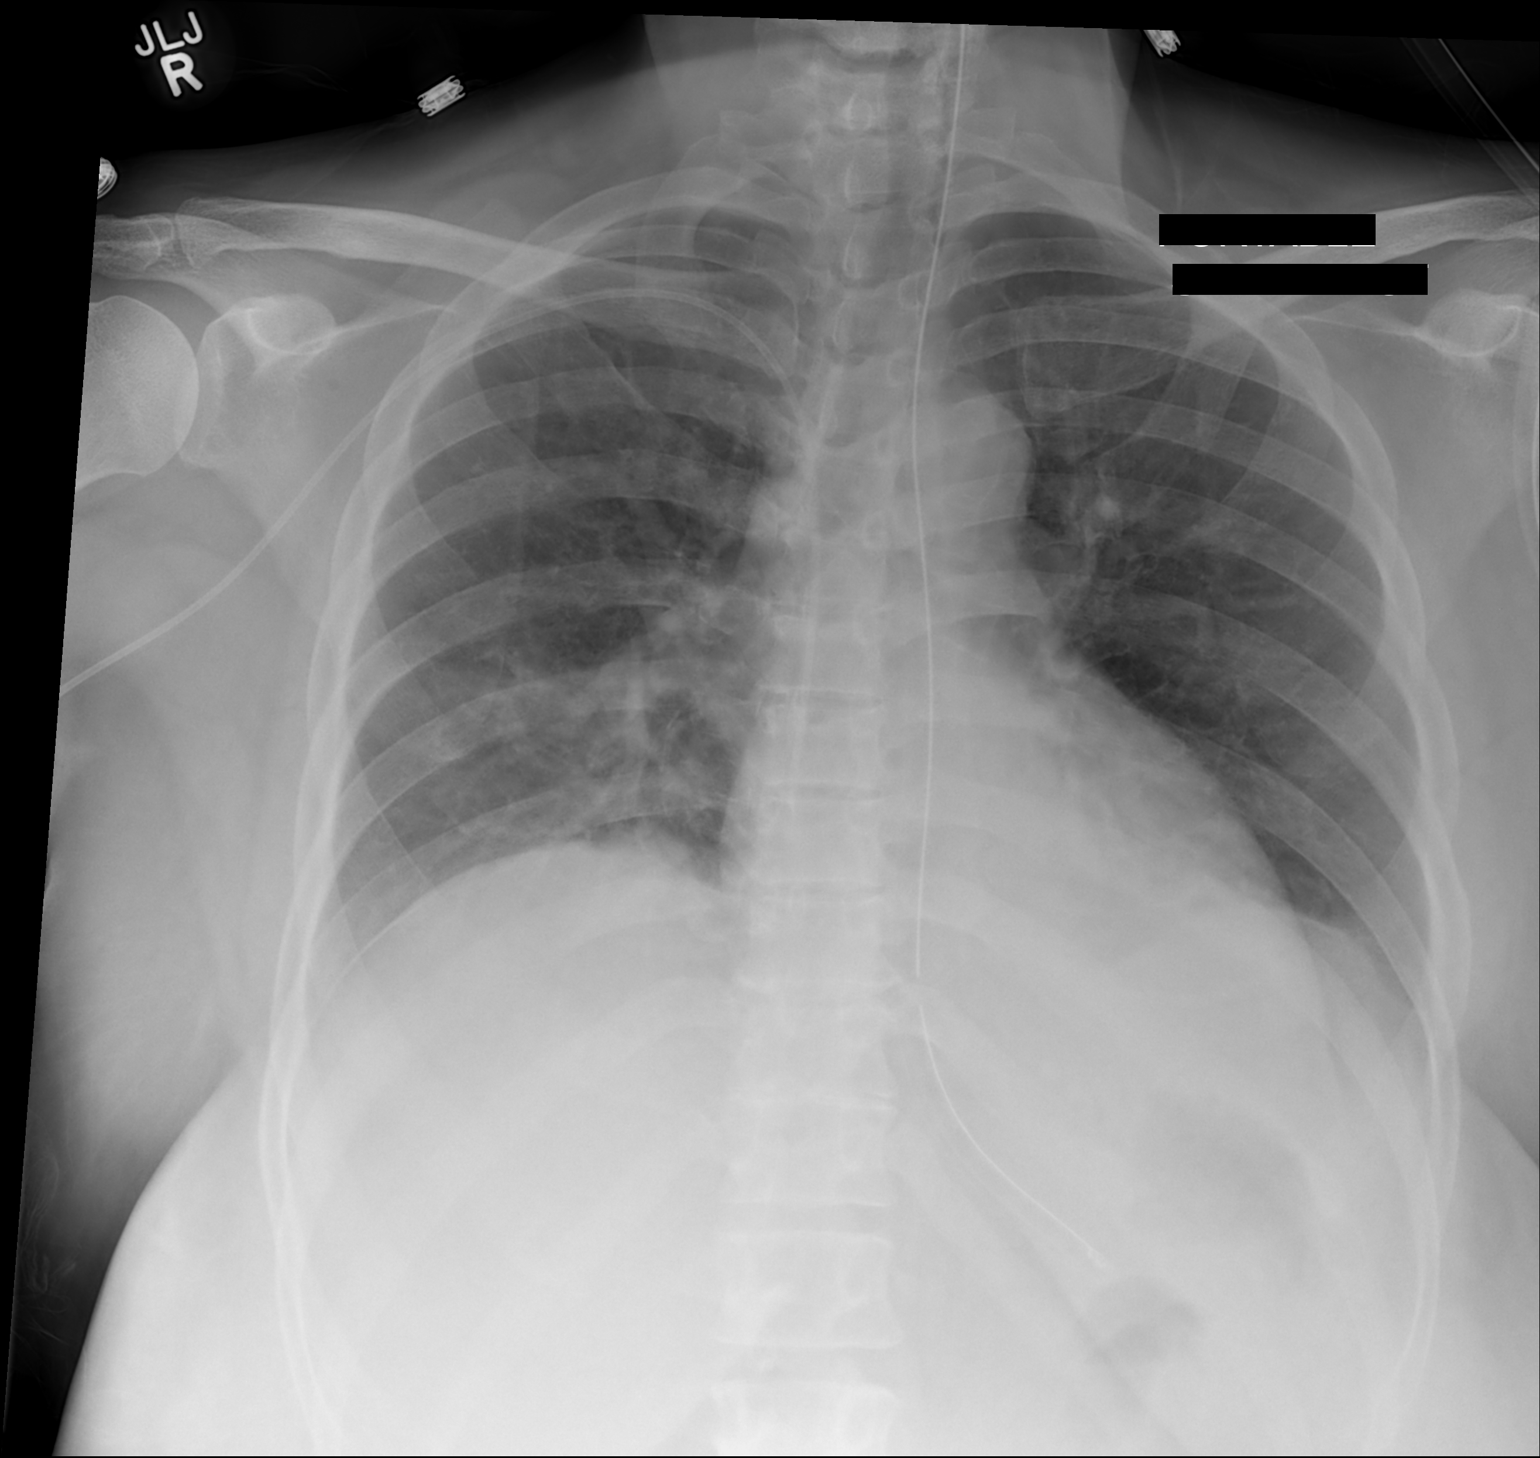
[im 2/2]
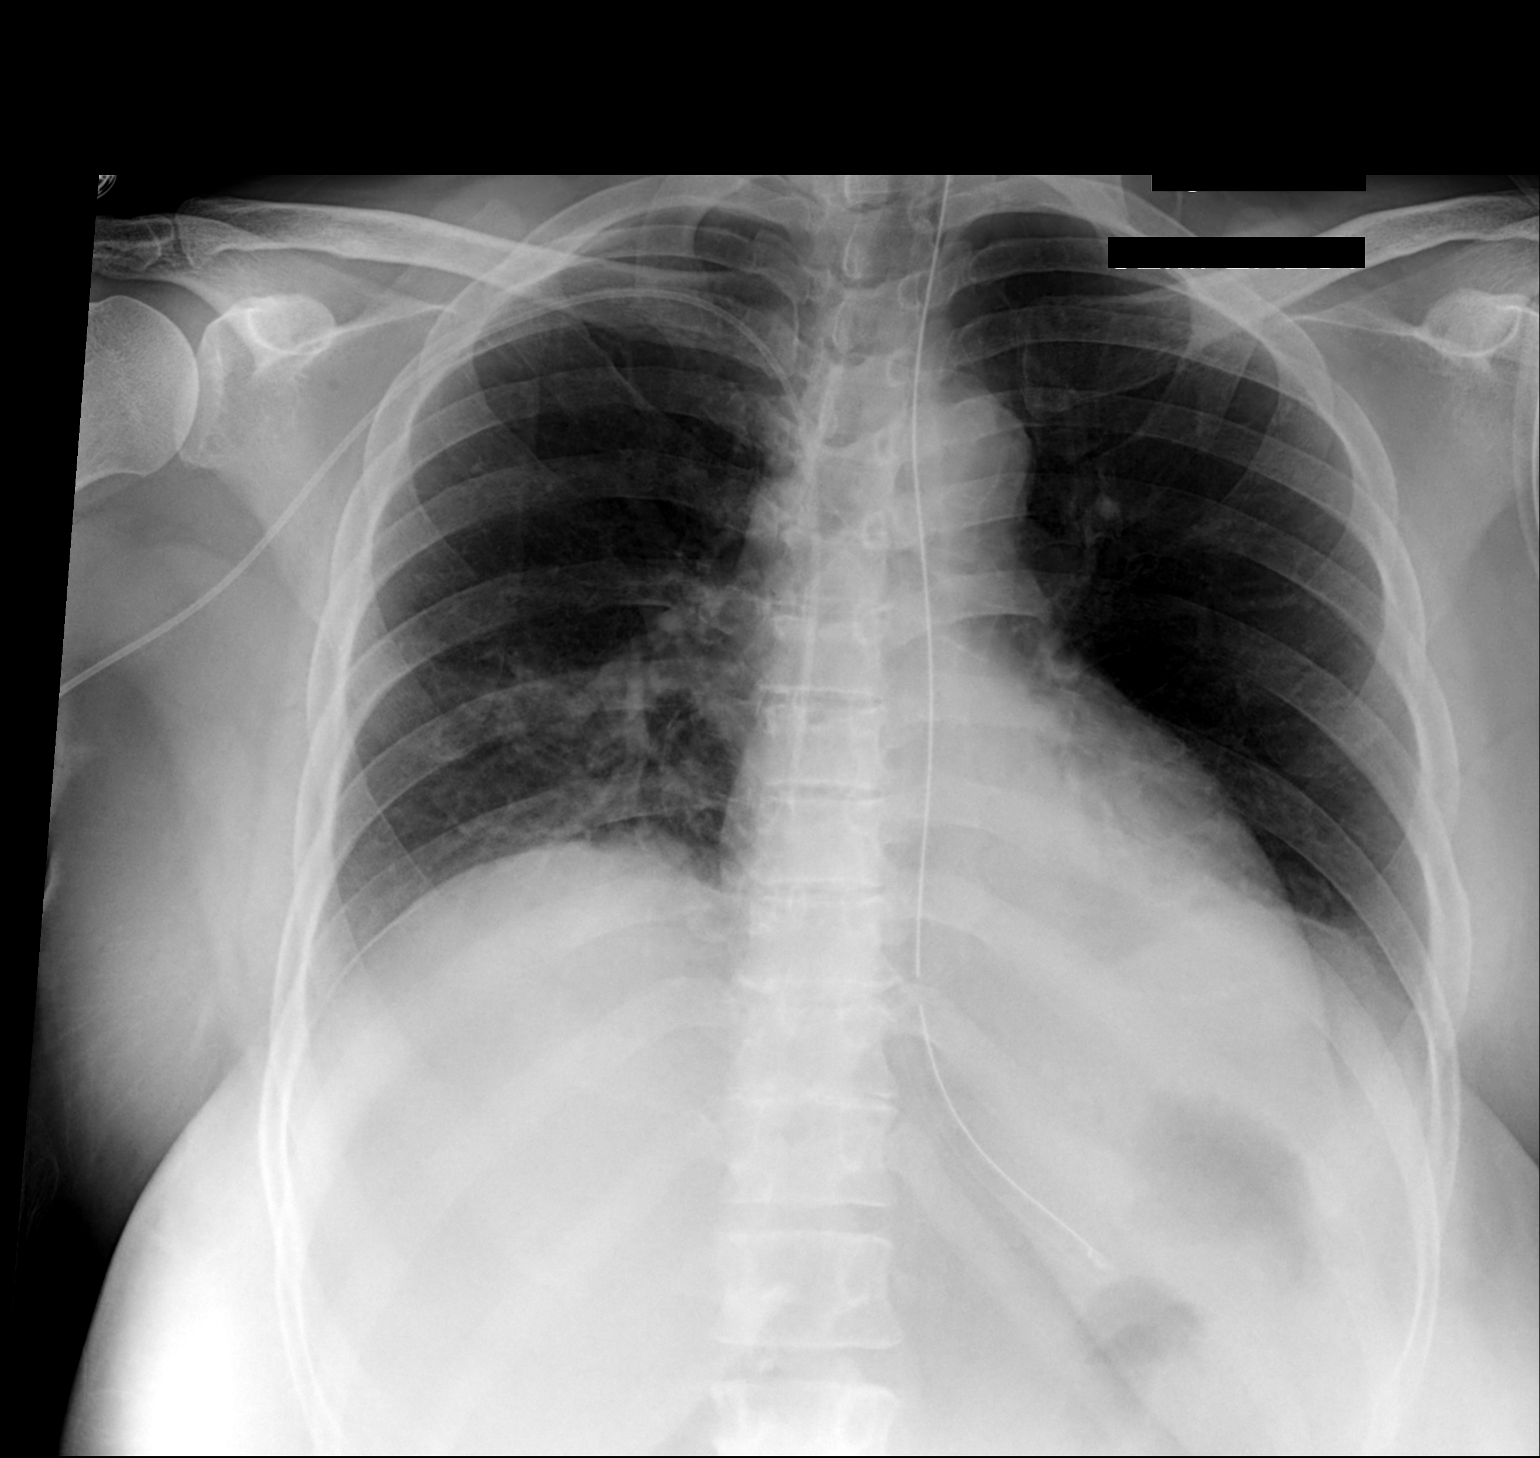

[2 of 2 positions shown; findings below may reference images not displayed]

FINDINGS: AP portable semi upright views of the chest at 6866
hours.  Enteric tube in place, side hole the level of the
gastroesophageal junction. Right upper extremity approach PICC line
catheter, tip at the level of the cavoatrial junction.

Increased retrocardiac opacity.  No pneumothorax, pulmonary edema
or effusion.  Cardiac size and mediastinal contours are within
normal limits.  }
IMPRESSION: 1. Right upper extremity approach PICC line catheter, tip at the
level of the cavoatrial junction.
2.  Recommend advancing enteric tube 5 cm to guarantee side hole
placement within the stomach.
3.  Confluent retrocardiac opacity could reflect atelectasis or
pneumonia.

## 2013-03-18 ENCOUNTER — Other Ambulatory Visit (HOSPITAL_COMMUNITY): Payer: Self-pay | Admitting: Nurse Practitioner

## 2013-03-18 DIAGNOSIS — Z1231 Encounter for screening mammogram for malignant neoplasm of breast: Secondary | ICD-10-CM

## 2013-03-25 ENCOUNTER — Ambulatory Visit (HOSPITAL_COMMUNITY)
Admission: RE | Admit: 2013-03-25 | Discharge: 2013-03-25 | Disposition: A | Discharge status: Home / Self Care | Payer: No Typology Code available for payment source | Source: Ambulatory Visit | Attending: Nurse Practitioner | Admitting: Nurse Practitioner

## 2013-03-25 DIAGNOSIS — Z1231 Encounter for screening mammogram for malignant neoplasm of breast: Secondary | ICD-10-CM

## 2016-01-27 ENCOUNTER — Other Ambulatory Visit: Payer: Self-pay | Admitting: Nurse Practitioner

## 2016-01-27 DIAGNOSIS — Z1231 Encounter for screening mammogram for malignant neoplasm of breast: Secondary | ICD-10-CM

## 2016-02-03 ENCOUNTER — Ambulatory Visit
Admission: RE | Admit: 2016-02-03 | Discharge: 2016-02-03 | Disposition: A | Discharge status: Home / Self Care | Payer: No Typology Code available for payment source | Source: Ambulatory Visit | Attending: Nurse Practitioner | Admitting: Nurse Practitioner

## 2016-02-03 DIAGNOSIS — Z1231 Encounter for screening mammogram for malignant neoplasm of breast: Secondary | ICD-10-CM

## 2017-12-11 ENCOUNTER — Other Ambulatory Visit: Payer: Self-pay

## 2017-12-11 ENCOUNTER — Encounter (HOSPITAL_COMMUNITY): Payer: Self-pay

## 2017-12-11 ENCOUNTER — Emergency Department (HOSPITAL_COMMUNITY): Payer: 59

## 2017-12-11 ENCOUNTER — Inpatient Hospital Stay (HOSPITAL_COMMUNITY)
Admission: EM | Admit: 2017-12-11 | Discharge: 2017-12-16 | DRG: 280 | Disposition: A | Discharge status: Home / Self Care | Payer: 59 | Attending: Cardiology | Admitting: Cardiology

## 2017-12-11 DIAGNOSIS — R0602 Shortness of breath: Secondary | ICD-10-CM | POA: Diagnosis not present

## 2017-12-11 DIAGNOSIS — Z7982 Long term (current) use of aspirin: Secondary | ICD-10-CM

## 2017-12-11 DIAGNOSIS — I214 Non-ST elevation (NSTEMI) myocardial infarction: Secondary | ICD-10-CM | POA: Diagnosis present

## 2017-12-11 DIAGNOSIS — Z6831 Body mass index (BMI) 31.0-31.9, adult: Secondary | ICD-10-CM | POA: Diagnosis not present

## 2017-12-11 DIAGNOSIS — Z79899 Other long term (current) drug therapy: Secondary | ICD-10-CM

## 2017-12-11 DIAGNOSIS — I509 Heart failure, unspecified: Secondary | ICD-10-CM

## 2017-12-11 DIAGNOSIS — I428 Other cardiomyopathies: Secondary | ICD-10-CM | POA: Diagnosis not present

## 2017-12-11 DIAGNOSIS — I5023 Acute on chronic systolic (congestive) heart failure: Secondary | ICD-10-CM | POA: Diagnosis present

## 2017-12-11 DIAGNOSIS — I081 Rheumatic disorders of both mitral and tricuspid valves: Secondary | ICD-10-CM | POA: Diagnosis present

## 2017-12-11 DIAGNOSIS — R748 Abnormal levels of other serum enzymes: Secondary | ICD-10-CM | POA: Diagnosis not present

## 2017-12-11 DIAGNOSIS — I519 Heart disease, unspecified: Secondary | ICD-10-CM | POA: Diagnosis not present

## 2017-12-11 DIAGNOSIS — E7800 Pure hypercholesterolemia, unspecified: Secondary | ICD-10-CM | POA: Diagnosis not present

## 2017-12-11 DIAGNOSIS — Z87891 Personal history of nicotine dependence: Secondary | ICD-10-CM

## 2017-12-11 DIAGNOSIS — I493 Ventricular premature depolarization: Secondary | ICD-10-CM | POA: Diagnosis present

## 2017-12-11 DIAGNOSIS — I420 Dilated cardiomyopathy: Secondary | ICD-10-CM | POA: Diagnosis present

## 2017-12-11 DIAGNOSIS — E876 Hypokalemia: Secondary | ICD-10-CM | POA: Diagnosis present

## 2017-12-11 DIAGNOSIS — I110 Hypertensive heart disease with heart failure: Secondary | ICD-10-CM | POA: Diagnosis present

## 2017-12-11 DIAGNOSIS — Z8241 Family history of sudden cardiac death: Secondary | ICD-10-CM | POA: Diagnosis not present

## 2017-12-11 DIAGNOSIS — R7982 Elevated C-reactive protein (CRP): Secondary | ICD-10-CM | POA: Diagnosis present

## 2017-12-11 DIAGNOSIS — I447 Left bundle-branch block, unspecified: Secondary | ICD-10-CM | POA: Diagnosis present

## 2017-12-11 DIAGNOSIS — E785 Hyperlipidemia, unspecified: Secondary | ICD-10-CM | POA: Diagnosis present

## 2017-12-11 DIAGNOSIS — R7989 Other specified abnormal findings of blood chemistry: Secondary | ICD-10-CM

## 2017-12-11 DIAGNOSIS — R778 Other specified abnormalities of plasma proteins: Secondary | ICD-10-CM

## 2017-12-11 DIAGNOSIS — I5021 Acute systolic (congestive) heart failure: Secondary | ICD-10-CM | POA: Diagnosis not present

## 2017-12-11 DIAGNOSIS — I340 Nonrheumatic mitral (valve) insufficiency: Secondary | ICD-10-CM | POA: Diagnosis not present

## 2017-12-11 DIAGNOSIS — E782 Mixed hyperlipidemia: Secondary | ICD-10-CM | POA: Diagnosis not present

## 2017-12-11 HISTORY — DX: Personal history of other medical treatment: Z92.89

## 2017-12-11 HISTORY — DX: Migraine, unspecified, not intractable, without status migrainosus: G43.909

## 2017-12-11 LAB — CBC WITH DIFFERENTIAL/PLATELET
Basophils Absolute: 0 10*3/uL (ref 0.0–0.1)
Basophils Relative: 0 %
Eosinophils Absolute: 0 10*3/uL (ref 0.0–0.7)
Eosinophils Relative: 0 %
HCT: 43 % (ref 36.0–46.0)
Hemoglobin: 14 g/dL (ref 12.0–15.0)
Lymphocytes Relative: 30 %
Lymphs Abs: 1 10*3/uL (ref 0.7–4.0)
MCH: 29 pg (ref 26.0–34.0)
MCHC: 32.6 g/dL (ref 30.0–36.0)
MCV: 89 fL (ref 78.0–100.0)
Monocytes Absolute: 0.2 10*3/uL (ref 0.1–1.0)
Monocytes Relative: 5 %
Neutro Abs: 2.1 10*3/uL (ref 1.7–7.7)
Neutrophils Relative %: 65 %
Platelets: 148 10*3/uL — ABNORMAL LOW (ref 150–400)
RBC: 4.83 MIL/uL (ref 3.87–5.11)
RDW: 13.3 % (ref 11.5–15.5)
WBC: 3.3 10*3/uL — ABNORMAL LOW (ref 4.0–10.5)

## 2017-12-11 LAB — HEMOGLOBIN A1C
Hgb A1c MFr Bld: 5.7 % — ABNORMAL HIGH (ref 4.8–5.6)
Mean Plasma Glucose: 116.89 mg/dL

## 2017-12-11 LAB — TROPONIN I: Troponin I: 0.22 ng/mL (ref <0.03)

## 2017-12-11 LAB — COMPREHENSIVE METABOLIC PANEL
ALT: 123 U/L — ABNORMAL HIGH (ref 14–54)
AST: 121 U/L — ABNORMAL HIGH (ref 15–41)
Albumin: 4.2 g/dL (ref 3.5–5.0)
Alkaline Phosphatase: 87 U/L (ref 38–126)
Anion gap: 9 (ref 5–15)
BUN: 14 mg/dL (ref 6–20)
CO2: 26 mmol/L (ref 22–32)
Calcium: 9 mg/dL (ref 8.9–10.3)
Chloride: 104 mmol/L (ref 101–111)
Creatinine, Ser: 0.87 mg/dL (ref 0.44–1.00)
GFR calc Af Amer: >60 mL/min (ref >60)
GFR calc non Af Amer: >60 mL/min (ref >60)
Glucose, Bld: 103 mg/dL — ABNORMAL HIGH (ref 65–99)
Potassium: 3.9 mmol/L (ref 3.5–5.1)
Sodium: 139 mmol/L (ref 135–145)
Total Bilirubin: 0.9 mg/dL (ref 0.3–1.2)
Total Protein: 7.4 g/dL (ref 6.5–8.1)

## 2017-12-11 LAB — I-STAT TROPONIN, ED: Troponin i, poc: 0.13 ng/mL (ref 0.00–0.08)

## 2017-12-11 LAB — BRAIN NATRIURETIC PEPTIDE: B Natriuretic Peptide: 902.1 pg/mL — ABNORMAL HIGH (ref 0.0–100.0)

## 2017-12-11 LAB — D-DIMER, QUANTITATIVE: D-Dimer, Quant: 1.51 ug/mL-FEU — ABNORMAL HIGH (ref 0.00–0.50)

## 2017-12-11 LAB — APTT: aPTT: 27 seconds (ref 24–36)

## 2017-12-11 LAB — I-STAT BETA HCG BLOOD, ED (MC, WL, AP ONLY): I-stat hCG, quantitative: <5 m[IU]/mL (ref <5)

## 2017-12-11 LAB — PROTIME-INR
INR: 0.95
Prothrombin Time: 12.6 seconds (ref 11.4–15.2)

## 2017-12-11 LAB — TSH: TSH: 0.933 u[IU]/mL (ref 0.350–4.500)

## 2017-12-11 MED ORDER — FUROSEMIDE 10 MG/ML IJ SOLN
40.0000 mg | Freq: Once | INTRAMUSCULAR | Status: AC
  Administered 2017-12-11: 40 mg via INTRAVENOUS
  Filled 2017-12-11: qty 4

## 2017-12-11 MED ORDER — ACETAMINOPHEN 325 MG PO TABS: 650.0000 mg | ORAL_TABLET | ORAL | Status: DC | PRN

## 2017-12-11 MED ORDER — IOPAMIDOL (ISOVUE-370) INJECTION 76%
100.0000 mL | Freq: Once | INTRAVENOUS | Status: AC | PRN
  Administered 2017-12-11: 80 mL via INTRAVENOUS

## 2017-12-11 MED ORDER — ATORVASTATIN CALCIUM 40 MG PO TABS
40.0000 mg | ORAL_TABLET | Freq: Every day | ORAL | Status: DC
  Administered 2017-12-11 – 2017-12-15 (×5): 40 mg via ORAL
  Filled 2017-12-11 (×5): qty 1

## 2017-12-11 MED ORDER — HEPARIN (PORCINE) IN NACL 100-0.45 UNIT/ML-% IJ SOLN
700.0000 [IU]/h | INTRAMUSCULAR | Status: DC
  Administered 2017-12-11: 700 [IU]/h via INTRAVENOUS
  Filled 2017-12-11: qty 250

## 2017-12-11 MED ORDER — ASPIRIN EC 81 MG PO TBEC
81.0000 mg | DELAYED_RELEASE_TABLET | Freq: Every day | ORAL | Status: DC
  Administered 2017-12-11 – 2017-12-13 (×3): 81 mg via ORAL
  Filled 2017-12-11 (×3): qty 1

## 2017-12-11 MED ORDER — HEPARIN BOLUS VIA INFUSION
3500.0000 [IU] | INTRAVENOUS | Status: AC
  Administered 2017-12-11: 3500 [IU] via INTRAVENOUS
  Filled 2017-12-11: qty 3500

## 2017-12-11 MED ORDER — NITROGLYCERIN 0.4 MG SL SUBL: 0.4000 mg | SUBLINGUAL_TABLET | SUBLINGUAL | Status: DC | PRN

## 2017-12-11 MED ORDER — FUROSEMIDE 10 MG/ML IJ SOLN
20.0000 mg | Freq: Once | INTRAMUSCULAR | Status: AC
  Administered 2017-12-11: 20 mg via INTRAVENOUS
  Filled 2017-12-11: qty 4

## 2017-12-11 MED ORDER — ASPIRIN EC 81 MG PO TBEC: 81.0000 mg | DELAYED_RELEASE_TABLET | Freq: Every day | ORAL | Status: DC

## 2017-12-11 MED ORDER — ALBUTEROL SULFATE (2.5 MG/3ML) 0.083% IN NEBU: 5.0000 mg | INHALATION_SOLUTION | Freq: Once | RESPIRATORY_TRACT | Status: DC

## 2017-12-11 MED ORDER — ONDANSETRON HCL 4 MG/2ML IJ SOLN: 4.0000 mg | Freq: Four times a day (QID) | INTRAMUSCULAR | Status: DC | PRN

## 2017-12-11 NOTE — Progress Notes (Signed)
ANTICOAGULATION CONSULT NOTE - Initial Consult  Pharmacy Consult for IV heparin Indication: ACS/NSTEMI  No Known Allergies  Patient Measurements: Height: 4\' 11"  (149.9 cm) Weight: 152 lb (68.9 kg) IBW/kg (Calculated) : 43.2 Heparin Dosing Weight: 58.5 kg  Vital Signs: Temp: 98 F (36.7 C) (05/28 1130) Temp Source: Oral (05/28 1130) BP: 119/68 (05/28 1500) Pulse Rate: 97 (05/28 1500)  Labs: Recent Labs    12/11/17 1250  HGB 14.0  HCT 43.0  PLT 148*  CREATININE 0.87    Estimated Creatinine Clearance: 53.7 mL/min (by C-G formula based on SCr of 0.87 mg/dL).   Medical History: Past Medical History:  Diagnosis Date  . Mitral valve prolapse      Assessment: 65 y/oF who [facility] 12[facility]11/2017 with SOB and elevated heart rate. D-dimer elevated. CTa of chest negative for PE. Troponin elevated at 0.13. Pharmacy consulted to start heparin infusion. Patient not on any anticoagulants PTA. Baseline CBC reveals Hgb/Hct WNL and Pltc slightly low at 148K.   Goal of Therapy:  Heparin level 0.3-0.7 units/ml Monitor platelets by anticoagulation protocol: Yes   Plan:   Baseline PT/INR, aPTT  Heparin 3500 units IV bolus x 1 STAT, then start heparin infusion at 700 units/hr  Heparin level 6 hours after heparin infusion started  Daily CBC (watch Pltc) and heparin level  Monitor closely for s/sx of bleeding   Greer Pickerel, PharmD, BCPS Pager: [phone] 12/11/2017 3:36 PM

## 2017-12-11 NOTE — ED Notes (Signed)
Attempted to call report to [facility]

## 2017-12-11 NOTE — Progress Notes (Signed)
Patient gave permission for her daughter to remain in the room while the nursing admission history was being done. Briscoe Burns BSN, RN-BC Admissions RN 12/11/2017 3:44 PM

## 2017-12-11 NOTE — ED Provider Notes (Signed)
[facility]-EMERGENCY DEPT Provider Note  CSN: [id number] Arrival date & time: 12/11/17 1121  Chief Complaint(s) Shortness of Breath  HPI Sav[facility] Bell is a 67 y.o. female   The history is provided by the patient.  Shortness of Breath  This is a new problem. Duration: 1.5 hrs. The problem occurs continuously.Episode onset: 4 hrs ago. The problem has been resolved. Associated symptoms include wheezing. Pertinent negatives include no fever, no headaches, no coryza, no rhinorrhea, no cough, no sputum production, no hemoptysis, no chest pain and no leg swelling. She has tried nothing for the symptoms. Associated medical issues do not include asthma, COPD, pneumonia, PE, heart failure, past MI or DVT.    Past Medical History Past Medical History:  Diagnosis Date  . Mitral valve prolapse    Patient Active Problem List   Diagnosis Date Noted  . Heart failure (HCC) 12/11/2017   Home Medication(s) Prior to Admission medications   Medication Sig Start Date End Date Taking? Authorizing Provider  aspirin 325 MG tablet Take 325 mg by mouth daily.   Yes [provider]  calcium gluconate 500 MG tablet Take 500 mg by mouth daily.   Yes [provider]  cholecalciferol (VITAMIN D) 1000 UNITS tablet Take 1,000 Units by mouth daily.   Yes [provider]  loratadine (CLARITIN) 10 MG tablet Take 10 mg by mouth daily as needed for allergies.   Yes [provider]                                                                                                                                    Past Surgical History Past Surgical History:  Procedure Laterality Date  . ABDOMINAL HYSTERECTOMY  1998  . APPENDECTOMY  2003  . APPLICATION OF WOUND VAC  09/05/2011   Procedure: APPLICATION OF WOUND VAC;  Surgeon: Emelia Loron, MD;  Location: [facility] ORS;  Service: General;  Laterality: N/A;  . BOWEL RESECTION  09/05/2011   Procedure: SMALL BOWEL  RESECTION;  Surgeon: Emelia Loron, MD;  Location: [facility];  Service: General;  Laterality: N/A;  . CESAREAN SECTION  1972  . LAPAROTOMY  09/05/2011   Procedure: EXPLORATORY LAPAROTOMY;  Surgeon: Emelia Loron, MD;  Location: [facility];  Service: General;  Laterality: N/A;  . TUBAL LIGATION  1974   Family History No family history on file.  Social History Social History   Tobacco Use  . Smoking status: Former Smoker    Packs/day: 0.30    Years: 10.00    Pack years: 3.00    Types: Cigarettes    Last attempt to quit: 09/01/1976    Years since quitting: 41.3  . Smokeless tobacco: Never Used  . Tobacco comment: Quit 35 yrs ago (approx)  Substance Use Topics  . Alcohol use: No  . Drug use: No   Allergies Patient has no known allergies.  Review of Systems Review of Systems  Constitutional: Negative for fever.  HENT: Negative for rhinorrhea.   Respiratory: Positive for shortness of breath and wheezing. Negative for cough, hemoptysis and sputum production.   Cardiovascular: Negative for chest pain and leg swelling.  Neurological: Negative for headaches.   All other systems are reviewed and are negative for acute change except as noted in the HPI  Physical Exam Vital Signs  I have reviewed the triage vital signs BP 126/79   Pulse 92   Temp 98 F (36.7 C) (Oral)   Resp (!) 21   Ht 4\' 11"  (1.499 m)   Wt 68.9 kg (152 lb)   SpO2 99%   BMI 30.70 kg/m   Physical Exam  Constitutional: She is oriented to person, place, and time. She appears well-developed and well-nourished. No distress.  HENT:  Head: Normocephalic and atraumatic.  Nose: Nose normal.  Eyes: Pupils are equal, round, and reactive to light. Conjunctivae and EOM are normal. Right eye exhibits no discharge. Left eye exhibits no discharge. No scleral icterus.  Neck: Normal range of motion. Neck supple.  Cardiovascular: Normal rate and regular rhythm. Exam reveals no gallop and no friction rub.  No murmur  heard. Pulmonary/Chest: Effort normal. No stridor. No respiratory distress. She has no wheezes. She has no rhonchi. She has rales in the right lower field and the left lower field.  Abdominal: Soft. She exhibits no distension. There is no tenderness.  Musculoskeletal: She exhibits no edema or tenderness.  Neurological: She is alert and oriented to person, place, and time.  Skin: Skin is warm and dry. No rash noted. She is not diaphoretic. No erythema.  Psychiatric: She has a normal mood and affect.  Vitals reviewed.   ED Results and Treatments Labs (all labs ordered are listed, but only abnormal results are displayed) Labs Reviewed  CBC WITH DIFFERENTIAL/PLATELET - Abnormal; Notable for the following components:      Result Value   WBC 3.3 (*)    Platelets 148 (*)    All other components within normal limits  COMPREHENSIVE METABOLIC PANEL - Abnormal; Notable for the following components:   Glucose, Bld 103 (*)    AST 121 (*)    ALT 123 (*)    All other components within normal limits  BRAIN NATRIURETIC PEPTIDE - Abnormal; Notable for the following components:   B Natriuretic Peptide 902.1 (*)    All other components within normal limits  D-DIMER, QUANTITATIVE (NOT AT [facility]) - Abnormal; Notable for the following components:   D-Dimer, Quant 1.51 (*)    All other components within normal limits  I-STAT TROPONIN, ED - Abnormal; Notable for the following components:   Troponin i, poc 0.13 (*)    All other components within normal limits  APTT  PROTIME-INR  HEPARIN LEVEL (UNFRACTIONATED)  I-STAT BETA HCG BLOOD, ED ([facility], [facility], [facility] ONLY)  EKG  EKG Interpretation  Date/Time:  Tuesday Dec 11 2017 11:30:05 EDT Ventricular Rate:  105 PR Interval:    QRS Duration: 109 QT Interval:  362 QTC Calculation: 479 R Axis:   38 Text Interpretation:  Sinus tachycardia Multiform  ventricular premature complexes Probable left atrial enlargement Incomplete left bundle branch block NO STEMI Confirmed by Drema Pry [phone]) on 12/11/2017 11:37:37 AM      Radiology Dg Chest 2 View  Result Date: 12/11/2017 CLINICAL DATA:  Shortness of breath EXAM: CHEST - 2 VIEW COMPARISON:  09/06/2011 FINDINGS: Mild enlargement of the cardiopericardial silhouette with upper zone pulmonary vascular indistinctness and Kerley B lines indicating interstitial edema. Bibasilar subsegmental atelectasis. No significant pleural effusion. IMPRESSION: 1. Appearance compatible with mild to moderate congestive heart failure, with mild enlargement of the cardiopericardial silhouette, pulmonary venous hypertension, and interstitial pulmonary edema. Electronically Signed   By: Gaylyn Rong M.D.   On: 12/11/2017 12:14   Ct Angio Chest Pe W And/or Wo Contrast  Result Date: 12/11/2017 CLINICAL DATA:  Acute presentation with shortness of breath and tachycardia. EXAM: CT ANGIOGRAPHY CHEST WITH CONTRAST TECHNIQUE: Multidetector CT imaging of the chest was performed using the standard protocol during bolus administration of intravenous contrast. Multiplanar CT image reconstructions and MIPs were obtained to evaluate the vascular anatomy. CONTRAST:  80mL ISOVUE-370 IOPAMIDOL (ISOVUE-370) INJECTION 76% COMPARISON:  Chest radiography same day FINDINGS: Cardiovascular: Pulmonary arterial opacification is good. There are no pulmonary emboli. There is mild aortic atherosclerosis. No sign of aneurysm. Heart size is upper limits of normal, with a dilated left ventricle. No pericardial fluid. Mediastinum/Nodes: No mass or lymphadenopathy. Lungs/Pleura: Bilateral pleural effusions layering dependently with dependent atelectasis. There is interstitial pulmonary edema and early alveolar edema. Upper Abdomen: Negative Musculoskeletal: Ordinary spinal degenerative changes. 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 Review of  the MIP images confirms the above findings. IMPRESSION: Findings consistent with congestive heart failure. Dilated left ventricle. Bilateral pleural effusions layering dependently. Interstitial and early alveolar pulmonary edema. No pulmonary emboli. Mild aortic atherosclerosis. No visible coronary artery calcification. Electronically Signed   By: Paulina Fusi M.D.   On: 12/11/2017 14:59   Pertinent labs & imaging results that were available during my care of the patient were reviewed by me and considered in my medical decision making (see chart for details).  Medications Ordered in ED Medications  heparin bolus via infusion 3,500 Units (3,500 Units Intravenous Bolus from Bag 12/11/17 1600)    Followed by  heparin ADULT infusion 100 units/mL (25000 units/265mL sodium chloride 0.45%) (700 Units/hr Intravenous New Bag/Given 12/11/17 1600)  iopamidol (ISOVUE-370) 76 % injection 100 mL (80 mLs Intravenous Contrast Given 12/11/17 1431)  furosemide (LASIX) injection 20 mg (20 mg Intravenous Given 12/11/17 1552)  Procedures Procedures CRITICAL CARE Performed by: Amadeo Garnet Cardama Total critical care time: 45 minutes Critical care time was exclusive of separately billable procedures and treating other patients. Critical care was necessary to treat or prevent imminent or life-threatening deterioration. Critical care was time spent personally by me on the following activities: development of treatment plan with patient and/or surrogate as well as nursing, discussions with consultants, evaluation of patient's response to treatment, examination of patient, obtaining history from patient or surrogate, ordering and performing treatments and interventions, ordering and review of laboratory studies, ordering and review of radiographic studies, pulse oximetry and re-evaluation of  patient's condition.   (including critical care time)  Medical Decision Making / ED Course I have reviewed the nursing notes for this encounter and the patient's prior records (if available in EHR or on provided paperwork).    EKG without acute ischemic changes or evidence of pericarditis.  Did reveal frequent PVCs and incomplete left bundle branch block.  Initial troponin elevated at 0.13.   patient denied any chest pain during this episode so it is unlikely that this is ACS however patient started on heparin drip.  Chest x-ray with evidence of cardiomegaly and pulmonary edema concerning for heart failure.  BNP at 900, confirming heart failure.  D-dimer elevated and CTA was obtained which ruled out pulmonary embolism.  Patient was given Lasix for heart failure.  Patient is hemodynamically stable, satting well on room air, in no acute distress.  Case was discussed with cardiology for admission, continue work-up and management.  Final Clinical Impression(s) / ED Diagnoses Final diagnoses:  Acute heart failure, unspecified heart failure type (HCC)  Elevated troponin      This chart was dictated using voice recognition software.  Despite best efforts to proofread,  errors can occur which can change the documentation meaning.   Nira Conn, MD 12/11/17 [phone]

## 2017-12-11 NOTE — ED Notes (Signed)
ED TO INPATIENT HANDOFF REPORT  Name/Age/Gender Savannah Bell 67 y.o. female  Code Status Code Status History    Date Active Date Inactive Code Status Order ID Comments User Context   09/02/2011 1348 09/05/2011 1854 Full Code [id number]  Sandi Raveling, RN Inpatient      Home/SNF/Other Home  Chief Complaint SOB / fast heart rate   Level of Care/Admitting Diagnosis ED Disposition    ED Disposition Condition [place]: [place] [[id number]]  Level of Care: Telemetry [5]  Diagnosis: NSTEMI (non-ST elevated myocardial infarction) [facility]) [628366]  Admitting Physician: Dorothy Spark [[id number]]  Attending Physician: Dorothy Spark [[id number]]  Estimated length of stay: past midnight tomorrow  Certification:: I certify this patient will need inpatient services for at least 2 midnights  PT Class (Do Not Modify): Inpatient [101]  PT Acc Code (Do Not Modify): Private [1]       Medical History Past Medical History:  Diagnosis Date  . Mitral valve prolapse     Allergies No Known Allergies  IV Location/Drains/Wounds Patient Lines/Drains/Airways Status   Active Line/Drains/Airways    Name:   Placement date:   Placement time:   Site:   Days:   Peripheral IV 12/11/17 Right Antecubital   12/11/17    1323    Antecubital   less than 1   Incision 09/05/11 Abdomen Other (Comment)   09/05/11    1727     2289   Incision 09/05/11 Abdomen Other (Comment)   09/05/11    1727     2289          Labs/Imaging Results for orders placed or performed during the hospital encounter of 12/11/17 (from the past 48 hour(s))  CBC with Differential/Platelet     Status: Abnormal   Collection Time: 12/11/17 12:50 PM  Result Value Ref Range   WBC 3.3 (L) 4.0 - 10.5 K/uL   RBC 4.83 3.87 - 5.11 MIL/uL   Hemoglobin 14.0 12.0 - 15.0 g/dL   HCT 43.0 36.0 - 46.0 %   MCV 89.0 78.0 - 100.0 fL   MCH 29.0 26.0 - 34.0 pg   MCHC 32.6 30.0 - 36.0 g/dL   RDW  13.3 11.5 - 15.5 %   Platelets 148 (L) 150 - 400 K/uL   Neutrophils Relative % 65 %   Neutro Abs 2.1 1.7 - 7.7 K/uL   Lymphocytes Relative 30 %   Lymphs Abs 1.0 0.7 - 4.0 K/uL   Monocytes Relative 5 %   Monocytes Absolute 0.2 0.1 - 1.0 K/uL   Eosinophils Relative 0 %   Eosinophils Absolute 0.0 0.0 - 0.7 K/uL   Basophils Relative 0 %   Basophils Absolute 0.0 0.0 - 0.1 K/uL    Comment: Performed at [facility], [place] [address], [place],  [address]  Comprehensive metabolic panel     Status: Abnormal   Collection Time: 12/11/17 12:50 PM  Result Value Ref Range   Sodium 139 135 - 145 mmol/L   Potassium 3.9 3.5 - 5.1 mmol/L   Chloride 104 101 - 111 mmol/L   CO2 26 22 - 32 mmol/L   Glucose, Bld 103 (H) 65 - 99 mg/dL   BUN 14 6 - 20 mg/dL   Creatinine, Ser 0.87 0.44 - 1.00 mg/dL   Calcium 9.0 8.9 - 10.3 mg/dL   Total Protein 7.4 6.5 - 8.1 g/dL   Albumin 4.2 3.5 - 5.0 g/dL   AST  121 (H) 15 - 41 U/L   ALT 123 (H) 14 - 54 U/L   Alkaline Phosphatase 87 38 - 126 U/L   Total Bilirubin 0.9 0.3 - 1.2 mg/dL   GFR calc non Af Amer >60 >60 mL/min   GFR calc Af Amer >60 >60 mL/min    Comment: (NOTE) The eGFR has been calculated using the CKD EPI equation. This calculation has not been validated in all clinical situations. eGFR's persistently <60 mL/min signify possible Chronic Kidney Disease.    Anion gap 9 5 - 15    Comment: Performed at [facility], [place] [address], [place], [place] [address]  Brain natriuretic peptide     Status: Abnormal   Collection Time: 12/11/17 12:50 PM  Result Value Ref Range   B Natriuretic Peptide 902.1 (H) 0.0 - 100.0 pg/mL    Comment: Performed at [facility], [place] [address], [place], [place] [address]  D-dimer, quantitative (not at [facility])     Status: Abnormal   Collection Time: 12/11/17 12:50 PM  Result Value Ref Range   D-Dimer, Quant 1.51 (H) 0.00 - 0.50 ug/mL-FEU    Comment: (NOTE) At the  manufacturer cut-off of 0.50 ug/mL FEU, this assay has been documented to exclude PE with a sensitivity and negative predictive value of 97 to 99%.  At this time, this assay has not been approved by the FDA to exclude DVT/VTE. Results should be correlated with clinical presentation. Performed at [facility], [place] [address], [place], [place] [address]   APTT     Status: None   Collection Time: 12/11/17 12:50 PM  Result Value Ref Range   aPTT 27 24 - 36 seconds    Comment: Performed at [facility], [place] [address] [place], [place] [address]  Protime-INR     Status: None   Collection Time: 12/11/17 12:50 PM  Result Value Ref Range   Prothrombin Time 12.6 11.4 - 15.2 seconds   INR 0.95     Comment: Performed at [facility], [place] [address] [place], [place] [address]  I-stat troponin, ED     Status: Abnormal   Collection Time: 12/11/17 12:55 PM  Result Value Ref Range   Troponin i, poc 0.13 (HH) 0.00 - 0.08 ng/mL   Comment NOTIFIED PHYSICIAN    Comment 3            Comment: Due to the release kinetics of cTnI, a negative result within the first hours of the onset of symptoms does not rule out myocardial infarction with certainty. If myocardial infarction is still suspected, repeat the test at appropriate intervals.   I-Stat beta hCG blood, ED ([facility], [facility], [facility] only)     Status: None   Collection Time: 12/11/17  1:53 PM  Result Value Ref Range   I-stat hCG, quantitative <5.0 <5 mIU/mL   Comment 3            Comment:   GEST. AGE      CONC.  (mIU/mL)   <=1 WEEK        5 - 50     2 WEEKS       50 - 500     3 WEEKS       100 - 10,000     4 WEEKS     1,000 - 30,000        FEMALE AND NON-PREGNANT FEMALE:     LESS THAN 5 mIU/mL  Dg Chest 2 View  Result Date: 12/11/2017 CLINICAL DATA:  Shortness of breath EXAM: CHEST - 2 VIEW COMPARISON:  09/06/2011 FINDINGS: Mild enlargement of the cardiopericardial silhouette with upper zone  pulmonary vascular indistinctness and Kerley B lines indicating interstitial edema. Bibasilar subsegmental atelectasis. No significant pleural effusion. IMPRESSION: 1. Appearance compatible with mild to moderate congestive heart failure, with mild enlargement of the cardiopericardial silhouette, pulmonary venous hypertension, and interstitial pulmonary edema. Electronically Signed   By: Van Clines M.D.   On: 12/11/2017 12:14   Ct Angio Chest Pe W And/or Wo Contrast  Result Date: 12/11/2017 CLINICAL DATA:  Acute presentation with shortness of breath and tachycardia. EXAM: CT ANGIOGRAPHY CHEST WITH CONTRAST TECHNIQUE: Multidetector CT imaging of the chest was performed using the standard protocol during bolus administration of intravenous contrast. Multiplanar CT image reconstructions and MIPs were obtained to evaluate the vascular anatomy. CONTRAST:  72m ISOVUE-370 IOPAMIDOL (ISOVUE-370) INJECTION 76% COMPARISON:  Chest radiography same day FINDINGS: Cardiovascular: Pulmonary arterial opacification is good. There are no pulmonary emboli. There is mild aortic atherosclerosis. No sign of aneurysm. Heart size is upper limits of normal, with a dilated left ventricle. No pericardial fluid. Mediastinum/Nodes: No mass or lymphadenopathy. Lungs/Pleura: Bilateral pleural effusions layering dependently with dependent atelectasis. There is interstitial pulmonary edema and early alveolar edema. Upper Abdomen: Negative Musculoskeletal: Ordinary spinal degenerative changes. 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 Review of the MIP images confirms the above findings. IMPRESSION: Findings consistent with congestive heart failure. Dilated left ventricle. Bilateral pleural effusions layering dependently. Interstitial and early alveolar pulmonary edema. No pulmonary emboli. Mild aortic atherosclerosis. No visible coronary artery calcification. Electronically Signed   By: MNelson ChimesM.D.   On: 12/11/2017 14:59     Pending Labs Unresulted Labs (From admission, onward)   Start     Ordered   12/12/17 0500  CBC  Daily,   R     12/11/17 1549   12/11/17 2200  Heparin level (unfractionated)  Once-Timed,   R     12/11/17 1550   Signed and Held  HIV antibody (Routine Testing)  Once,   R     Signed and Held   Signed and Held  TSH  Once,   R     Signed and Held   Signed and Held  Troponin I  Now then every 6 hours,   STAT     Signed and Held   Signed and Held  Hemoglobin A1c  Once,   R     Signed and Held   Signed and Held  Basic metabolic panel  Tomorrow morning,   R     Signed and Held   Signed and Held  Lipid panel  Tomorrow morning,   R     Signed and Held   Signed and Held  CBC  Tomorrow morning,   R     Signed and Held      Vitals/Pain Today's Vitals   12/11/17 1600 12/11/17 1730 12/11/17 1830 12/11/17 1905  BP: 121/83 120/79 (!) 123/92 115/71  Pulse: 93 88 95 92  Resp: 20 (!) 25 17 (!) 21  Temp:      TempSrc:      SpO2: 99% 99% 99% 98%  Weight:      Height:      PainSc:        Isolation Precautions No active isolations  Medications Medications  heparin bolus via infusion 3,500 Units (3,500 Units Intravenous Bolus from Bag 12/11/17 1600)  Followed by  heparin ADULT infusion 100 units/mL (25000 units/240m sodium chloride 0.45%) (700 Units/hr Intravenous New Bag/Given 12/11/17 1600)  iopamidol (ISOVUE-370) 76 % injection 100 mL (80 mLs Intravenous Contrast Given 12/11/17 1431)  furosemide (LASIX) injection 20 mg (20 mg Intravenous Given 12/11/17 1552)    Mobility walks

## 2017-12-11 NOTE — H&P (Addendum)
Cardiology Admission History and Physical:   Patient ID: Savannah Bell; MRN: [id number]; DOB: 1950/10/16   Admission date: 12/11/2017  Primary Care Provider: [name], NP (Inactive) Primary Cardiologist: New to Dr. Delton See   Chief Complaint:  SOB  Patient Profile:   Savannah Bell is a 67 y.o. female with no significant past medical history presented for evaluation of acute onset shortness of breath.  History of Present Illness:   Savannah Bell was in usual state of health up until this morning when she woke up with acute onset shortness of breath.  She works at night at [employer].  She went to bed around 6:30 AM however, woke up at 730 with acute onset shortness of breath.  She describes her shortness of breath as substernal area and  unable to take a deep breath.  No associated radiation, palpitation, nausea or vomiting.  Her symptoms lasted approximately 2 hours at intensity of 8-9 out of 10.  By the time she went to see her PCP her symptoms were completely resolved.  EKG at PCP office (personally reviewed (close) sinus rhythm at rate of 101 bpm, incomplete left bundle branch block and T wave inversion in lateral leads. She was sent to the ER for further evaluation.  D-dimer was elevated at 1.51.  Follow-up CT angiogram chest did not showed pulmonary embolism however evidence of CHF.  BNP 902.  Point-of-care troponin 0.13.  Patient was given IV Lasix and started on IV heparin.  She continues to have intermittent brief episode of shortness of breath with less intensity.  Patient denies any prior cardiac history.  No history of hypertension, hyperlipidemia or diabetes.  Remote history of tobacco smoking, quit 30 years ago.  She might have smoke on and off for 10 to 15 years.  Denies illicit drug use or alcohol abuse.  Her mother died on table during some type of cardiac surgery.   Past Medical History:  Diagnosis Date  . Mitral valve prolapse     Past Surgical History:  Procedure  Laterality Date  . ABDOMINAL HYSTERECTOMY  1998  . APPENDECTOMY  2003  . APPLICATION OF WOUND VAC  09/05/2011   Procedure: APPLICATION OF WOUND VAC;  Surgeon: Emelia Loron, MD;  Location: [facility];  Service: General;  Laterality: N/A;  . BOWEL RESECTION  09/05/2011   Procedure: SMALL BOWEL RESECTION;  Surgeon: Emelia Loron, MD;  Location: [facility];  Service: General;  Laterality: N/A;  . CESAREAN SECTION  1972  . LAPAROTOMY  09/05/2011   Procedure: EXPLORATORY LAPAROTOMY;  Surgeon: Emelia Loron, MD;  Location: [facility];  Service: General;  Laterality: N/A;  . TUBAL LIGATION  1974     Medications Prior to Admission: Prior to Admission medications   Medication Sig Start Date End Date Taking? Authorizing Provider  aspirin 325 MG tablet Take 325 mg by mouth daily.   Yes [provider]  calcium gluconate 500 MG tablet Take 500 mg by mouth daily.   Yes [provider]  cholecalciferol (VITAMIN D) 1000 UNITS tablet Take 1,000 Units by mouth daily.   Yes [provider]  loratadine (CLARITIN) 10 MG tablet Take 10 mg by mouth daily as needed for allergies.   Yes [provider]     Allergies:   No Known Allergies  Social History:   Social History   Socioeconomic History  . Marital status: Single    Spouse name: Not on file  . Number of children: Not on file  . Years  of education: Not on file  . Highest education level: Not on file  Occupational History  . Not on file  Social Needs  . Financial resource strain: Not on file  . Food insecurity:    Worry: Not on file    Inability: Not on file  . Transportation needs:    Medical: Not on file    Non-medical: Not on file  Tobacco Use  . Smoking status: Former Smoker    Packs/day: 0.30    Years: 10.00    Pack years: 3.00    Types: Cigarettes    Last attempt to quit: 09/01/1976    Years since quitting: 41.3  . Smokeless tobacco: Never Used  . Tobacco comment: Quit 35 yrs ago (approx)    Substance and Sexual Activity  . Alcohol use: No  . Drug use: No  . Sexual activity: Not on file  Lifestyle  . Physical activity:    Days per week: Not on file    Minutes per session: Not on file  . Stress: Not on file  Relationships  . Social connections:    Talks on phone: Not on file    Gets together: Not on file    Attends religious service: Not on file    Active member of club or organization: Not on file    Attends meetings of clubs or organizations: Not on file    Relationship status: Not on file  . Intimate partner violence:    Fear of current or ex partner: Not on file    Emotionally abused: Not on file    Physically abused: Not on file    Forced sexual activity: Not on file  Other Topics Concern  . Not on file  Social History Narrative  . Not on file    Family History:   The patient's family history includes Sudden Cardiac Death in her mother.    ROS:  Please see the history of present illness.  All other ROS reviewed and negative.     Physical Exam/Data:   Vitals:   12/11/17 1300 12/11/17 1400 12/11/17 1500 12/11/17 1530  BP: 114/75 133/87 119/68 126/79  Pulse: 84 95 97 92  Resp: 17 19 19  (!) 21  Temp:      TempSrc:      SpO2: 100% 100% 96% 99%  Weight:      Height:       No intake or output data in the 24 hours ending 12/11/17 1619 Filed Weights   12/11/17 1129  Weight: 152 lb (68.9 kg)   Body mass index is 30.7 kg/m.  General:  Well nourished, well developed, in no acute distress HEENT: normal Lymph: no adenopathy Neck: +JVD Endocrine:  No thryomegaly Vascular: No carotid bruits; FA pulses 2+ bilaterally without bruits  Cardiac:  normal S1, S2; RRR; no murmur  Lungs:  clear to auscultation bilaterally, no wheezing, rhonchi or rales  Abd: soft, nontender, no hepatomegaly  Ext: no edema Musculoskeletal:  No deformities, BUE and BLE strength normal and equal Skin: warm and dry  Neuro:  CNs 2-12 intact, no focal abnormalities noted Psych:   Normal affect    EKG:  The ECG that was done today was personally reviewed and demonstrates sinus rhythm at rate of 105 bpm, nonspecific T wave abnormality in lateral leads and incomplete left bundle branch block  Relevant CV Studies: As above  Laboratory Data:  Chemistry Recent Labs  Lab 12/11/17 1250  NA 139  K 3.9  CL 104  CO2 26  GLUCOSE 103*  BUN 14  CREATININE 0.87  CALCIUM 9.0  GFRNONAA >60  GFRAA >60  ANIONGAP 9    Recent Labs  Lab 12/11/17 1250  PROT 7.4  ALBUMIN 4.2  AST 121*  ALT 123*  ALKPHOS 87  BILITOT 0.9   Hematology Recent Labs  Lab 12/11/17 1250  WBC 3.3*  RBC 4.83  HGB 14.0  HCT 43.0  MCV 89.0  MCH 29.0  MCHC 32.6  RDW 13.3  PLT 148*    Recent Labs  Lab 12/11/17 1255  TROPIPOC 0.13*    BNP Recent Labs  Lab 12/11/17 1250  BNP 902.1*    DDimer  Recent Labs  Lab 12/11/17 1250  DDIMER 1.51*    Radiology/Studies:  Dg Chest 2 View  Result Date: 12/11/2017 CLINICAL DATA:  Shortness of breath EXAM: CHEST - 2 VIEW COMPARISON:  09/06/2011 FINDINGS: Mild enlargement of the cardiopericardial silhouette with upper zone pulmonary vascular indistinctness and Kerley B lines indicating interstitial edema. Bibasilar subsegmental atelectasis. No significant pleural effusion. IMPRESSION: 1. Appearance compatible with mild to moderate congestive heart failure, with mild enlargement of the cardiopericardial silhouette, pulmonary venous hypertension, and interstitial pulmonary edema. Electronically Signed   By: Gaylyn Rong M.D.   On: 12/11/2017 12:14   Ct Angio Chest Pe W And/or Wo Contrast  Result Date: 12/11/2017 CLINICAL DATA:  Acute presentation with shortness of breath and tachycardia. EXAM: CT ANGIOGRAPHY CHEST WITH CONTRAST TECHNIQUE: Multidetector CT imaging of the chest was performed using the standard protocol during bolus administration of intravenous contrast. Multiplanar CT image reconstructions and MIPs were obtained to  evaluate the vascular anatomy. CONTRAST:  80mL ISOVUE-370 IOPAMIDOL (ISOVUE-370) INJECTION 76% COMPARISON:  Chest radiography same day FINDINGS: Cardiovascular: Pulmonary arterial opacification is good. There are no pulmonary emboli. There is mild aortic atherosclerosis. No sign of aneurysm. Heart size is upper limits of normal, with a dilated left ventricle. No pericardial fluid. Mediastinum/Nodes: No mass or lymphadenopathy. Lungs/Pleura: Bilateral pleural effusions layering dependently with dependent atelectasis. There is interstitial pulmonary edema and early alveolar edema. Upper Abdomen: Negative Musculoskeletal: Ordinary spinal degenerative changes. 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 Review of the MIP images confirms the above findings. IMPRESSION: Findings consistent with congestive heart failure. Dilated left ventricle. Bilateral pleural effusions layering dependently. Interstitial and early alveolar pulmonary edema. No pulmonary emboli. Mild aortic atherosclerosis. No visible coronary artery calcification. Electronically Signed   By: Paulina Fusi M.D.   On: 12/11/2017 14:59    Assessment and Plan:   1. Acute CHF, presume diastolic - BNP 902.  X-ray and CT of the chest showed evidence of congestive heart failure.  She was given IV Lasix 20 mg.  Breathing somewhat improved but continues to have intermittent shortness of breath that hard to differentiate from chest pressure. Will give one more dose of IV lasix 40mg  tonight. Strict I & O and daily weight.  -Echocardiogram.  2.  Non-STEMI -Point of care troponin 0.13.  Cycle troponin.  IV heparin started in the emergency room.  Will continue. -EKG with non-specific St/ T wave changes and ILBBB. Further ischemic evaluation depending on enzyme trend.  Keep n.p.o. after midnight.  No visible coronary artery calcification noted on CT scan of the chest.  Reviewed with Dr. Delton See over the phone who will [facility] the [facility].   Severity  of Illness: The appropriate patient status for this patient is INPATIENT. Inpatient status is judged to be reasonable and necessary in order to  provide the required intensity of service to ensure the patient's safety. The patient's presenting symptoms, physical exam findings, and initial radiographic and laboratory data in the context of their chronic comorbidities is felt to place them at high risk for further clinical deterioration. Furthermore, it is not anticipated that the patient will be medically stable for discharge from the hospital within 2 midnights of admission. The following factors support the patient status of inpatient.   " The patient's presenting symptoms include  SOB . " The worrisome physical exam findings include + JVD " The initial radiographic and laboratory data are worrisome because of Elevated troponin, BNP " The chronic co-morbidities include None   * I certify that at the point of admission it is my clinical judgment that the patient will require inpatient hospital care spanning beyond 2 midnights from the point of admission due to high intensity of service, high risk for further deterioration and high frequency of surveillance required.*    For questions or updates, please contact [facility] Please consult [email or web address] for contact info under Cardiology/STEMI.   Vonzella Nipple [place], [place]  12/11/2017 4:19 PM    The patient was seen, examined and discussed with Bhagat,Bhavinkumar PA-C and I agree with the above.   67 y.o. female with no significant past medical history (impaired glucose tolerance), prior smoker, presented for evaluation of acute onset shortness of breath. This was a sudden onset this am, no prior chest pain, DOE, no orthopnea, PND, no LE edema. No FH of CAD or CHF. Admission labs - BNP 900, troponin 0.22-->0.18, ECG shows SR, negative T waves in the lateral leads, frequent unifocal PVCs. She was started on heparin drip and iv lasix, I would  add aspirin and atorvstatin as well as carvedilol if her BP tolerates it.  Tobias Alexander, MD 12/12/2017

## 2017-12-11 NOTE — ED Notes (Signed)
Pt's daughter, Jannifer Hick, cell: [phone]

## 2017-12-11 NOTE — ED Triage Notes (Signed)
Patient presented to with c/o SOB. Patient denies any chest pain. Patient went to primary doctor this morning and was sent to ed due to fast heart rate.

## 2017-12-12 ENCOUNTER — Inpatient Hospital Stay (HOSPITAL_COMMUNITY): Payer: 59

## 2017-12-12 ENCOUNTER — Inpatient Hospital Stay (HOSPITAL_COMMUNITY)
Admission: EM | Disposition: A | Discharge status: Home / Self Care | Payer: Self-pay | Source: Home / Self Care | Attending: Cardiology

## 2017-12-12 DIAGNOSIS — R778 Other specified abnormalities of plasma proteins: Secondary | ICD-10-CM

## 2017-12-12 DIAGNOSIS — I340 Nonrheumatic mitral (valve) insufficiency: Secondary | ICD-10-CM

## 2017-12-12 DIAGNOSIS — I5041 Acute combined systolic (congestive) and diastolic (congestive) heart failure: Secondary | ICD-10-CM

## 2017-12-12 DIAGNOSIS — I5023 Acute on chronic systolic (congestive) heart failure: Secondary | ICD-10-CM

## 2017-12-12 DIAGNOSIS — R7989 Other specified abnormal findings of blood chemistry: Secondary | ICD-10-CM

## 2017-12-12 HISTORY — PX: LEFT HEART CATH AND CORONARY ANGIOGRAPHY: CATH118249

## 2017-12-12 LAB — TROPONIN I
Troponin I: 0.18 ng/mL (ref <0.03)
Troponin I: 0.2 ng/mL (ref <0.03)

## 2017-12-12 LAB — LIPID PANEL
Cholesterol: 190 mg/dL (ref 0–200)
HDL: 81 mg/dL (ref >40)
LDL Cholesterol: 100 mg/dL — ABNORMAL HIGH (ref 0–99)
Total CHOL/HDL Ratio: 2.3 RATIO
Triglycerides: 45 mg/dL (ref <150)
VLDL: 9 mg/dL (ref 0–40)

## 2017-12-12 LAB — BASIC METABOLIC PANEL
Anion gap: 11 (ref 5–15)
BUN: 12 mg/dL (ref 6–20)
CO2: 28 mmol/L (ref 22–32)
Calcium: 9.2 mg/dL (ref 8.9–10.3)
Chloride: 102 mmol/L (ref 101–111)
Creatinine, Ser: 0.95 mg/dL (ref 0.44–1.00)
GFR calc Af Amer: >60 mL/min (ref >60)
GFR calc non Af Amer: >60 mL/min (ref >60)
Glucose, Bld: 101 mg/dL — ABNORMAL HIGH (ref 65–99)
Potassium: 3.4 mmol/L — ABNORMAL LOW (ref 3.5–5.1)
Sodium: 141 mmol/L (ref 135–145)

## 2017-12-12 LAB — CBC
HCT: 42.2 % (ref 36.0–46.0)
Hemoglobin: 13.8 g/dL (ref 12.0–15.0)
MCH: 28.2 pg (ref 26.0–34.0)
MCHC: 32.7 g/dL (ref 30.0–36.0)
MCV: 86.1 fL (ref 78.0–100.0)
Platelets: 148 10*3/uL — ABNORMAL LOW (ref 150–400)
RBC: 4.9 MIL/uL (ref 3.87–5.11)
RDW: 13 % (ref 11.5–15.5)
WBC: 3.7 10*3/uL — ABNORMAL LOW (ref 4.0–10.5)

## 2017-12-12 LAB — ECHOCARDIOGRAM COMPLETE
Height: 57 in
Weight: 2304 oz

## 2017-12-12 LAB — HEPARIN LEVEL (UNFRACTIONATED)
Heparin Unfractionated: 0.31 IU/mL (ref 0.30–0.70)
Heparin Unfractionated: 0.31 IU/mL (ref 0.30–0.70)

## 2017-12-12 LAB — HIV ANTIBODY (ROUTINE TESTING W REFLEX): HIV Screen 4th Generation wRfx: NONREACTIVE

## 2017-12-12 SURGERY — LEFT HEART CATH AND CORONARY ANGIOGRAPHY
Anesthesia: LOCAL

## 2017-12-12 MED ORDER — VERAPAMIL HCL 2.5 MG/ML IV SOLN
INTRAVENOUS | Status: AC
  Filled 2017-12-12: qty 2

## 2017-12-12 MED ORDER — IOHEXOL 350 MG/ML SOLN
INTRAVENOUS | Status: DC | PRN
  Administered 2017-12-12: 40 mL via INTRA_ARTERIAL

## 2017-12-12 MED ORDER — SODIUM CHLORIDE 0.9 % IV SOLN: 250.0000 mL | INTRAVENOUS | Status: DC | PRN

## 2017-12-12 MED ORDER — HEPARIN SODIUM (PORCINE) 1000 UNIT/ML IJ SOLN
INTRAMUSCULAR | Status: DC | PRN
  Administered 2017-12-12: 4000 [IU] via INTRAVENOUS

## 2017-12-12 MED ORDER — ENOXAPARIN SODIUM 40 MG/0.4ML ~~LOC~~ SOLN
40.0000 mg | SUBCUTANEOUS | Status: DC
  Administered 2017-12-13: 40 mg via SUBCUTANEOUS
  Filled 2017-12-12: qty 0.4

## 2017-12-12 MED ORDER — HEPARIN (PORCINE) IN NACL 2-0.9 UNITS/ML
INTRAMUSCULAR | Status: AC | PRN
  Administered 2017-12-12 (×2): 500 mL via INTRA_ARTERIAL

## 2017-12-12 MED ORDER — SODIUM CHLORIDE 0.9 % IV SOLN
INTRAVENOUS | Status: AC | PRN
  Administered 2017-12-12: 10 mL/h via INTRAVENOUS

## 2017-12-12 MED ORDER — LIDOCAINE HCL (PF) 1 % IJ SOLN
INTRAMUSCULAR | Status: AC
  Filled 2017-12-12: qty 30

## 2017-12-12 MED ORDER — MIDAZOLAM HCL 2 MG/2ML IJ SOLN
INTRAMUSCULAR | Status: DC | PRN
  Administered 2017-12-12: 1 mg via INTRAVENOUS

## 2017-12-12 MED ORDER — LIDOCAINE HCL (PF) 1 % IJ SOLN
INTRAMUSCULAR | Status: DC | PRN
  Administered 2017-12-12: 2 mL

## 2017-12-12 MED ORDER — SODIUM CHLORIDE 0.9 % IV SOLN
INTRAVENOUS | Status: AC
  Administered 2017-12-12: 18:00:00 via INTRAVENOUS

## 2017-12-12 MED ORDER — MIDAZOLAM HCL 2 MG/2ML IJ SOLN
INTRAMUSCULAR | Status: AC
  Filled 2017-12-12: qty 2

## 2017-12-12 MED ORDER — SODIUM CHLORIDE 0.9% FLUSH: 3.0000 mL | INTRAVENOUS | Status: DC | PRN

## 2017-12-12 MED ORDER — HEPARIN (PORCINE) IN NACL 1000-0.9 UT/500ML-% IV SOLN
INTRAVENOUS | Status: AC
  Filled 2017-12-12: qty 1000

## 2017-12-12 MED ORDER — VERAPAMIL HCL 2.5 MG/ML IV SOLN
INTRAVENOUS | Status: DC | PRN
  Administered 2017-12-12: 16:00:00 via INTRA_ARTERIAL

## 2017-12-12 MED ORDER — HEPARIN SODIUM (PORCINE) 1000 UNIT/ML IJ SOLN
INTRAMUSCULAR | Status: AC
  Filled 2017-12-12: qty 1

## 2017-12-12 MED ORDER — FENTANYL CITRATE (PF) 100 MCG/2ML IJ SOLN
INTRAMUSCULAR | Status: AC
  Filled 2017-12-12: qty 2

## 2017-12-12 MED ORDER — SODIUM CHLORIDE 0.9% FLUSH
3.0000 mL | Freq: Two times a day (BID) | INTRAVENOUS | Status: DC
  Administered 2017-12-12 – 2017-12-16 (×5): 3 mL via INTRAVENOUS

## 2017-12-12 MED ORDER — ONDANSETRON HCL 4 MG/2ML IJ SOLN: 4.0000 mg | Freq: Four times a day (QID) | INTRAMUSCULAR | Status: DC | PRN

## 2017-12-12 MED ORDER — PERFLUTREN LIPID MICROSPHERE
INTRAVENOUS | Status: AC
  Administered 2017-12-12: 6 mL via INTRAVENOUS
  Filled 2017-12-12: qty 10

## 2017-12-12 MED ORDER — PERFLUTREN LIPID MICROSPHERE
1.0000 mL | INTRAVENOUS | Status: AC | PRN
  Administered 2017-12-12: 6 mL via INTRAVENOUS
  Filled 2017-12-12: qty 10

## 2017-12-12 MED ORDER — ACETAMINOPHEN 325 MG PO TABS: 650.0000 mg | ORAL_TABLET | ORAL | Status: DC | PRN

## 2017-12-12 MED ORDER — ASPIRIN 81 MG PO CHEW: 81.0000 mg | CHEWABLE_TABLET | Freq: Every day | ORAL | Status: DC

## 2017-12-12 MED ORDER — FENTANYL CITRATE (PF) 100 MCG/2ML IJ SOLN
INTRAMUSCULAR | Status: DC | PRN
  Administered 2017-12-12: 25 ug via INTRAVENOUS

## 2017-12-12 MED ORDER — OXYCODONE HCL 5 MG PO TABS: 5.0000 mg | ORAL_TABLET | ORAL | Status: DC | PRN

## 2017-12-12 MED ORDER — POTASSIUM CHLORIDE CRYS ER 20 MEQ PO TBCR
40.0000 meq | EXTENDED_RELEASE_TABLET | Freq: Once | ORAL | Status: AC
  Administered 2017-12-12: 40 meq via ORAL
  Filled 2017-12-12: qty 2

## 2017-12-12 SURGICAL SUPPLY — 12 items
CATH INFINITI 5 FR JL3.5 (CATHETERS) ×1 IMPLANT
CATH INFINITI JR4 5F (CATHETERS) ×1 IMPLANT
COVER PRB 48X5XTLSCP FOLD TPE (BAG) IMPLANT
COVER PROBE 5X48 (BAG) ×2
DEVICE RAD COMP TR BAND LRG (VASCULAR PRODUCTS) ×1 IMPLANT
GLIDESHEATH SLEND A-KIT 6F 22G (SHEATH) ×1 IMPLANT
GUIDEWIRE INQWIRE 1.5J.035X260 (WIRE) IMPLANT
INQWIRE 1.5J .035X260CM (WIRE) ×2
KIT HEART LEFT (KITS) ×2 IMPLANT
PACK CARDIAC CATHETERIZATION (CUSTOM PROCEDURE TRAY) ×2 IMPLANT
TRANSDUCER W/STOPCOCK (MISCELLANEOUS) ×2 IMPLANT
TUBING CIL FLEX 10 FLL-RA (TUBING) ×2 IMPLANT

## 2017-12-12 NOTE — Progress Notes (Signed)
ANTICOAGULATION CONSULT NOTE - Follow up Consult  Pharmacy Consult for IV heparin Indication: ACS/NSTEMI  No Known Allergies  Patient Measurements: Height: 4\' 9"  (144.8 cm) Weight: 144 lb (65.3 kg) IBW/kg (Calculated) : 38.6 Heparin Dosing Weight: 58.5 kg  Vital Signs: Temp: 99.5 F (37.5 C) (05/29 0748) Temp Source: Oral (05/29 0748) BP: 116/66 (05/29 0748) Pulse Rate: 87 (05/29 0748)  Labs: Recent Labs    12/11/17 1250 12/11/17 2144 12/12/17 0246  HGB 14.0  --  13.8  HCT 43.0  --  42.2  PLT 148*  --  148*  APTT 27  --   --   LABPROT 12.6  --   --   INR 0.95  --   --   HEPARINUNFRC  --   --  0.31  CREATININE 0.87  --  0.95  TROPONINI  --  0.22* 0.18*    Estimated Creatinine Clearance: 45.3 mL/min (by C-G formula based on SCr of 0.95 mg/dL).   Medical History: Past Medical History:  Diagnosis Date  . History of blood transfusion    "when I had colon resection" (12/11/2017)  . Migraine    "stopped in the 1970s" (12/11/2017)  . Mitral valve prolapse     Assessment: 57 y/oF who presented [facility] ED on 12/11/2017 with SOB and elevated heart rate. D-dimer elevated. CTa of chest negative for PE. Troponin elevated at 0.13. Pharmacy consulted to start heparin infusion. Patient not on any anticoagulants PTA.   Confirmatory heparin level continues to be at goal. Plans for cath this afternoon.   Goal of Therapy:  Heparin level 0.3-0.7 units/ml Monitor platelets by anticoagulation protocol: Yes   Plan:   Continue heparin infusion at 700 units/hr  Daily CBC (watch Pltc) and heparin level  Monitor closely for s/sx of bleeding   Thank you for allowing [place] to participate in this patients care.  Sheppard Coil PharmD., BCPS Clinical Pharmacist 12/12/2017 10:21 AM

## 2017-12-12 NOTE — Progress Notes (Signed)
ANTICOAGULATION CONSULT NOTE - Follow up Consult  Pharmacy Consult for IV heparin Indication: ACS/NSTEMI  No Known Allergies  Patient Measurements: Height: 4\' 11"  (149.9 cm) Weight: 152 lb (68.9 kg) IBW/kg (Calculated) : 43.2 Heparin Dosing Weight: 58.5 kg  Vital Signs: Temp: 98.5 F (36.9 C) (05/29 0011) Temp Source: Oral (05/29 0011) BP: 118/81 (05/29 0011) Pulse Rate: 79 (05/29 0011)  Labs: Recent Labs    12/11/17 1250 12/11/17 2144 12/12/17 0246  HGB 14.0  --  13.8  HCT 43.0  --  42.2  PLT 148*  --  148*  APTT 27  --   --   LABPROT 12.6  --   --   INR 0.95  --   --   HEPARINUNFRC  --   --  0.31  CREATININE 0.87  --  0.95  TROPONINI  --  0.22* 0.18*    Estimated Creatinine Clearance: 49.2 mL/min (by C-G formula based on SCr of 0.95 mg/dL).   Medical History: Past Medical History:  Diagnosis Date  . History of blood transfusion    "when I had colon resection" (12/11/2017)  . Migraine    "stopped in the 1970s" (12/11/2017)  . Mitral valve prolapse      Assessment: 65 y/oF who presented to [facility] ED on 12/11/2017 with SOB and elevated heart rate. D-dimer elevated. CTa of chest negative for PE. Troponin elevated at 0.13. Pharmacy consulted to start heparin infusion. Patient not on any anticoagulants PTA. Baseline CBC reveals Hgb/Hct WNL and Pltc slightly low at 148K.   Heparin level this am is therapeutic at 0.31, per RN no problems with infusion or bleeding  Goal of Therapy:  Heparin level 0.3-0.7 units/ml Monitor platelets by anticoagulation protocol: Yes   Plan:   Continue heparin infusion at 700 units/hr  Confirmatory Heparin level 6-8 hours  Daily CBC (watch Pltc) and heparin level  Monitor closely for s/sx of bleeding   Thank you for allowing [place] to participate in this patients care. Signe Colt, PharmD

## 2017-12-12 NOTE — Interval H&P Note (Signed)
Cath Lab Visit (complete for each Cath Lab visit)  Clinical Evaluation Leading to the Procedure:   ACS: Yes.    Non-ACS:    Anginal Classification: CCS III  Anti-ischemic medical therapy: Minimal Therapy (1 class of medications)  Non-Invasive Test Results: No non-invasive testing performed  Prior CABG: No previous CABG      History and Physical Interval Note:  12/12/2017 3:29 PM  Savannah Bell  has presented today for surgery, with the diagnosis of cp  The various methods of treatment have been discussed with the patient and family. After consideration of risks, benefits and other options for treatment, the patient has consented to  Procedure(s): LEFT HEART CATH AND CORONARY ANGIOGRAPHY (N/A) as a surgical intervention .  The patient's history has been reviewed, patient examined, no change in status, stable for surgery.  I have reviewed the patient's chart and labs.  Questions were answered to the patient's satisfaction.     Lyn Records III

## 2017-12-12 NOTE — Progress Notes (Signed)
  Echocardiogram 2D Echocardiogram with definity has been performed.  Leta Jungling M 12/12/2017, 12:04 PM

## 2017-12-12 NOTE — H&P (View-Only) (Signed)
Progress Note  Patient Name: Savannah Bell Date of Encounter: 12/12/2017  Primary Cardiologist: Tobias Alexander, MD   Subjective   No chest pain and SOB is improved.  Has been able to sleep without SOB  Inpatient Medications    Scheduled Meds: . aspirin EC  81 mg Oral Daily  . atorvastatin  40 mg Oral q1800  . potassium chloride  40 mEq Oral Once   Continuous Infusions: . heparin 700 Units/hr (12/11/17 1600)   PRN Meds: acetaminophen, nitroGLYCERIN, ondansetron (ZOFRAN) IV   Vital Signs    Vitals:   12/11/17 2154 12/12/17 0011 12/12/17 0519 12/12/17 0748  BP: 106/75 118/81 (!) 112/58 116/66  Pulse: 85 79 80 87  Resp: 18 18 18 20   Temp: 98.4 F (36.9 C) 98.5 F (36.9 C) 98.8 F (37.1 C) 99.5 F (37.5 C)  TempSrc: Oral Oral Oral Oral  SpO2: 95% 97% 96% 94%  Weight: 147 lb (66.7 kg)  144 lb (65.3 kg)   Height: 4\' 9"  (1.448 m)       Intake/Output Summary (Last 24 hours) at 12/12/2017 0806 Last data filed at 12/12/2017 0600 Gross per 24 hour  Intake 473 ml  Output 1825 ml  Net -1352 ml   Filed Weights   12/11/17 1129 12/11/17 2154 12/12/17 0519  Weight: 152 lb (68.9 kg) 147 lb (66.7 kg) 144 lb (65.3 kg)    Telemetry    SR with PACs one burst of ST at 120 - Personally Reviewed  ECG    SR with poor R wave progression, and stable T waves - Personally Reviewed  Physical Exam   GEN: No acute distress.   Neck: No JVD Cardiac: RRR, no murmurs, rubs, or gallops.  Respiratory: Clear to auscultation bilaterally. GI: Soft, nontender, non-distended  MS: No edema; No deformity. Neuro:  Nonfocal  Psych: Normal affect   Labs    Chemistry Recent Labs  Lab 12/11/17 1250 12/12/17 0246  NA 139 141  K 3.9 3.4*  CL 104 102  CO2 26 28  GLUCOSE 103* 101*  BUN 14 12  CREATININE 0.87 0.95  CALCIUM 9.0 9.2  PROT 7.4  --   ALBUMIN 4.2  --   AST 121*  --   ALT 123*  --   ALKPHOS 87  --   BILITOT 0.9  --   GFRNONAA >60 >60  GFRAA >60 >60  ANIONGAP 9  11     Hematology Recent Labs  Lab 12/11/17 1250 12/12/17 0246  WBC 3.3* 3.7*  RBC 4.83 4.90  HGB 14.0 13.8  HCT 43.0 42.2  MCV 89.0 86.1  MCH 29.0 28.2  MCHC 32.6 32.7  RDW 13.3 13.0  PLT 148* 148*    Cardiac Enzymes Recent Labs  Lab 12/11/17 2144 12/12/17 0246  TROPONINI 0.22* 0.18*    Recent Labs  Lab 12/11/17 1255  TROPIPOC 0.13*     BNP Recent Labs  Lab 12/11/17 1250  BNP 902.1*     DDimer  Recent Labs  Lab 12/11/17 1250  DDIMER 1.51*     Radiology    Dg Chest 2 View  Result Date: 12/11/2017 CLINICAL DATA:  Shortness of breath EXAM: CHEST - 2 VIEW COMPARISON:  09/06/2011 FINDINGS: Mild enlargement of the cardiopericardial silhouette with upper zone pulmonary vascular indistinctness and Kerley B lines indicating interstitial edema. Bibasilar subsegmental atelectasis. No significant pleural effusion. IMPRESSION: 1. Appearance compatible with mild to moderate congestive heart failure, with mild enlargement of the cardiopericardial silhouette, pulmonary venous hypertension,  and interstitial pulmonary edema. Electronically Signed   By: Gaylyn Rong M.D.   On: 12/11/2017 12:14   Ct Angio Chest Pe W And/or Wo Contrast  Result Date: 12/11/2017 CLINICAL DATA:  Acute presentation with shortness of breath and tachycardia. EXAM: CT ANGIOGRAPHY CHEST WITH CONTRAST TECHNIQUE: Multidetector CT imaging of the chest was performed using the standard protocol during bolus administration of intravenous contrast. Multiplanar CT image reconstructions and MIPs were obtained to evaluate the vascular anatomy. CONTRAST:  50mL ISOVUE-370 IOPAMIDOL (ISOVUE-370) INJECTION 76% COMPARISON:  Chest radiography same day FINDINGS: Cardiovascular: Pulmonary arterial opacification is good. There are no pulmonary emboli. There is mild aortic atherosclerosis. No sign of aneurysm. Heart size is upper limits of normal, with a dilated left ventricle. No pericardial fluid. Mediastinum/Nodes: No  mass or lymphadenopathy. Lungs/Pleura: Bilateral pleural effusions layering dependently with dependent atelectasis. There is interstitial pulmonary edema and early alveolar edema. Upper Abdomen: Negative Musculoskeletal: Ordinary spinal degenerative changes. 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 Review of the MIP images confirms the above findings. IMPRESSION: Findings consistent with congestive heart failure. Dilated left ventricle. Bilateral pleural effusions layering dependently. Interstitial and early alveolar pulmonary edema. No pulmonary emboli. Mild aortic atherosclerosis. No visible coronary artery calcification. Electronically Signed   By: Paulina Fusi M.D.   On: 12/11/2017 14:59    Cardiac Studies   Echo pending  Patient Profile     67 y.o. female with only past hx is migraine and MVP.  Now admitted with SOB-acutely.    Assessment & Plan    Acute SOB with BNP 902 and troponin pk 0.22  --neg CTA of chest for PE, but dilated LV. + interstitial pulmonary edema.  --rec'd lasix total of 60 mg yesterday. --neg 1352 since admit and wt is down 3 lbs --echo pending --? Cath today Dr. Delton See to see and decide.  ACS elevated troponin due to HF vs ischemia.  --on IV heparin. No chest pain.   Hypokalemia-replace.   HLD with LDL of 100 and HDL of 81   For questions or updates, please contact [facility] Please consult [email or web address] for contact info under Cardiology/STEMI.     Signed, Nada Boozer, NP  12/12/2017, 8:06 AM    The patient was seen, examined and discussed with Nada Boozer, NP and I agree with the above.   The patient diuresed 8 lbs since yesterday and feels significantly better, troponin 0.22 -->0.18, ECG with frequent PVCs. We will plan for a left heart catheterization today, she agrees. Echo is pending. I will add carvedilol 3.125 mg po BID to her regimen.  Tobias Alexander, MD 12/12/2017

## 2017-12-12 NOTE — Progress Notes (Addendum)
Progress Note  Patient Name: Savannah Bell Date of Encounter: 12/12/2017  Primary Cardiologist: Tobias Alexander, MD   Subjective   No chest pain and SOB is improved.  Has been able to sleep without SOB  Inpatient Medications    Scheduled Meds: . aspirin EC  81 mg Oral Daily  . atorvastatin  40 mg Oral q1800  . potassium chloride  40 mEq Oral Once   Continuous Infusions: . heparin 700 Units/hr (12/11/17 1600)   PRN Meds: acetaminophen, nitroGLYCERIN, ondansetron (ZOFRAN) IV   Vital Signs    Vitals:   12/11/17 2154 12/12/17 0011 12/12/17 0519 12/12/17 0748  BP: 106/75 118/81 (!) 112/58 116/66  Pulse: 85 79 80 87  Resp: 18 18 18 20   Temp: 98.4 F (36.9 C) 98.5 F (36.9 C) 98.8 F (37.1 C) 99.5 F (37.5 C)  TempSrc: Oral Oral Oral Oral  SpO2: 95% 97% 96% 94%  Weight: 147 lb (66.7 kg)  144 lb (65.3 kg)   Height: 4\' 9"  (1.448 m)       Intake/Output Summary (Last 24 hours) at 12/12/2017 0806 Last data filed at 12/12/2017 0600 Gross per 24 hour  Intake 473 ml  Output 1825 ml  Net -1352 ml   Filed Weights   12/11/17 1129 12/11/17 2154 12/12/17 0519  Weight: 152 lb (68.9 kg) 147 lb (66.7 kg) 144 lb (65.3 kg)    Telemetry    SR with PACs one burst of ST at 120 - Personally Reviewed  ECG    SR with poor R wave progression, and stable T waves - Personally Reviewed  Physical Exam   GEN: No acute distress.   Neck: No JVD Cardiac: RRR, no murmurs, rubs, or gallops.  Respiratory: Clear to auscultation bilaterally. GI: Soft, nontender, non-distended  MS: No edema; No deformity. Neuro:  Nonfocal  Psych: Normal affect   Labs    Chemistry Recent Labs  Lab 12/11/17 1250 12/12/17 0246  NA 139 141  K 3.9 3.4*  CL 104 102  CO2 26 28  GLUCOSE 103* 101*  BUN 14 12  CREATININE 0.87 0.95  CALCIUM 9.0 9.2  PROT 7.4  --   ALBUMIN 4.2  --   AST 121*  --   ALT 123*  --   ALKPHOS 87  --   BILITOT 0.9  --   GFRNONAA >60 >60  GFRAA >60 >60  ANIONGAP 9  11     Hematology Recent Labs  Lab 12/11/17 1250 12/12/17 0246  WBC 3.3* 3.7*  RBC 4.83 4.90  HGB 14.0 13.8  HCT 43.0 42.2  MCV 89.0 86.1  MCH 29.0 28.2  MCHC 32.6 32.7  RDW 13.3 13.0  PLT 148* 148*    Cardiac Enzymes Recent Labs  Lab 12/11/17 2144 12/12/17 0246  TROPONINI 0.22* 0.18*    Recent Labs  Lab 12/11/17 1255  TROPIPOC 0.13*     BNP Recent Labs  Lab 12/11/17 1250  BNP 902.1*     DDimer  Recent Labs  Lab 12/11/17 1250  DDIMER 1.51*     Radiology    Dg Chest 2 View  Result Date: 12/11/2017 CLINICAL DATA:  Shortness of breath EXAM: CHEST - 2 VIEW COMPARISON:  09/06/2011 FINDINGS: Mild enlargement of the cardiopericardial silhouette with upper zone pulmonary vascular indistinctness and Kerley B lines indicating interstitial edema. Bibasilar subsegmental atelectasis. No significant pleural effusion. IMPRESSION: 1. Appearance compatible with mild to moderate congestive heart failure, with mild enlargement of the cardiopericardial silhouette, pulmonary venous hypertension,  and interstitial pulmonary edema. Electronically Signed   By: Gaylyn Rong M.D.   On: 12/11/2017 12:14   Ct Angio Chest Pe W And/or Wo Contrast  Result Date: 12/11/2017 CLINICAL DATA:  Acute presentation with shortness of breath and tachycardia. EXAM: CT ANGIOGRAPHY CHEST WITH CONTRAST TECHNIQUE: Multidetector CT imaging of the chest was performed using the standard protocol during bolus administration of intravenous contrast. Multiplanar CT image reconstructions and MIPs were obtained to evaluate the vascular anatomy. CONTRAST:  50mL ISOVUE-370 IOPAMIDOL (ISOVUE-370) INJECTION 76% COMPARISON:  Chest radiography same day FINDINGS: Cardiovascular: Pulmonary arterial opacification is good. There are no pulmonary emboli. There is mild aortic atherosclerosis. No sign of aneurysm. Heart size is upper limits of normal, with a dilated left ventricle. No pericardial fluid. Mediastinum/Nodes: No  mass or lymphadenopathy. Lungs/Pleura: Bilateral pleural effusions layering dependently with dependent atelectasis. There is interstitial pulmonary edema and early alveolar edema. Upper Abdomen: Negative Musculoskeletal: Ordinary spinal degenerative changes. 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 Review of the MIP images confirms the above findings. IMPRESSION: Findings consistent with congestive heart failure. Dilated left ventricle. Bilateral pleural effusions layering dependently. Interstitial and early alveolar pulmonary edema. No pulmonary emboli. Mild aortic atherosclerosis. No visible coronary artery calcification. Electronically Signed   By: Paulina Fusi M.D.   On: 12/11/2017 14:59    Cardiac Studies   Echo pending  Patient Profile     67 y.o. female with only past hx is migraine and MVP.  Now admitted with SOB-acutely.    Assessment & Plan    Acute SOB with BNP 902 and troponin pk 0.22  --neg CTA of chest for PE, but dilated LV. + interstitial pulmonary edema.  --rec'd lasix total of 60 mg yesterday. --neg 1352 since admit and wt is down 3 lbs --echo pending --? Cath today Dr. Delton See to see and decide.  ACS elevated troponin due to HF vs ischemia.  --on IV heparin. No chest pain.   Hypokalemia-replace.   HLD with LDL of 100 and HDL of 81   For questions or updates, please contact [facility] Please consult [email or web address] for contact info under Cardiology/STEMI.     Signed, Nada Boozer, NP  12/12/2017, 8:06 AM    The patient was seen, examined and discussed with Nada Boozer, NP and I agree with the above.   The patient diuresed 8 lbs since yesterday and feels significantly better, troponin 0.22 -->0.18, ECG with frequent PVCs. We will plan for a left heart catheterization today, she agrees. Echo is pending. I will add carvedilol 3.125 mg po BID to her regimen.  Tobias Alexander, MD 12/12/2017

## 2017-12-12 NOTE — Plan of Care (Signed)
  Problem: Coping: Goal: Level of anxiety will decrease Outcome: Completed/Met

## 2017-12-12 NOTE — CV Procedure (Signed)
   Patent man-made saphenous vein Y graft to diagonal LAD and to the obtuse marginals.  Graft is widely patent.  SVG to the mid right coronary widely patent.  Severe diffuse disease proximal and distal to previously placed native vessel stent in 1994 with moderate progression since 2015.  Totally occluded native right, circumflex, and LAD.  Normal LV function with elevated EDP.  Recommend medical therapy for diastolic heart failure.

## 2017-12-12 NOTE — Plan of Care (Signed)
  Problem: Activity: Goal: Risk for activity intolerance will decrease Outcome: Progressing   Problem: Safety: Goal: Ability to remain free from injury will improve Outcome: Progressing   

## 2017-12-12 NOTE — Plan of Care (Signed)
  Problem: Activity: Goal: Risk for activity intolerance will decrease Outcome: Completed/Met   Problem: Nutrition: Goal: Adequate nutrition will be maintained Outcome: Completed/Met   Problem: Safety: Goal: Ability to remain free from injury will improve Outcome: Completed/Met   Problem: Skin Integrity: Goal: Risk for impaired skin integrity will decrease Outcome: Completed/Met

## 2017-12-13 ENCOUNTER — Encounter (HOSPITAL_COMMUNITY): Payer: Self-pay | Admitting: Interventional Cardiology

## 2017-12-13 ENCOUNTER — Inpatient Hospital Stay (HOSPITAL_COMMUNITY): Payer: 59

## 2017-12-13 DIAGNOSIS — I5021 Acute systolic (congestive) heart failure: Secondary | ICD-10-CM

## 2017-12-13 DIAGNOSIS — R748 Abnormal levels of other serum enzymes: Secondary | ICD-10-CM

## 2017-12-13 DIAGNOSIS — I5023 Acute on chronic systolic (congestive) heart failure: Secondary | ICD-10-CM

## 2017-12-13 DIAGNOSIS — I428 Other cardiomyopathies: Secondary | ICD-10-CM

## 2017-12-13 DIAGNOSIS — I493 Ventricular premature depolarization: Secondary | ICD-10-CM

## 2017-12-13 LAB — CBC
HCT: 42.1 % (ref 36.0–46.0)
Hemoglobin: 13.8 g/dL (ref 12.0–15.0)
MCH: 28.8 pg (ref 26.0–34.0)
MCHC: 32.8 g/dL (ref 30.0–36.0)
MCV: 87.9 fL (ref 78.0–100.0)
Platelets: 147 10*3/uL — ABNORMAL LOW (ref 150–400)
RBC: 4.79 MIL/uL (ref 3.87–5.11)
RDW: 13.2 % (ref 11.5–15.5)
WBC: 4.5 10*3/uL (ref 4.0–10.5)

## 2017-12-13 LAB — SEDIMENTATION RATE: Sed Rate: 3 mm/hr (ref 0–22)

## 2017-12-13 LAB — HIGH SENSITIVITY CRP: CRP, High Sensitivity: 7.78 mg/L — ABNORMAL HIGH (ref 0.00–3.00)

## 2017-12-13 MED ORDER — AMIODARONE HCL 200 MG PO TABS
400.0000 mg | ORAL_TABLET | Freq: Two times a day (BID) | ORAL | Status: DC
  Administered 2017-12-13 – 2017-12-14 (×2): 400 mg via ORAL
  Filled 2017-12-13 (×2): qty 2

## 2017-12-13 MED ORDER — FUROSEMIDE 10 MG/ML IJ SOLN
40.0000 mg | Freq: Once | INTRAMUSCULAR | Status: AC
  Administered 2017-12-13: 40 mg via INTRAVENOUS
  Filled 2017-12-13: qty 4

## 2017-12-13 MED ORDER — CARVEDILOL 3.125 MG PO TABS: 3.1250 mg | ORAL_TABLET | Freq: Two times a day (BID) | ORAL | Status: DC

## 2017-12-13 MED ORDER — SPIRONOLACTONE 12.5 MG HALF TABLET
12.5000 mg | ORAL_TABLET | Freq: Every day | ORAL | Status: DC
  Administered 2017-12-13 – 2017-12-16 (×4): 12.5 mg via ORAL
  Filled 2017-12-13 (×5): qty 1

## 2017-12-13 MED ORDER — GADOBENATE DIMEGLUMINE 529 MG/ML IV SOLN
25.0000 mL | Freq: Once | INTRAVENOUS | Status: AC
  Administered 2017-12-13: 23 mL via INTRAVENOUS

## 2017-12-13 MED FILL — Heparin Sod (Porcine)-NaCl IV Soln 1000 Unit/500ML-0.9%: INTRAVENOUS | Qty: 1000 | Status: AC

## 2017-12-13 NOTE — Progress Notes (Addendum)
Progress Note  Patient Name: Savannah Bell Date of Encounter: 12/13/2017  Primary Cardiologist: Tobias Alexander, MD   Subjective   Pt has just returned from MRI. She is breathing much better than on admission, back to normal. No orthopnea, edema or chest discomfort.   Inpatient Medications    Scheduled Meds: . aspirin EC  81 mg Oral Daily  . atorvastatin  40 mg Oral q1800  . enoxaparin (LOVENOX) injection  40 mg Subcutaneous Q24H  . sodium chloride flush  3 mL Intravenous Q12H   Continuous Infusions: . sodium chloride     PRN Meds: sodium chloride, acetaminophen, acetaminophen, nitroGLYCERIN, ondansetron (ZOFRAN) IV, ondansetron (ZOFRAN) IV, oxyCODONE, sodium chloride flush   Vital Signs    Vitals:   12/12/17 2038 12/12/17 2207 12/13/17 0042 12/13/17 0536  BP: (!) 104/57 (!) 88/60 (!) 100/49 (!) 109/58  Pulse: 96 98 89 80  Resp:   18 18  Temp:   98.1 F (36.7 C) 98 F (36.7 C)  TempSrc:   Oral Oral  SpO2:   93% 98%  Weight:    144 lb 14.4 oz (65.7 kg)  Height:        Intake/Output Summary (Last 24 hours) at 12/13/2017 0926 Last data filed at 12/13/2017 0538 Gross per 24 hour  Intake 600.25 ml  Output 1075 ml  Net -474.75 ml   Filed Weights   12/11/17 2154 12/12/17 0519 12/13/17 0536  Weight: 147 lb (66.7 kg) 144 lb (65.3 kg) 144 lb 14.4 oz (65.7 kg)    Telemetry    Sinus rhythm with PVCs 80's-90's - Personally Reviewed  ECG    Sinus rhythm with frequent and consecutive Premature ventricular complexes and Fusion complexes, 85 bpm, Prolonged QT, QTC 528 - Personally Reviewed  Physical Exam   GEN: No acute distress.   Neck: No JVD Cardiac: Irregularly irregualar, no murmurs, rubs, or gallops.  Respiratory: Clear to auscultation bilaterally. GI: Soft, nontender, non-distended  MS: No edema; No deformity. Neuro:  Nonfocal  Psych: Normal affect   Labs    Chemistry Recent Labs  Lab 12/11/17 1250 12/12/17 0246  NA 139 141  K 3.9 3.4*  CL  104 102  CO2 26 28  GLUCOSE 103* 101*  BUN 14 12  CREATININE 0.87 0.95  CALCIUM 9.0 9.2  PROT 7.4  --   ALBUMIN 4.2  --   AST 121*  --   ALT 123*  --   ALKPHOS 87  --   BILITOT 0.9  --   GFRNONAA >60 >60  GFRAA >60 >60  ANIONGAP 9 11     Hematology Recent Labs  Lab 12/11/17 1250 12/12/17 0246 12/13/17 0452  WBC 3.3* 3.7* 4.5  RBC 4.83 4.90 4.79  HGB 14.0 13.8 13.8  HCT 43.0 42.2 42.1  MCV 89.0 86.1 87.9  MCH 29.0 28.2 28.8  MCHC 32.6 32.7 32.8  RDW 13.3 13.0 13.2  PLT 148* 148* 147*    Cardiac Enzymes Recent Labs  Lab 12/11/17 2144 12/12/17 0246 12/12/17 1014  TROPONINI 0.22* 0.18* 0.20*    Recent Labs  Lab 12/11/17 1255  TROPIPOC 0.13*     BNP Recent Labs  Lab 12/11/17 1250  BNP 902.1*     DDimer  Recent Labs  Lab 12/11/17 1250  DDIMER 1.51*     Radiology    Dg Chest 2 View  Result Date: 12/11/2017 CLINICAL DATA:  Shortness of breath EXAM: CHEST - 2 VIEW COMPARISON:  09/06/2011 FINDINGS: Mild enlargement of the  cardiopericardial silhouette with upper zone pulmonary vascular indistinctness and Kerley B lines indicating interstitial edema. Bibasilar subsegmental atelectasis. No significant pleural effusion. IMPRESSION: 1. Appearance compatible with mild to moderate congestive heart failure, with mild enlargement of the cardiopericardial silhouette, pulmonary venous hypertension, and interstitial pulmonary edema. Electronically Signed   By: Gaylyn Rong M.D.   On: 12/11/2017 12:14   Ct Angio Chest Pe W And/or Wo Contrast  Result Date: 12/11/2017 CLINICAL DATA:  Acute presentation with shortness of breath and tachycardia. EXAM: CT ANGIOGRAPHY CHEST WITH CONTRAST TECHNIQUE: Multidetector CT imaging of the chest was performed using the standard protocol during bolus administration of intravenous contrast. Multiplanar CT image reconstructions and MIPs were obtained to evaluate the vascular anatomy. CONTRAST:  80mL ISOVUE-370 IOPAMIDOL (ISOVUE-370)  INJECTION 76% COMPARISON:  Chest radiography same day FINDINGS: Cardiovascular: Pulmonary arterial opacification is good. There are no pulmonary emboli. There is mild aortic atherosclerosis. No sign of aneurysm. Heart size is upper limits of normal, with a dilated left ventricle. No pericardial fluid. Mediastinum/Nodes: No mass or lymphadenopathy. Lungs/Pleura: Bilateral pleural effusions layering dependently with dependent atelectasis. There is interstitial pulmonary edema and early alveolar edema. Upper Abdomen: Negative Musculoskeletal: Ordinary spinal degenerative changes. 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 Review of the MIP images confirms the above findings. IMPRESSION: Findings consistent with congestive heart failure. Dilated left ventricle. Bilateral pleural effusions layering dependently. Interstitial and early alveolar pulmonary edema. No pulmonary emboli. Mild aortic atherosclerosis. No visible coronary artery calcification. Electronically Signed   By: Paulina Fusi M.D.   On: 12/11/2017 14:59    Cardiac Studies   LEFT HEART CATH AND CORONARY ANGIOGRAPHY 12/12/17  Conclusion    Global left ventricular systolic dysfunction.  EF 10 to 20% by echocardiogram.  Normal left ventricular filling pressures.  Normal coronary arteries, right dominant system.  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  RECOMMENDATIONS:  With normal coronary arteries and left ventricular systolic dysfunction in the setting of normal LV cavity size, myocarditis is a consideration.  We will order a CRP and sed rate.  Consider cardiac MRI   Echocardiogram 12/12/17 Study Conclusions - Left ventricle: The cavity size was normal. Wall thickness was   normal. The estimated ejection fraction was 15%. Diffuse   hypokinesis. No mural thrombus with Definity contrast. Doppler   parameters are consistent with abnormal left ventricular   relaxation (grade 1  diastolic dysfunction). LV filling pressure   is elevated. - Mitral valve: Mildly thickened leaflets . There was mild   regurgitation. - Left atrium: The atrium was normal in size. - Tricuspid valve: There was trivial regurgitation. - Pulmonary arteries: PA peak pressure: 31 mm Hg (S). - Inferior vena cava: The vessel was normal in size. The   respirophasic diameter changes were in the normal range (>= 50%),   consistent with normal central venous pressure. - Pericardium, extracardiac: A trivial pericardial effusion was   identified. Features were not consistent with tamponade   physiology.  Impressions: - LVEF 15%, severe global hypokinesis, normal LV cavity size and   wall thickness, grade 1 DD and elevated LV filling pressure, mild   MR, normal LA size, trivial TR, RVSP 31 mmHg, normal IVC, trivial   pericardial effusion without tamponade, no mural thrombus with   Definity contrast.   Patient Profile     67 y.o. female only past hx is migraine and MVP.  Now admitted with SOB-acutely.   Assessment & Plan    Shortness  of breath -Acute onset -BNP 902 on presentation with troponin peak of 0.22 -CTA negative for PE, but dilated LV and interstitial edema noted.  -Echocardiogram showed severely decreased LVEF of 15%  -Left heart cath done yesterday showed normal coronary arteries so ischemia is not the cause of her LV dysfunction -Breathing is much better, pt reports back to normal  Non-ischemic cardiomyopathy -Echocardiogram showed severely decreased LVEF of 15% with severe global hypokinesis, normal LV cavity size and wall thickness. -Left heart cath done yesterday. Per Dr. Katrinka Blazing With normal coronary arteries and left ventricular systolic dysfunction in the setting of normal LV cavity size, myocarditis is a consideration.  -CRP elevated at 7.87. Sed rate normal at 3 -Cardiac MRI in process -Would like to start carvedilol 3.125 mg bid however, BP is soft, 88/60 last night,  109/58 this am.  -Was initially given lasix 20 mg then 40 mg with 2L UOP. She is now net negative 2L fluid balance. Not given lasix surrounding cath. Wt is down 8 lbs since admission (unchanged from yesterday).   For questions or updates, please contact [facility] Please consult [email or web address] for contact info under Cardiology/STEMI.     Signed, Berton Bon, NP  12/13/2017, 9:26 AM    The patient was seen, examined and discussed with Berton Bon, NP-C and I agree with the above.   S/P cath with normal coronaries. MRI shows Severely dilated left ventricle with normal wall thickness and severely depressed systolic function (LVEF = 17%). There is severe global hypokinesis with akinesis of the inferior, anteroseptal and anteroseptal walls. There is no late gadolinium enhancement in the left ventricular Myocardium. findings are consistent with non-ischemic cardiomyopathy. There is no evidence for inflammatory or infiltrative cardiomyopathy. No signs of myocarditis.  She has diuresed 0.5 L, negative 3 lbs, feeling better, I would give her another lasix 40 mg today. Telemetry shows SR with VR in 90', PVCs significantly less frequent than on admission.   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 However only grade 1 DD and left atrium only mildly dilated, normal RVEF.  I will start carvedilol 3.125 mg po BID, add low dose lisinopril and spironolactone if tolerated by BP, I would be hesitant to start Entresto with low BP. I would appreciate CHF follow up.  Tobias Alexander, MD 12/13/2017

## 2017-12-13 NOTE — Consult Note (Addendum)
Advanced Heart Failure Team Consult Note   Primary Physician: [name], NP (Inactive) Primary Cardiologist:  Tobias Alexander, MD  Reason for Consultation: Acute systolic HF  HPI:    Savannah Bell is seen today for evaluation of Acute systolic HF at the request of Dr Delton See.  Savannah Bell is a 67 y.o. female with no significant medical history.   She presented to [facility] on 5/28 with acute onset of SOB. SOB woke her up and lasted about 2 hours. She had no associated symptoms. She saw her PCP and was sent to ER for further evaluation. EKG form PCP office showed sinus tach 101 with LBBB and Twave inversion in the lateral leads.  D-dimer was elevated on admission, so CTA completed, which was negative for PE but showed dilated LV and interstitial pulmonary edema. BNP elevated to 902. She was started on IV lasix. Troponins were elevated 0.22 > 0.18 > 0.20 so she was started on a heparin drip.  Echo on 5/29 showed EF 15% with grade 1 DD, no LV thrombus, mild MR, trivial TR, PA peak pressure 31 mmHg, and trivial pericardial effusion.   She underwent R/LHC on 5/29 that showed normal coronary arteries, EF 10-20%, normal LV filling pressures. There was concern for myocarditis, so CRP, sed rate, and cardiac MRI were ordered. CRP elevated. Sed rate was normal.  Cardiac MRI showed EF 17%, findings consistent with NICM with no evidence of LGE to suggest inflammatory or infiltrative cardiomyopathy.   She has diuresed 8 lbs with IV lasix. No longer on any diuretics. Creatinine 0.95 yesterday. BMET ordered for today.    Feeling better today. Denies SOB, CP, or orthopnea. No fevers or chills. No recent URI. No snoring.  Able to walk the halls with no problems. Appetite is good.   SH: Lives alove. Works full time at [employer]. She drives herself. Not previously on medication, but has health insurance. No alcohol, tobacco, or drugs. No family history of CAD or CHF.  Echo 12/12/17 - Left  ventricle: The cavity size was normal. Wall thickness was   normal. The estimated ejection fraction was 15%. Diffuse   hypokinesis. No mural thrombus with Definity contrast. Doppler   parameters are consistent with abnormal left ventricular   relaxation (grade 1 diastolic dysfunction). LV filling pressure   is elevated. - Mitral valve: Mildly thickened leaflets . There was mild   regurgitation. - Left atrium: The atrium was normal in size. - Tricuspid valve: There was trivial regurgitation. - Pulmonary arteries: PA peak pressure: 31 mm Hg (S). - Inferior vena cava: The vessel was normal in size. The   respirophasic diameter changes were in the normal range (>= 50%),   consistent with normal central venous pressure. - Pericardium, extracardiac: A trivial pericardial effusion was   identified. Features were not consistent with tamponade   physiology.  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 AO 105/64 (83) LVEDP 13 No recordings for RA, PA pressures or CO/CI  Cardiac MRI 12/13/17: 1. Severely dilated left ventricle with normal wall thickness and severely depressed systolic function (LVEF = 17%). There is severe global hypokinesis with akinesis of the inferior, anteroseptal and anteroseptal walls. There is no late gadolinium enhancement in the left ventricular myocardium. 2. Normal right ventricular size, thickness and systolic function (LVEF = 53%). There are no regional wall motion abnormalities. 3.  Mildly dilated left and right atrium.  4.  Mild mitral and tricuspid regurgitation. 5.  Normal pericardium.  Mild circumferential pericardial effusion.  Review of Systems: [y] = yes, [ ]  = no   . General: Weight gain [ ] ; Weight loss [ ] ; Anorexia [ ] ; Fatigue [ ] ; Fever [ ] ; Chills [ ] ; Weakness [ ]   . Cardiac: Chest pain/pressure [ ] ; Resting SOB [ y]; Exertional  SOB [place] ]; Orthopnea [ ] ; Pedal Edema [ ] ; Palpitations [ ] ; Syncope [ ] ; Presyncope [ ] ; Paroxysmal nocturnal dyspnea[ ]   . Pulmonary: Cough [ ] ; Wheezing[ ] ; Hemoptysis[ ] ; Sputum [ ] ; Snoring [ ]   . GI: Vomiting[ ] ; Dysphagia[ ] ; Melena[ ] ; Hematochezia [ ] ; Heartburn[ ] ; Abdominal pain [ ] ; Constipation [ ] ; Diarrhea [ ] ; BRBPR [ ]   . GU: Hematuria[ ] ; Dysuria [ ] ; Nocturia[ ]   . Vascular: Pain in legs with walking [ ] ; Pain in feet with lying flat [ ] ; Non-healing sores [ ] ; Stroke [ ] ; TIA [ ] ; Slurred speech [ ] ;  . Neuro: Headaches[ ] ; Vertigo[ ] ; Seizures[ ] ; Paresthesias[ ] ;Blurred vision [ ] ; Diplopia [ ] ; Vision changes [ ]   . Ortho/Skin: Arthritis [ y]; Joint pain [ ] ; Muscle pain [ ] ; Joint swelling [ ] ; Back Pain [ ] ; Rash [ ]   . Psych: Depression[ ] ; Anxiety[ ]   . Heme: Bleeding problems [ ] ; Clotting disorders [ ] ; Anemia [ ]   . Endocrine: Diabetes [ ] ; Thyroid dysfunction[ ]   Home Medications Prior to Admission medications   Medication Sig Start Date End Date Taking? Authorizing Provider  aspirin 325 MG tablet Take 325 mg by mouth daily.   Yes [provider]  calcium gluconate 500 MG tablet Take 500 mg by mouth daily.   Yes [provider]  cholecalciferol (VITAMIN D) 1000 UNITS tablet Take 1,000 Units by mouth daily.   Yes [provider]  loratadine (CLARITIN) 10 MG tablet Take 10 mg by mouth daily as needed for allergies.   Yes [provider]    Past Medical History: Past Medical History:  Diagnosis Date  . History of blood transfusion    "when I had colon resection" (12/11/2017)  . Migraine    "stopped in the 1970s" (12/11/2017)  . Mitral valve prolapse     Past Surgical History: Past Surgical History:  Procedure Laterality Date  . ABDOMINAL HYSTERECTOMY  1998  . APPENDECTOMY  2003  . APPLICATION OF WOUND VAC  09/05/2011   Procedure: APPLICATION OF WOUND VAC;  Surgeon: Emelia Loron, MD;  Location: [facility];  Service:  General;  Laterality: N/A;  . BOWEL RESECTION  09/05/2011   Procedure: SMALL BOWEL RESECTION;  Surgeon: Emelia Loron, MD;  Location: [facility];  Service: General;  Laterality: N/A;  . BUNIONECTOMY WITH HAMMERTOE RECONSTRUCTION Bilateral    "& shortened toes"  . CESAREAN SECTION  1972  . COLON SURGERY    . LAPAROTOMY  09/05/2011   Procedure: EXPLORATORY LAPAROTOMY;  Surgeon: Emelia Loron, MD;  Location: [facility];  Service: General;  Laterality: N/A;  . LEFT HEART CATH AND CORONARY ANGIOGRAPHY N/A 12/12/2017   Procedure: LEFT HEART CATH AND CORONARY ANGIOGRAPHY;  Surgeon: Lyn Records, MD;  Location: [facility];  Service: Cardiovascular;  Laterality: N/A;  . TUBAL LIGATION  1974    Family History:  Family History  Problem Relation Age of Onset  . Sudden Cardiac Death Mother     Social History: Social History   Socioeconomic History  . Marital status:  Divorced    Spouse name: Not on file  . Number of children: Not on file  . Years of education: Not on file  . Highest education level: Not on file  Occupational History  . Not on file  Social Needs  . Financial resource strain: Not on file  . Food insecurity:    Worry: Not on file    Inability: Not on file  . Transportation needs:    Medical: Not on file    Non-medical: Not on file  Tobacco Use  . Smoking status: Former Smoker    Packs/day: 0.30    Years: 10.00    Pack years: 3.00    Types: Cigarettes    Last attempt to quit: 1982    Years since quitting: 37.4  . Smokeless tobacco: Former Neurosurgeon    Types: Snuff, Chew    Quit date: 2000  Substance and Sexual Activity  . Alcohol use: Not Currently  . Drug use: Never  . Sexual activity: Not Currently  Lifestyle  . Physical activity:    Days per week: Not on file    Minutes per session: Not on file  . Stress: Not on file  Relationships  . Social connections:    Talks on phone: Not on file    Gets together: Not on file    Attends religious service: Not on  file    Active member of club or organization: Not on file    Attends meetings of clubs or organizations: Not on file    Relationship status: Not on file  Other Topics Concern  . Not on file  Social History Narrative  . Not on file    Allergies:  No Known Allergies  Objective:    Vital Signs:   Temp:  [97.9 F (36.6 C)-99 F (37.2 C)] 98 F (36.7 C) (05/30 0536) Pulse Rate:  [0-103] 80 (05/30 0536) Resp:  [0-75] 18 (05/30 0536) BP: (88-130)/(49-82) 109/58 (05/30 0536) SpO2:  [0 %-100 %] 98 % (05/30 0536) Weight:  [144 lb 14.4 oz (65.7 kg)] 144 lb 14.4 oz (65.7 kg) (05/30 0536) Last BM Date: 12/10/17  Weight change: Filed Weights   12/11/17 2154 12/12/17 0519 12/13/17 0536  Weight: 147 lb (66.7 kg) 144 lb (65.3 kg) 144 lb 14.4 oz (65.7 kg)    Intake/Output:   Intake/Output Summary (Last 24 hours) at 12/13/2017 1138 Last data filed at 12/13/2017 0538 Gross per 24 hour  Intake 600.25 ml  Output 1075 ml  Net -474.75 ml      Physical Exam    General:  Well appearing. No resp difficulty HEENT: normal anicteric  Neck: supple. JVP 6-7. Carotids 2+ bilat; no bruits. No lymphadenopathy or thyromegaly appreciated. Cor: PMI nondisplaced. Regular rate & rhythm with frequent PVCs 2/6 systolic murmur at LUSB Lungs: clear no wheez Abdomen: soft, nontender, nondistended. No hepatosplenomegaly. No bruits or masses. Good bowel sounds. Extremities: no cyanosis, clubbing, rash, edema Neuro: alert & oriented x 3, cranial nerves grossly intact. moves all 4 extremities w/o difficulty. Affect pleasant  Telemetry   SR 80-90s with frequent PVCs (12-22/min) - multiple morphologies with one predominant foci . QRS ~100 ms. Personally reviewed.   EKG    12/12/17: SR 85 with frequent PVCs. Personally reviewed.   Labs   Basic Metabolic Panel: Recent Labs  Lab 12/11/17 1250 12/12/17 0246  NA 139 141  K 3.9 3.4*  CL 104 102  CO2 26 28  GLUCOSE 103* 101*  BUN 14 12  CREATININE  0.87 0.95  CALCIUM 9.0 9.2    Liver Function Tests: Recent Labs  Lab 12/11/17 1250  AST 121*  ALT 123*  ALKPHOS 87  BILITOT 0.9  PROT 7.4  ALBUMIN 4.2   No results for input(s): LIPASE, AMYLASE in the last 168 hours. No results for input(s): AMMONIA in the last 168 hours.  CBC: Recent Labs  Lab 12/11/17 1250 12/12/17 0246 12/13/17 0452  WBC 3.3* 3.7* 4.5  NEUTROABS 2.1  --   --   HGB 14.0 13.8 13.8  HCT 43.0 42.2 42.1  MCV 89.0 86.1 87.9  PLT 148* 148* 147*    Cardiac Enzymes: Recent Labs  Lab 12/11/17 2144 12/12/17 0246 12/12/17 1014  TROPONINI 0.22* 0.18* 0.20*    BNP: BNP (last 3 results) Recent Labs    12/11/17 1250  BNP 902.1*    ProBNP (last 3 results) No results for input(s): PROBNP in the last 8760 hours.   CBG: No results for input(s): GLUCAP in the last 168 hours.  Coagulation Studies: Recent Labs    12/11/17 1250  LABPROT 12.6  INR 0.95     Imaging   Mr Cardiac Morphology W Wo Contrast  Result Date: 12/13/2017 CLINICAL DATA:  67 year old female with new diagnosis of non-ischemic cardiomyopathy. EXAM: CARDIAC MRI TECHNIQUE: The patient was scanned on a 1.5 Tesla GE magnet. A dedicated cardiac coil was used. Functional imaging was done using Fiesta sequences. 2,3, and 4 chamber views were done to assess for RWMA's. Modified Simpson's rule using a short axis stack was used to calculate an ejection fraction on a dedicated work [employer]. The patient received 23 cc of Multihance. After 10 minutes inversion recovery sequences were used to assess for infiltration and scar tissue. CONTRAST:  23 cc  of Multihance FINDINGS: 1. Severely dilated left ventricle with normal wall thickness and severely depressed systolic function (LVEF = 17%). There is severe global hypokinesis with akinesis of the inferior, anteroseptal and anteroseptal walls. There is no late gadolinium enhancement in the left ventricular myocardium. LVEDD: 71 mm  LVESD: 68 mm LVEDV: 247 ml LVESV: 203 ml SV: 43 ml CO: 2.9 L/min Myocardial mass: 127 g 2. Normal right ventricular size, thickness and systolic function (LVEF = 53%). There are no regional wall motion abnormalities. 3.  Mildly dilated left and right atrium. 4. Normal size of the aortic root, ascending aorta and pulmonary artery. 5.  Mild mitral and tricuspid regurgitation. 6.  Normal pericardium.  Mild circumferential pericardial effusion. IMPRESSION: 1. Severely dilated left ventricle with normal wall thickness and severely depressed systolic function (LVEF = 17%). There is severe global hypokinesis with akinesis of the inferior, anteroseptal and anteroseptal walls. There is no late gadolinium enhancement in the left ventricular myocardium. 2. Normal right ventricular size, thickness and systolic function (LVEF = 53%). There are no regional wall motion abnormalities. 3.  Mildly dilated left and right atrium. 4.  Mild mitral and tricuspid regurgitation. 5.  Normal pericardium.  Mild circumferential pericardial effusion. Collectively, these findings are consistent with non-ischemic cardiomyopathy. There is no evidence for inflammatory or infiltrative cardiomyopathy. Electronically Signed   By: Tobias Alexander   On: 12/13/2017 11:23      Medications:     Current Medications: . aspirin EC  81 mg Oral Daily  . atorvastatin  40 mg Oral q1800  . enoxaparin (LOVENOX) injection  40 mg Subcutaneous Q24H  . sodium chloride flush  3 mL Intravenous Q12H     Infusions: . sodium chloride  Patient Profile   Savannah Bell is a 67 y.o. female with no medical history.   Admitted for evaluation and treatment of acute onset of SOB.   Assessment/Plan    1. 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 Normal coronaries on [facility] 12/11/17. cMRI shows no infiltrative or inflammatory CM. No family hx of HF. No ETOH. No hx of HTN.  She has 2 children in their 9s and had no SOB after pregnancies. No recent virus  that she is aware of, but she did have a nonproductive cough 2 months ago with no fever or chills. Initial EKG showed incomplete LBBB with QRS 108 ms. She has frequent PVCs (~20%), which may be causing her HF. She does not snore but thinks she goes apneic.  - Echo 5/29 EF 15% with grade 1 DD, no LV thrombus, mild MR, trivial TR, PA peak pressure 31 mmHg, and trivial pericardial effusion.  - Volume status stable on exam.  - No longer on diuretics. Down 8 lbs.  - Start spiro 12.5 mg daily - Will order overnight oximeter to rule out OSA.  2. SOB - CTA negative for PE - LHC showed normal coronaries. Can likely DC aspirin.  - Resolved with diuresis.   3. Hyperlipidemia - Continue statin.   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  - Most likely cause of her NICM - Start amiodarone 400 mg BID to suppress PVCs.   Medication concerns reviewed with patient and pharmacy team. Barriers identified: none.   Length of Stay: 2  Alford Highland, NP 12/13/17, 11:38 AM  Advanced Heart Failure Team Pager [phone] (M-F; 7a - 4p)  Please contact [facility] for night-coverage after hours (4p -7a ) and weekends on [email or web address]  Patient seen and examined with the above-signed Advanced Practice Provider and/or Housestaff. I personally reviewed laboratory data, imaging studies and relevant notes. I independently examined the patient and formulated the important aspects of the plan. I have edited the note to reflect any of my changes or salient points. I have personally discussed the plan with the patient and/or family.  Echo, cath and MRI images reviewed personally.   67 y/o woman with no significant past cardiac h/o now presents with acute systolic HF. Cath with normal cors. cMRI with Ef 17% no LGE. RV normal.  On exam volume status looks good. No s3.   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 Start amio. Will also add spiro. BP too low for ARB or ARNI currently.   [name], MD  3:46 PM

## 2017-12-14 LAB — BASIC METABOLIC PANEL
Anion gap: 13 (ref 5–15)
BUN: 11 mg/dL (ref 6–20)
CO2: 24 mmol/L (ref 22–32)
Calcium: 8.9 mg/dL (ref 8.9–10.3)
Chloride: 99 mmol/L — ABNORMAL LOW (ref 101–111)
Creatinine, Ser: 1.1 mg/dL — ABNORMAL HIGH (ref 0.44–1.00)
GFR calc Af Amer: 59 mL/min — ABNORMAL LOW (ref >60)
GFR calc non Af Amer: 51 mL/min — ABNORMAL LOW (ref >60)
Glucose, Bld: 94 mg/dL (ref 65–99)
Potassium: 3.6 mmol/L (ref 3.5–5.1)
Sodium: 136 mmol/L (ref 135–145)

## 2017-12-14 LAB — CBC
HCT: 40.9 % (ref 36.0–46.0)
Hemoglobin: 13.4 g/dL (ref 12.0–15.0)
MCH: 28.6 pg (ref 26.0–34.0)
MCHC: 32.8 g/dL (ref 30.0–36.0)
MCV: 87.4 fL (ref 78.0–100.0)
Platelets: 157 10*3/uL (ref 150–400)
RBC: 4.68 MIL/uL (ref 3.87–5.11)
RDW: 13.2 % (ref 11.5–15.5)
WBC: 3.9 10*3/uL — ABNORMAL LOW (ref 4.0–10.5)

## 2017-12-14 MED ORDER — SODIUM CHLORIDE 0.9 % IV SOLN: INTRAVENOUS | Status: DC

## 2017-12-14 MED ORDER — ASPIRIN 81 MG PO CHEW: 81.0000 mg | CHEWABLE_TABLET | Freq: Every day | ORAL | Status: DC

## 2017-12-14 MED ORDER — AMIODARONE HCL IN DEXTROSE 360-4.14 MG/200ML-% IV SOLN
30.0000 mg/h | INTRAVENOUS | Status: DC
  Administered 2017-12-15 – 2017-12-16 (×3): 30 mg/h via INTRAVENOUS
  Filled 2017-12-14 (×3): qty 200

## 2017-12-14 MED ORDER — SODIUM CHLORIDE 0.9% FLUSH: 3.0000 mL | Freq: Two times a day (BID) | INTRAVENOUS | Status: DC

## 2017-12-14 MED ORDER — HEPARIN SODIUM (PORCINE) 5000 UNIT/ML IJ SOLN: 5000.0000 [IU] | Freq: Three times a day (TID) | INTRAMUSCULAR | Status: DC

## 2017-12-14 MED ORDER — LOSARTAN POTASSIUM 25 MG PO TABS
12.5000 mg | ORAL_TABLET | Freq: Every day | ORAL | Status: DC
  Administered 2017-12-14 – 2017-12-15 (×2): 12.5 mg via ORAL
  Filled 2017-12-14 (×2): qty 1

## 2017-12-14 MED ORDER — ACETAMINOPHEN 325 MG PO TABS: 650.0000 mg | ORAL_TABLET | ORAL | Status: DC | PRN

## 2017-12-14 MED ORDER — ASPIRIN 81 MG PO CHEW: 81.0000 mg | CHEWABLE_TABLET | ORAL | Status: DC

## 2017-12-14 MED ORDER — SODIUM CHLORIDE 0.9 % IV SOLN: 250.0000 mL | INTRAVENOUS | Status: DC | PRN

## 2017-12-14 MED ORDER — AMIODARONE LOAD VIA INFUSION
150.0000 mg | Freq: Once | INTRAVENOUS | Status: AC
  Administered 2017-12-14: 150 mg via INTRAVENOUS
  Filled 2017-12-14: qty 83.34

## 2017-12-14 MED ORDER — POTASSIUM CHLORIDE CRYS ER 20 MEQ PO TBCR
20.0000 meq | EXTENDED_RELEASE_TABLET | Freq: Once | ORAL | Status: AC
  Administered 2017-12-14: 20 meq via ORAL
  Filled 2017-12-14: qty 1

## 2017-12-14 MED ORDER — SODIUM CHLORIDE 0.9% FLUSH: 3.0000 mL | INTRAVENOUS | Status: DC | PRN

## 2017-12-14 MED ORDER — AMIODARONE HCL IN DEXTROSE 360-4.14 MG/200ML-% IV SOLN
60.0000 mg/h | INTRAVENOUS | Status: AC
  Administered 2017-12-14 (×2): 60 mg/h via INTRAVENOUS
  Filled 2017-12-14 (×2): qty 200

## 2017-12-14 MED ORDER — ONDANSETRON HCL 4 MG/2ML IJ SOLN: 4.0000 mg | Freq: Four times a day (QID) | INTRAMUSCULAR | Status: DC | PRN

## 2017-12-14 NOTE — Progress Notes (Signed)
CARDIAC REHAB PHASE I   PRE:  Rate/Rhythm: 82 SR with PVCs    BP: sitting 89/72    SaO2: 99 RA  MODE:  Ambulation: 500 ft   POST:  Rate/Rhythm: 92 SR with PVCs    BP: sitting 92/71     SaO2: 100 RA  Tolerated well despite low BP and PVCs. Discussed HF management and book. Gave ex gl and CRPII. Will send referral to [place] CRPII. Pt very receptive. Sts she watches sodium already. Can walk independently. 2010-0712   Harriet Masson CES, ACSM 12/14/2017 3:15 PM

## 2017-12-14 NOTE — Progress Notes (Addendum)
Advanced Heart Failure Rounding Note  PCP-Cardiologist: Savannah Alexander, MD   Subjective:    Started on PO amiodarone for frequent PVCs yesterday.  Still having 15-20 PVCs per mintue   Diuresed 3.4 L with 40 mg IV lasix (ordered by Dr Delton See). Weight is down 1 lb. Creatinine 1.1. K 3.6  No CP, SOB, or dizziness. No orthopnea or PND. Feels a bit better.  Did not have continuous oximetry overnight.   Echo 5/29: EF 15% with grade 1 DD, no LV thrombus, mild MR, trivial TR, PA peak pressure 31 mmHg, and trivial pericardial effusion.   R/LHC 5/29: that showed normal coronary arteries, EF 10-20%, normal LV filling pressures (no right sided pressures recorded).   Cardiac MRI 12/13/17: EF 17%, findings consistent with NICM with no evidence of LGE to suggest inflammatory or infiltrative cardiomyopathy.    Objective:   Weight Range: 143 lb 9.6 oz (65.1 kg) Body mass index is 31.07 kg/m.   Vital Signs:   Temp:  [97.5 F (36.4 C)-98 F (36.7 C)] 97.5 F (36.4 C) (05/31 0524) Pulse Rate:  [76-85] 76 (05/31 0524) Resp:  [18] 18 (05/31 0524) BP: (97-122)/(53-98) 97/53 (05/31 0524) SpO2:  [98 %] 98 % (05/31 0524) Weight:  [143 lb 9.6 oz (65.1 kg)] 143 lb 9.6 oz (65.1 kg) (05/31 0524) Last BM Date: 12/13/17  Weight change: Filed Weights   12/12/17 0519 12/13/17 0536 12/14/17 0524  Weight: 144 lb (65.3 kg) 144 lb 14.4 oz (65.7 kg) 143 lb 9.6 oz (65.1 kg)    Intake/Output:   Intake/Output Summary (Last 24 hours) at 12/14/2017 0818 Last data filed at 12/14/2017 0525 Gross per 24 hour  Intake 240 ml  Output 3600 ml  Net -3360 ml      Physical Exam    General:  Well appearing. No resp difficulty HEENT: Normal anicteric  Neck: Supple. JVP flat . Carotids 2+ bilat; no bruits. No lymphadenopathy or thyromegaly appreciated. Cor: PMI nondisplaced. Regular rate & rhythm with frequent PVCs. No rubs, gallops or murmurs. 2/6 systolic murmur LUSB Lungs: Clear no wheeze Abdomen: Soft,  nontender, nondistended. No hepatosplenomegaly. No bruits or masses. Good bowel sounds. Extremities: No cyanosis, clubbing, rash, edema warm  Neuro: Alert & orientedx3, cranial nerves grossly intact. moves all 4 extremities w/o difficulty. Affect pleasant   Telemetry   SR with frequent multifocal PVCs (8-35/min) Personally reviewed.   EKG    No new tracings.   Labs    CBC Recent Labs    12/11/17 1250  12/13/17 0452 12/14/17 0436  WBC 3.3*   < > 4.5 3.9*  NEUTROABS 2.1  --   --   --   HGB 14.0   < > 13.8 13.4  HCT 43.0   < > 42.1 40.9  MCV 89.0   < > 87.9 87.4  PLT 148*   < > 147* 157   < > = values in this interval not displayed.   Basic Metabolic Panel Recent Labs    95/28/41 0246 12/14/17 0436  NA 141 136  K 3.4* 3.6  CL 102 99*  CO2 28 24  GLUCOSE 101* 94  BUN 12 11  CREATININE 0.95 1.10*  CALCIUM 9.2 8.9   Liver Function Tests Recent Labs    12/11/17 1250  AST 121*  ALT 123*  ALKPHOS 87  BILITOT 0.9  PROT 7.4  ALBUMIN 4.2   No results for input(s): LIPASE, AMYLASE in the last 72 hours. Cardiac Enzymes Recent Labs  12/11/17 2144 12/12/17 0246 12/12/17 1014  TROPONINI 0.22* 0.18* 0.20*    BNP: BNP (last 3 results) Recent Labs    12/11/17 1250  BNP 902.1*    ProBNP (last 3 results) No results for input(s): PROBNP in the last 8760 hours.   D-Dimer Recent Labs    12/11/17 1250  DDIMER 1.51*   Hemoglobin A1C Recent Labs    12/11/17 2144  HGBA1C 5.7*   Fasting Lipid Panel Recent Labs    12/12/17 0246  CHOL 190  HDL 81  LDLCALC 100*  TRIG 45  CHOLHDL 2.3   Thyroid Function Tests Recent Labs    12/11/17 2144  TSH 0.933    Other results:   Imaging    Mr Cardiac Morphology W Wo Contrast  Result Date: 12/13/2017 CLINICAL DATA:  67 year old female with new diagnosis of non-ischemic cardiomyopathy. EXAM: CARDIAC MRI TECHNIQUE: The patient was scanned on a 1.5 Tesla GE magnet. A dedicated cardiac coil was used.  Functional imaging was done using Fiesta sequences. 2,3, and 4 chamber views were done to assess for RWMA's. Modified Simpson's rule using a short axis stack was used to calculate an ejection fraction on a dedicated work [employer]. The patient received 23 cc of Multihance. After 10 minutes inversion recovery sequences were used to assess for infiltration and scar tissue. CONTRAST:  23 cc  of Multihance FINDINGS: 1. Severely dilated left ventricle with normal wall thickness and severely depressed systolic function (LVEF = 17%). There is severe global hypokinesis with akinesis of the inferior, anteroseptal and anteroseptal walls. There is no late gadolinium enhancement in the left ventricular myocardium. LVEDD: 71 mm LVESD: 68 mm LVEDV: 247 ml LVESV: 203 ml SV: 43 ml CO: 2.9 L/min Myocardial mass: 127 g 2. Normal right ventricular size, thickness and systolic function (LVEF = 53%). There are no regional wall motion abnormalities. 3.  Mildly dilated left and right atrium. 4. Normal size of the aortic root, ascending aorta and pulmonary artery. 5.  Mild mitral and tricuspid regurgitation. 6.  Normal pericardium.  Mild circumferential pericardial effusion. IMPRESSION: 1. Severely dilated left ventricle with normal wall thickness and severely depressed systolic function (LVEF = 17%). There is severe global hypokinesis with akinesis of the inferior, anteroseptal and anteroseptal walls. There is no late gadolinium enhancement in the left ventricular myocardium. 2. Normal right ventricular size, thickness and systolic function (LVEF = 53%). There are no regional wall motion abnormalities. 3.  Mildly dilated left and right atrium. 4.  Mild mitral and tricuspid regurgitation. 5.  Normal pericardium.  Mild circumferential pericardial effusion. Collectively, these findings are consistent with non-ischemic cardiomyopathy. There is no evidence for inflammatory or infiltrative cardiomyopathy. Electronically  Signed   By: Savannah Bell   On: 12/13/2017 11:23      Medications:     Scheduled Medications: . amiodarone  400 mg Oral BID  . atorvastatin  40 mg Oral q1800  . sodium chloride flush  3 mL Intravenous Q12H  . spironolactone  12.5 mg Oral Daily     Infusions: . sodium chloride    . sodium chloride       PRN Medications:  sodium chloride, sodium chloride, nitroGLYCERIN, oxyCODONE, sodium chloride flush    Patient Profile   UNDREA ARCHBOLD is a 67 y.o. female with no medical history.   Admitted for evaluation and treatment of acute onset of SOB.   Assessment/Plan   1. 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 Normal coronaries on [facility] 12/11/17.  cMRI shows no infiltrative or inflammatory CM. No family hx of HF. No ETOH. No hx of HTN.  She has 2 children in their 92s and had no SOB after pregnancies. No recent virus that she is aware of, but she did have a nonproductive cough 2 months ago with no fever or chills. Initial EKG showed incomplete LBBB with QRS 108 ms. She has frequent PVCs (~20%), which may be causing her HF. She does not snore but thinks she goes apneic.  - Echo 5/29 EF 15% with grade 1 DD, no LV thrombus, mild MR, trivial TR, PA peak pressure 31 mmHg, and trivial pericardial effusion.  - Volume status stable on exam.  - Continue spiro 12.5 mg daily - Start losartan 12.5 mg qHS. SBP 88-120s.  - Overnight oximetry not completed. Will discuss with RN about completing tonight.  - Consult cardiac rehab.   2. SOB - CTA negative for PE - LHC showed normal coronaries. - Resolved with diuresis. No change.   3. Hyperlipidemia - Continue statin. 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  - Most likely cause of her NICM - Continue amiodarone 400 mg BID to suppress PVCs.   Medication concerns reviewed with patient and pharmacy team. Barriers identified: none.   Length of Stay: 3  Alford Highland, NP  12/14/2017, 8:18 AM  Advanced Heart Failure  Team Pager [phone] (M-F; 7a - 4p)  Please contact [facility] for night-coverage after hours (4p -7a ) and weekends on [email or web address]  Patient seen and examined with the above-signed Advanced Practice Provider and/or Housestaff. I personally reviewed laboratory data, imaging studies and relevant notes. I independently examined the patient and formulated the important aspects of the plan. I have edited the note to reflect any of my changes or salient points. I have personally discussed the plan with the patient and/or family.  Volume status improved with IV lasix overnight. Still with frequent PVCs ~15-20 per minute. Will give 24-48 hour load with IV amio. Can likely go home on Sunday.   EF 15-20% No CAD. No LGE on MRI. 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   Start lasix 20 daily Spiro 12.5 Losartan 12.5 as BP tolerates.  BP too low for b-blocker at this point.   Will need f/u HF Clinic  one week. Hopefully EF will improve with PVC suppression.   [name], MD  6:34 PM

## 2017-12-14 NOTE — Progress Notes (Signed)
  Amiodarone Drug - Drug Interaction Consult Note  Recommendations: No recommendations, monitor for now.  Amiodarone is metabolized by the cytochrome P450 system and therefore has the potential to cause many drug interactions. Amiodarone has an average plasma half-life of 50 days (range 20 to 100 days).   There is potential for drug interactions to occur several weeks or months after stopping treatment and the onset of drug interactions may be slow after initiating amiodarone.   [x]  Statins: Increased risk of myopathy. Simvastatin- restrict dose to 20mg  daily. Other statins: counsel patients to report any muscle pain or weakness immediately.  []  Anticoagulants: Amiodarone can increase anticoagulant effect. Consider warfarin dose reduction. Patients should be monitored closely and the dose of anticoagulant altered accordingly, remembering that amiodarone levels take several weeks to stabilize.  []  Antiepileptics: Amiodarone can increase plasma concentration of phenytoin, the dose should be reduced. Note that small changes in phenytoin dose can result in large changes in levels. Monitor patient and counsel on signs of toxicity.  []  Beta blockers: increased risk of bradycardia, AV block and myocardial depression. Sotalol - avoid concomitant use.  []   Calcium channel blockers (diltiazem and verapamil): increased risk of bradycardia, AV block and myocardial depression.  []   Cyclosporine: Amiodarone increases levels of cyclosporine. Reduced dose of cyclosporine is recommended.  []  Digoxin dose should be halved when amiodarone is started.  []  Diuretics: increased risk of cardiotoxicity if hypokalemia occurs.  []  Oral hypoglycemic agents (glyburide, glipizide, glimepiride): increased risk of hypoglycemia. Patient's glucose levels should be monitored closely when initiating amiodarone therapy.   []  Drugs that prolong the QT interval:  Torsades de pointes risk may be increased with concurrent use -  avoid if possible.  Monitor QTc, also keep magnesium/potassium WNL if concurrent therapy can't be avoided. Marland Kitchen Antibiotics: e.g. fluoroquinolones, erythromycin. . Antiarrhythmics: e.g. quinidine, procainamide, disopyramide, sotalol. . Antipsychotics: e.g. phenothiazines, haloperidol.  . Lithium, tricyclic antidepressants, and methadone. Thank You,  Gardner Candle  12/14/2017 1:45 PM

## 2017-12-14 NOTE — Progress Notes (Signed)
BP during bolus of amiodarone 87/58 HR 87. After bolus BP 93/56 HR 88. NP paged, will continue to monitor.

## 2017-12-15 DIAGNOSIS — I519 Heart disease, unspecified: Secondary | ICD-10-CM

## 2017-12-15 DIAGNOSIS — E782 Mixed hyperlipidemia: Secondary | ICD-10-CM

## 2017-12-15 LAB — BASIC METABOLIC PANEL
Anion gap: 7 (ref 5–15)
BUN: 13 mg/dL (ref 6–20)
CO2: 28 mmol/L (ref 22–32)
Calcium: 8.9 mg/dL (ref 8.9–10.3)
Chloride: 104 mmol/L (ref 101–111)
Creatinine, Ser: 1.15 mg/dL — ABNORMAL HIGH (ref 0.44–1.00)
GFR calc Af Amer: 56 mL/min — ABNORMAL LOW (ref >60)
GFR calc non Af Amer: 48 mL/min — ABNORMAL LOW (ref >60)
Glucose, Bld: 111 mg/dL — ABNORMAL HIGH (ref 65–99)
Potassium: 3.9 mmol/L (ref 3.5–5.1)
Sodium: 139 mmol/L (ref 135–145)

## 2017-12-15 LAB — CBC
HCT: 41 % (ref 36.0–46.0)
Hemoglobin: 13.4 g/dL (ref 12.0–15.0)
MCH: 28.8 pg (ref 26.0–34.0)
MCHC: 32.7 g/dL (ref 30.0–36.0)
MCV: 88 fL (ref 78.0–100.0)
Platelets: 161 10*3/uL (ref 150–400)
RBC: 4.66 MIL/uL (ref 3.87–5.11)
RDW: 13.2 % (ref 11.5–15.5)
WBC: 3.7 10*3/uL — ABNORMAL LOW (ref 4.0–10.5)

## 2017-12-15 MED ORDER — FUROSEMIDE 20 MG PO TABS
20.0000 mg | ORAL_TABLET | Freq: Every day | ORAL | Status: DC
  Administered 2017-12-15 – 2017-12-16 (×2): 20 mg via ORAL
  Filled 2017-12-15 (×2): qty 1

## 2017-12-15 NOTE — Progress Notes (Signed)
Progress Note  Patient Name: Savannah Bell Date of Encounter: 12/15/2017  Primary Cardiologist: [name], MD   Subjective   Feeling well this morning. No complaints.  She denies chest pain, leg swelling, palpitations, and shortness of breath.  She has slept with 2 pillows for decades due to lower back discomfort.  Inpatient Medications    Scheduled Meds: . atorvastatin  40 mg Oral q1800  . losartan  12.5 mg Oral QHS  . sodium chloride flush  3 mL Intravenous Q12H  . spironolactone  12.5 mg Oral Daily   Continuous Infusions: . sodium chloride    . sodium chloride    . amiodarone 30 mg/hr (12/15/17 0229)   PRN Meds: sodium chloride, sodium chloride, nitroGLYCERIN, oxyCODONE, sodium chloride flush   Vital Signs    Vitals:   12/14/17 1945 12/15/17 0519 12/15/17 0951 12/15/17 0954  BP: 102/62 (!) 91/52 (!) 88/54 (!) 90/52  Pulse: 85 75    Resp: 16 18    Temp: 97.9 F (36.6 C) 97.8 F (36.6 C)    TempSrc: Oral Oral    SpO2: 100% 100%    Weight:  145 lb 3.2 oz (65.9 kg)    Height:        Intake/Output Summary (Last 24 hours) at 12/15/2017 1002 Last data filed at 12/15/2017 1000 Gross per 24 hour  Intake 793.55 ml  Output 2200 ml  Net -1406.45 ml   Filed Weights   12/13/17 0536 12/14/17 0524 12/15/17 0519  Weight: 144 lb 14.4 oz (65.7 kg) 143 lb 9.6 oz (65.1 kg) 145 lb 3.2 oz (65.9 kg)    Telemetry    NSR - Personally Reviewed  ECG    n/a - Personally Reviewed  Physical Exam   GEN: No acute distress.   Neck: No JVD Cardiac: RRR, no murmurs, rubs, or gallops.  Respiratory: Clear to auscultation bilaterally. GI: Soft, nontender, non-distended  MS: No edema; No deformity. Neuro:  Nonfocal  Psych: Normal affect   Labs    Chemistry Recent Labs  Lab 12/11/17 1250 12/12/17 0246 12/14/17 0436 12/15/17 0745  NA 139 141 136 139  K 3.9 3.4* 3.6 3.9  CL 104 102 99* 104  CO2 26 28 24 28   GLUCOSE 103* 101* 94 111*  BUN 14 12 11 13     CREATININE 0.87 0.95 1.10* 1.15*  CALCIUM 9.0 9.2 8.9 8.9  PROT 7.4  --   --   --   ALBUMIN 4.2  --   --   --   AST 121*  --   --   --   ALT 123*  --   --   --   ALKPHOS 87  --   --   --   BILITOT 0.9  --   --   --   GFRNONAA >60 >60 51* 48*  GFRAA >60 >60 59* 56*  ANIONGAP 9 11 13 7      Hematology Recent Labs  Lab 12/13/17 0452 12/14/17 0436 12/15/17 0745  WBC 4.5 3.9* 3.7*  RBC 4.79 4.68 4.66  HGB 13.8 13.4 13.4  HCT 42.1 40.9 41.0  MCV 87.9 87.4 88.0  MCH 28.8 28.6 28.8  MCHC 32.8 32.8 32.7  RDW 13.2 13.2 13.2  PLT 147* 157 161    Cardiac Enzymes Recent Labs  Lab 12/11/17 2144 12/12/17 0246 12/12/17 1014  TROPONINI 0.22* 0.18* 0.20*    Recent Labs  Lab 12/11/17 1255  TROPIPOC 0.13*     BNP Recent Labs  Lab 12/11/17 1250  BNP 902.1*     DDimer  Recent Labs  Lab 12/11/17 1250  DDIMER 1.51*     Radiology    Mr Cardiac Morphology W Wo Contrast  Result Date: 12/13/2017 CLINICAL DATA:  67 year old female with new diagnosis of non-ischemic cardiomyopathy. EXAM: CARDIAC MRI TECHNIQUE: The patient was scanned on a 1.5 Tesla GE magnet. A dedicated cardiac coil was used. Functional imaging was done using Fiesta sequences. 2,3, and 4 chamber views were done to assess for RWMA's. Modified Simpson's rule using a short axis stack was used to calculate an ejection fraction on a dedicated work [employer]. The patient received 23 cc of Multihance. After 10 minutes inversion recovery sequences were used to assess for infiltration and scar tissue. CONTRAST:  23 cc  of Multihance FINDINGS: 1. Severely dilated left ventricle with normal wall thickness and severely depressed systolic function (LVEF = 17%). There is severe global hypokinesis with akinesis of the inferior, anteroseptal and anteroseptal walls. There is no late gadolinium enhancement in the left ventricular myocardium. LVEDD: 71 mm LVESD: 68 mm LVEDV: 247 ml LVESV: 203 ml SV: 43 ml CO: 2.9  L/min Myocardial mass: 127 g 2. Normal right ventricular size, thickness and systolic function (LVEF = 53%). There are no regional wall motion abnormalities. 3.  Mildly dilated left and right atrium. 4. Normal size of the aortic root, ascending aorta and pulmonary artery. 5.  Mild mitral and tricuspid regurgitation. 6.  Normal pericardium.  Mild circumferential pericardial effusion. IMPRESSION: 1. Severely dilated left ventricle with normal wall thickness and severely depressed systolic function (LVEF = 17%). There is severe global hypokinesis with akinesis of the inferior, anteroseptal and anteroseptal walls. There is no late gadolinium enhancement in the left ventricular myocardium. 2. Normal right ventricular size, thickness and systolic function (LVEF = 53%). There are no regional wall motion abnormalities. 3.  Mildly dilated left and right atrium. 4.  Mild mitral and tricuspid regurgitation. 5.  Normal pericardium.  Mild circumferential pericardial effusion. Collectively, these findings are consistent with non-ischemic cardiomyopathy. There is no evidence for inflammatory or infiltrative cardiomyopathy. Electronically Signed   By: Tobias Alexander   On: 12/13/2017 11:23    Cardiac Studies   Cath (12/12/17):   Global left ventricular systolic dysfunction.  EF 10 to 20% by echocardiogram.  Normal left ventricular filling pressures.  Normal coronary arteries, right dominant system.  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   Echo 95/29/19):  Study Conclusions  - Left ventricle: The cavity size was normal. Wall thickness was   normal. The estimated ejection fraction was 15%. Diffuse   hypokinesis. No mural thrombus with Definity contrast. Doppler   parameters are consistent with abnormal left ventricular   relaxation (grade 1 diastolic dysfunction). LV filling pressure   is elevated. - Mitral valve: Mildly thickened leaflets . There was  mild   regurgitation. - Left atrium: The atrium was normal in size. - Tricuspid valve: There was trivial regurgitation. - Pulmonary arteries: PA peak pressure: 31 mm Hg (S). - Inferior vena cava: The vessel was normal in size. The   respirophasic diameter changes were in the normal range (>= 50%),   consistent with normal central venous pressure. - Pericardium, extracardiac: A trivial pericardial effusion was   identified. Features were not consistent with tamponade   physiology.  Impressions:  - LVEF 15%, severe global hypokinesis, normal LV cavity size and   wall thickness, grade 1 DD  and elevated LV filling pressure, mild   MR, normal LA size, trivial TR, RVSP 31 mmHg, normal IVC, trivial   pericardial effusion without tamponade, no mural thrombus with   Definity contrast.   Patient Profile     67 y.o. female with no medical history admitted for the evaluation of acute onset of shortness of breath.  Assessment & Plan    1. 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 Normal coronaries on [facility] 12/11/17. cMRI shows no infiltrative or inflammatory CM.No family hx of HF. No ETOH. No hx of HTN. She has 2 children in their 15s and had no SOB after pregnancies. No recent virus that she is aware of, but she did have a nonproductive cough 2 months ago with no fever or chills.Initial EKG showed incomplete LBBB with QRS 108 ms.She has been having frequent PVCs (~20%), which may be causing her HF. She does not snore but thinks she goes apneic. She is now on IV amiodarone with good PVC suppression. -Over 4.6 L output in last 48 hours. -Echo 5/29 EF 15% with grade 1 DD, no LV thrombus, mild MR, trivial TR, PA peak pressure 31 mmHg, and trivial pericardial effusion.  - Volume statusstable on exam.  -Continue spiro 12.5 mg daily - Continue losartan 12.5 mg qHS. SBP currently in 90s.  -BP too low for beta blockers. - Will start Lasix 20 mg daily. - Will need cardiac rehab.  - Will need fu in CHF  clinic in a week. Hope is that EF will improve with PVC suppression.  2. SOB - CTA negative for PE - LHC showed normal coronaries. - Resolved with diuresis. No change.   3. Hyperlipidemia - Continue statin. LDL 100 12/12/17  4. Frequent PVCs -Diminished on IV amiodarone. Will continue until tomorrow morning and then start oral amiodarone. -Most likely cause of her NICM     For questions or updates, please contact [facility] Please consult [email or web address] for contact info under Cardiology/STEMI.      Signed, Prentice Docker, MD  12/15/2017, 10:02 AM

## 2017-12-16 DIAGNOSIS — E7800 Pure hypercholesterolemia, unspecified: Secondary | ICD-10-CM

## 2017-12-16 DIAGNOSIS — I428 Other cardiomyopathies: Secondary | ICD-10-CM

## 2017-12-16 LAB — CBC
HCT: 39.7 % (ref 36.0–46.0)
Hemoglobin: 12.8 g/dL (ref 12.0–15.0)
MCH: 28.7 pg (ref 26.0–34.0)
MCHC: 32.2 g/dL (ref 30.0–36.0)
MCV: 89 fL (ref 78.0–100.0)
Platelets: 145 10*3/uL — ABNORMAL LOW (ref 150–400)
RBC: 4.46 MIL/uL (ref 3.87–5.11)
RDW: 13.2 % (ref 11.5–15.5)
WBC: 4.2 10*3/uL (ref 4.0–10.5)

## 2017-12-16 LAB — BASIC METABOLIC PANEL
Anion gap: 7 (ref 5–15)
BUN: 12 mg/dL (ref 6–20)
CO2: 29 mmol/L (ref 22–32)
Calcium: 8.9 mg/dL (ref 8.9–10.3)
Chloride: 103 mmol/L (ref 101–111)
Creatinine, Ser: 1.23 mg/dL — ABNORMAL HIGH (ref 0.44–1.00)
GFR calc Af Amer: 52 mL/min — ABNORMAL LOW (ref >60)
GFR calc non Af Amer: 45 mL/min — ABNORMAL LOW (ref >60)
Glucose, Bld: 95 mg/dL (ref 65–99)
Potassium: 4.1 mmol/L (ref 3.5–5.1)
Sodium: 139 mmol/L (ref 135–145)

## 2017-12-16 MED ORDER — ATORVASTATIN CALCIUM 40 MG PO TABS
40.0000 mg | ORAL_TABLET | Freq: Every day | ORAL | 6 refills | Status: DC
Start: 2017-12-16 — End: 2018-02-28

## 2017-12-16 MED ORDER — LOSARTAN POTASSIUM 25 MG PO TABS: 12.5000 mg | ORAL_TABLET | Freq: Every day | ORAL | 6 refills | Status: DC

## 2017-12-16 MED ORDER — AMIODARONE HCL 200 MG PO TABS: 200.0000 mg | ORAL_TABLET | Freq: Every day | ORAL | 2 refills | Status: DC

## 2017-12-16 MED ORDER — FUROSEMIDE 20 MG PO TABS: 20.0000 mg | ORAL_TABLET | Freq: Every day | ORAL | 6 refills | Status: DC

## 2017-12-16 MED ORDER — AMIODARONE HCL 200 MG PO TABS: 200.0000 mg | ORAL_TABLET | Freq: Every day | ORAL | Status: DC

## 2017-12-16 MED ORDER — SPIRONOLACTONE 25 MG PO TABS: 12.5000 mg | ORAL_TABLET | Freq: Every day | ORAL | 6 refills | Status: DC

## 2017-12-16 NOTE — Progress Notes (Signed)
Patient is being discharge per MD order, discharge teachings and summary review with patient, denies any questions or concerns at this time.

## 2017-12-16 NOTE — Progress Notes (Signed)
Progress Note  Patient Name: Savannah Bell Date of Encounter: 12/16/2017  Primary Cardiologist: [name], MD   Subjective   Feeling well. Denies chest pain and shortness of breath as well as palpitations.  She has slept with 2 pillows for decades due to lower back discomfort.  Inpatient Medications    Scheduled Meds: . [START ON 12/17/2017] amiodarone  200 mg Oral Daily  . atorvastatin  40 mg Oral q1800  . furosemide  20 mg Oral Daily  . losartan  12.5 mg Oral QHS  . sodium chloride flush  3 mL Intravenous Q12H  . spironolactone  12.5 mg Oral Daily   Continuous Infusions: . sodium chloride    . sodium chloride     PRN Meds: sodium chloride, sodium chloride, nitroGLYCERIN, oxyCODONE, sodium chloride flush   Vital Signs    Vitals:   12/15/17 1234 12/15/17 1934 12/16/17 0500 12/16/17 0738  BP: 93/67 (!) 99/58 (!) 102/57 (!) 93/54  Pulse: 78 79 73 72  Resp: 20 18 18 18   Temp: 97.9 F (36.6 C) 97.8 F (36.6 C) 97.8 F (36.6 C) 97.6 F (36.4 C)  TempSrc: Oral  Oral Oral  SpO2: 99% 100% 100% 100%  Weight:   145 lb 14.4 oz (66.2 kg)   Height:        Intake/Output Summary (Last 24 hours) at 12/16/2017 0922 Last data filed at 12/16/2017 4098 Gross per 24 hour  Intake 1546.1 ml  Output 2700 ml  Net -1153.9 ml   Filed Weights   12/14/17 0524 12/15/17 0519 12/16/17 0500  Weight: 143 lb 9.6 oz (65.1 kg) 145 lb 3.2 oz (65.9 kg) 145 lb 14.4 oz (66.2 kg)    Telemetry    Sinus rhythm - Personally Reviewed  ECG    n/a - Personally Reviewed  Physical Exam   GEN: No acute distress.   Neck: No JVD Cardiac: RRR, no murmurs, rubs, or gallops.  Respiratory: Clear to auscultation bilaterally. GI: Soft, nontender, non-distended  MS: No edema; No deformity. Neuro:  Nonfocal  Psych: Normal affect   Labs    Chemistry Recent Labs  Lab 12/11/17 1250  12/14/17 0436 12/15/17 0745 12/16/17 0614  NA 139   < > 136 139 139  K 3.9   < > 3.6 3.9 4.1  CL 104    < > 99* 104 103  CO2 26   < > 24 28 29   GLUCOSE 103*   < > 94 111* 95  BUN 14   < > 11 13 12   CREATININE 0.87   < > 1.10* 1.15* 1.23*  CALCIUM 9.0   < > 8.9 8.9 8.9  PROT 7.4  --   --   --   --   ALBUMIN 4.2  --   --   --   --   AST 121*  --   --   --   --   ALT 123*  --   --   --   --   ALKPHOS 87  --   --   --   --   BILITOT 0.9  --   --   --   --   GFRNONAA >60   < > 51* 48* 45*  GFRAA >60   < > 59* 56* 52*  ANIONGAP 9   < > 13 7 7    < > = values in this interval not displayed.     Hematology Recent Labs  Lab 12/14/17 [id number] 12/15/17 0745  12/16/17 0614  WBC 3.9* 3.7* 4.2  RBC 4.68 4.66 4.46  HGB 13.4 13.4 12.8  HCT 40.9 41.0 39.7  MCV 87.4 88.0 89.0  MCH 28.6 28.8 28.7  MCHC 32.8 32.7 32.2  RDW 13.2 13.2 13.2  PLT 157 161 145*    Cardiac Enzymes Recent Labs  Lab 12/11/17 2144 12/12/17 0246 12/12/17 1014  TROPONINI 0.22* 0.18* 0.20*    Recent Labs  Lab 12/11/17 1255  TROPIPOC 0.13*     BNP Recent Labs  Lab 12/11/17 1250  BNP 902.1*     DDimer  Recent Labs  Lab 12/11/17 1250  DDIMER 1.51*     Radiology    No results found.  Cardiac Studies   Cath (12/12/17):   Global left ventricular systolic dysfunction. EF 10 to 20% by echocardiogram. Normal left ventricular filling pressures.  Normal coronary arteries, right dominant system.  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   Echo 95/29/19):  Study Conclusions  - Left ventricle: The cavity size was normal. Wall thickness was normal. The estimated ejection fraction was 15%. Diffuse hypokinesis. No mural thrombus with Definity contrast. Doppler parameters are consistent with abnormal left ventricular relaxation (grade 1 diastolic dysfunction). LV filling pressure is elevated. - Mitral valve: Mildly thickened leaflets . There was mild regurgitation. - Left atrium: The atrium was normal in size. - Tricuspid valve:  There was trivial regurgitation. - Pulmonary arteries: PA peak pressure: 31 mm Hg (S). - Inferior vena cava: The vessel was normal in size. The respirophasic diameter changes were in the normal range (>= 50%), consistent with normal central venous pressure. - Pericardium, extracardiac: A trivial pericardial effusion was identified. Features were not consistent with tamponade physiology.  Impressions:  - LVEF 15%, severe global hypokinesis, normal LV cavity size and wall thickness, grade 1 DD and elevated LV filling pressure, mild MR, normal LA size, trivial TR, RVSP 31 mmHg, normal IVC, trivial pericardial effusion without tamponade, no mural thrombus with Definity contrast.  Patient Profile     67 y.o. female with no medical history admitted for the evaluation of acute onset of shortness of breath.  Assessment & Plan    1. 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 Normal coronaries on [facility] 12/11/17. cMRI shows no infiltrative or inflammatory CM.No family hx of HF. No ETOH. No hx of HTN. She has 2 children in their 21s and had no SOB after pregnancies. No recent virus that she is aware of, but she did have a nonproductive cough 2 months ago with no fever or chills.Initial EKG showed incomplete LBBB with QRS 108 ms.She has been having frequent PVCs (~20%), which may be causing her HF. She does not snore but thinks she goes apneic.  She is now on IV amiodarone with good PVC suppression. -I will dc IV amio and start oral 200 mg daily. -Over 1.1 L output in last 24 hours. -I started Lasix 20 mg daily on 6/1. Creatinine up to 1.23 today (1.15 yesterday). I will have BMET repeated tomorrow in outpatient setting. If it continues to rise, Lasix should be 20 mg prn for SOB/leg swelling.  -Echo 5/29 EF 15% with grade 1 DD, no LV thrombus, mild MR, trivial TR, PA peak pressure 31 mmHg, and trivial pericardial effusion.  - Volume statusstable on exam. -Continuespiro 12.5 mg  daily - Continue losartan 12.5 mg qHS.SBP currently in 90s. -BP too low for beta blockers. -Will need cardiac rehab. - Will need fu in CHF clinic  in a week. Hope is that EF will improve with PVC suppression.  2. SOB - CTA negative for PE - LHC showed normal coronaries. - Resolved with diuresis.No change.  3. 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 I will dc IV amio and start oral 200 mg daily Most likely cause of her NICM.  Disposition: Will discharge to home today. BMET tomorrow.   For questions or updates, please contact [facility] Please consult [email or web address] for contact info under Cardiology/STEMI.      Signed, Prentice Docker, MD  12/16/2017, 9:22 AM

## 2017-12-16 NOTE — Discharge Summary (Signed)
Discharge Summary    Patient ID: Savannah Bell,  MRN: [id number], DOB/AGE: [birth year] 66 y.o.  Admit date: 12/11/2017 Discharge date: 12/16/2017  Primary Care Provider: [name] (Inactive) Primary Cardiologist:  Dr. [name]  Discharge Diagnoses    Principal Problem:   NSTEMI (non-ST elevated myocardial infarction) [facility]) Active Problems:   Acute heart failure (HCC)   Acute on chronic systolic heart failure (HCC)   Elevated troponin   Allergies No Known Allergies  Diagnostic Studies/Procedures    Cath (12/12/17):   Global left ventricular systolic dysfunction. EF 10 to 20% by echocardiogram. Normal left ventricular filling pressures.  Normal coronary arteries, right dominant system.  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  RECOMMENDATIONS:  With normal coronary arteries and left ventricular systolic dysfunction in the setting of normal LV cavity size, myocarditis is a consideration.  We will order a CRP and sed rate.  Consider cardiac MRI  Echo 12/12/17:  Study Conclusions  - Left ventricle: The cavity size was normal. Wall thickness was normal. The estimated ejection fraction was 15%. Diffuse hypokinesis. No mural thrombus with Definity contrast. Doppler parameters are consistent with abnormal left ventricular relaxation (grade 1 diastolic dysfunction). LV filling pressure is elevated. - Mitral valve: Mildly thickened leaflets . There was mild regurgitation. - Left atrium: The atrium was normal in size. - Tricuspid valve: There was trivial regurgitation. - Pulmonary arteries: PA peak pressure: 31 mm Hg (S). - Inferior vena cava: The vessel was normal in size. The respirophasic diameter changes were in the normal range (>= 50%), consistent with normal central venous pressure. - Pericardium, extracardiac: A trivial pericardial effusion was identified. Features were not  consistent with tamponade physiology.  Impressions:  - LVEF 15%, severe global hypokinesis, normal LV cavity size and wall thickness, grade 1 DD and elevated LV filling pressure, mild MR, normal LA size, trivial TR, RVSP 31 mmHg, normal IVC, trivial pericardial effusion without tamponade, no mural thrombus with Definity contrast.  History of Present Illness     Savannah Bell is a 67 y.o. female with no significant past medical history presented 12/11/17 for evaluation of acute onset shortness of breath.  Savannah Bell was in usual state of health up until morning of 12/11/17 when she woke up with acute onset shortness of breath.  She works at night at [employer].  She went to bed around 6:30 AM however, woke up at 730 with acute onset shortness of breath.  She described her shortness of breath at substernal area and unable to take a deep breath.  No associated radiation, palpitation, nausea or vomiting.  Her symptoms lasted approximately 2 hours at intensity of 8-9 out of 10.  By the time she went to see her PCP her symptoms were completely resolved.  EKG at PCP office showed sinus rhythm at rate of 101 bpm, incomplete left bundle branch block and T wave inversion in lateral leads. She was sent to the ER for further evaluation.  D-dimer was elevated at 1.51.  Follow-up CT angiogram chest did not showed pulmonary embolism however evidence of CHF.  BNP 902.  Point-of-care troponin 0.13.  Patient was given IV Lasix and started on IV heparin.  She continues to have intermittent brief episode of shortness of breath with less intensity.  Patient denies any prior cardiac history.  No history of hypertension, hyperlipidemia or diabetes.  Remote history of tobacco smoking, quit 30 years ago.  She might have smoke on and  off for 10 to 15 years. Denies illicit drug use or alcohol abuse.  Her mother died on table during some type of cardiac surgery.   Hospital Course     Consultants:  CHF  Patient was admitted by cardiology team and started on IV diuresis and heparin drip. Troponin trend 0.22 > 0.18 > 0.20. Echo on 5/29 showed EF 15% with grade 1 DD, no LV thrombus, mild MR, trivial TR, PA peak pressure 31 mmHg, and trivial pericardial effusion.  She underwent R/LHC on 5/29 that showed normal coronary arteries, EF 10-20%, normal LV filling pressures. There was concern for myocarditis, so CRP, sed rate, and cardiac MRI were ordered. CRP elevated. Sed rate was normal. Cardiac MRI showed EF 17%, findings consistent with NICM with no evidence of LGE to suggest inflammatory or infiltrative cardiomyopathy.  1. 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  -Cath, echocardiogram and cardiac MRI as noted above.  Patient does not have any history of CHF, alcohol abuse or hypertension.  No viral prodrome.  No family history of sudden cardiac death. -Recent had frequent PVC on telemetry, approximately 20%, felt likely culprit for cardiomyopathy. -Patient was seen by CHF team and titrated heart failure therapy.  Her shortness of breath resolved with diuresis.  Net I&O 7.6 L.  Weight down 7 LB (152-->145lb).  No beta-blocker given low soft blood pressure.  Consider as outpatient.  Continue Spironolactone and losartan.  2. Hyperlipidemia - Continue statin. LDL 100 12/12/17 - LFTs and Lipid panel in 6 weeks.   3. 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  -Most likely cause of her NICM - Continue amiodarone 400 mg BID to suppress PVCs.  The patient has been seen by Dr. Purvis Sheffield today and deemed ready for discharge home. All follow-up appointments have been scheduled. Discharge medications are listed below.     Discharge Vitals Blood pressure (!) 93/54, pulse 72, temperature 97.6 F (36.4 C), temperature source Oral, resp. rate 18, height 4\' 9"  (1.448 m), weight 145 lb 14.4 oz (66.2 kg), SpO2 100 %.  Filed Weights   12/14/17 0524 12/15/17 0519 12/16/17 0500  Weight: 143 lb 9.6 oz (65.1 kg) 145 lb 3.2  oz (65.9 kg) 145 lb 14.4 oz (66.2 kg)    Labs & Radiologic Studies     CBC Recent Labs    12/15/17 0745 12/16/17 0614  WBC 3.7* 4.2  HGB 13.4 12.8  HCT 41.0 39.7  MCV 88.0 89.0  PLT 161 145*   Basic Metabolic Panel Recent Labs    91/47/82 0745 12/16/17 0614  NA 139 139  K 3.9 4.1  CL 104 103  CO2 28 29  GLUCOSE 111* 95  BUN 13 12  CREATININE 1.15* 1.23*  CALCIUM 8.9 8.9    Dg Chest 2 View  Result Date: 12/11/2017 CLINICAL DATA:  Shortness of breath EXAM: CHEST - 2 VIEW COMPARISON:  09/06/2011 FINDINGS: Mild enlargement of the cardiopericardial silhouette with upper zone pulmonary vascular indistinctness and Kerley B lines indicating interstitial edema. Bibasilar subsegmental atelectasis. No significant pleural effusion. IMPRESSION: 1. Appearance compatible with mild to moderate congestive heart failure, with mild enlargement of the cardiopericardial silhouette, pulmonary venous hypertension, and interstitial pulmonary edema. Electronically Signed   By: Gaylyn Rong M.D.   On: 12/11/2017 12:14   Ct Angio Chest Pe W And/or Wo Contrast  Result Date: 12/11/2017 CLINICAL DATA:  Acute presentation with shortness of breath and tachycardia. EXAM: CT ANGIOGRAPHY CHEST WITH CONTRAST TECHNIQUE: Multidetector CT imaging of the chest was performed using the  standard protocol during bolus administration of intravenous contrast. Multiplanar CT image reconstructions and MIPs were obtained to evaluate the vascular anatomy. CONTRAST:  1mL ISOVUE-370 IOPAMIDOL (ISOVUE-370) INJECTION 76% COMPARISON:  Chest radiography same day FINDINGS: Cardiovascular: Pulmonary arterial opacification is good. There are no pulmonary emboli. There is mild aortic atherosclerosis. No sign of aneurysm. Heart size is upper limits of normal, with a dilated left ventricle. No pericardial fluid. Mediastinum/Nodes: No mass or lymphadenopathy. Lungs/Pleura: Bilateral pleural effusions layering dependently with  dependent atelectasis. There is interstitial pulmonary edema and early alveolar edema. Upper Abdomen: Negative Musculoskeletal: Ordinary spinal degenerative changes. 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 Review of the MIP images confirms the above findings. IMPRESSION: Findings consistent with congestive heart failure. Dilated left ventricle. Bilateral pleural effusions layering dependently. Interstitial and early alveolar pulmonary edema. No pulmonary emboli. Mild aortic atherosclerosis. No visible coronary artery calcification. Electronically Signed   By: Paulina Fusi M.D.   On: 12/11/2017 14:59   Mr Cardiac Morphology W Wo Contrast  Result Date: 12/13/2017 CLINICAL DATA:  67 year old female with new diagnosis of non-ischemic cardiomyopathy. EXAM: CARDIAC MRI TECHNIQUE: The patient was scanned on a 1.5 Tesla GE magnet. A dedicated cardiac coil was used. Functional imaging was done using Fiesta sequences. 2,3, and 4 chamber views were done to assess for RWMA's. Modified Simpson's rule using a short axis stack was used to calculate an ejection fraction on a dedicated work [employer]. The patient received 23 cc of Multihance. After 10 minutes inversion recovery sequences were used to assess for infiltration and scar tissue. CONTRAST:  23 cc  of Multihance FINDINGS: 1. Severely dilated left ventricle with normal wall thickness and severely depressed systolic function (LVEF = 17%). There is severe global hypokinesis with akinesis of the inferior, anteroseptal and anteroseptal walls. There is no late gadolinium enhancement in the left ventricular myocardium. LVEDD: 71 mm LVESD: 68 mm LVEDV: 247 ml LVESV: 203 ml SV: 43 ml CO: 2.9 L/min Myocardial mass: 127 g 2. Normal right ventricular size, thickness and systolic function (LVEF = 53%). There are no regional wall motion abnormalities. 3.  Mildly dilated left and right atrium. 4. Normal size of the aortic root, ascending aorta and  pulmonary artery. 5.  Mild mitral and tricuspid regurgitation. 6.  Normal pericardium.  Mild circumferential pericardial effusion. IMPRESSION: 1. Severely dilated left ventricle with normal wall thickness and severely depressed systolic function (LVEF = 17%). There is severe global hypokinesis with akinesis of the inferior, anteroseptal and anteroseptal walls. There is no late gadolinium enhancement in the left ventricular myocardium. 2. Normal right ventricular size, thickness and systolic function (LVEF = 53%). There are no regional wall motion abnormalities. 3.  Mildly dilated left and right atrium. 4.  Mild mitral and tricuspid regurgitation. 5.  Normal pericardium.  Mild circumferential pericardial effusion. Collectively, these findings are consistent with non-ischemic cardiomyopathy. There is no evidence for inflammatory or infiltrative cardiomyopathy. Electronically Signed   By: Tobias Alexander   On: 12/13/2017 11:23    Disposition   Pt is being discharged home today in good condition.  Follow-up Plans & Appointments    Follow-up Information    Graciella Freer, PA-C Follow up on 12/25/2017.   Specialty:  Physician Assistant Why:  Heart Failure Followup-11:30-Parking at ER lot (enter under blue "Specialty Clinics" awning) or under [facility] on [facility] [employer], garage code: 1300, elevator 1st floor). Take all am meds, bring all med bottles. Contact information: [address]  [place] [address] [phone]          Discharge Instructions    Amb Referral to Cardiac Rehabilitation   Complete by:  As directed    Diagnosis:  Heart Failure (see criteria below if ordering Phase II)   Heart Failure Type:  Chronic Systolic   Diet - low sodium heart healthy   Complete by:  As directed    Discharge instructions   Complete by:  As directed    *Weigh yourself on the same scale at same time of day and keep a log. *Report weight gain of > 3 lbs in 1 day  or 5 lbs over the course of a week and/or symptoms of excess fluid (shortness of breath, difficulty lying flat, swelling, poor appetite, abdominal fullness/bloating, etc) to your doctor immediately. *Avoid foods that are high in sodium (processed, pre-packaged/canned goods, fast foods, etc). *Please attend all scheduled and reccommended follow up appointments   Increase activity slowly   Complete by:  As directed       Discharge Medications   Allergies as of 12/16/2017   No Known Allergies     Medication List    STOP taking these medications   aspirin 325 MG tablet     TAKE these medications   amiodarone 200 MG tablet Commonly known as:  PACERONE Take 1 tablet (200 mg total) by mouth daily. Start taking on:  12/17/2017   atorvastatin 40 MG tablet Commonly known as:  LIPITOR Take 1 tablet (40 mg total) by mouth daily at 6 PM.   calcium gluconate 500 MG tablet Take 500 mg by mouth daily.   cholecalciferol 1000 units tablet Commonly known as:  VITAMIN D Take 1,000 Units by mouth daily.   furosemide 20 MG tablet Commonly known as:  LASIX Take 1 tablet (20 mg total) by mouth daily. Start taking on:  12/17/2017   loratadine 10 MG tablet Commonly known as:  CLARITIN Take 10 mg by mouth daily as needed for allergies.   losartan 25 MG tablet Commonly known as:  COZAAR Take 0.5 tablets (12.5 mg total) by mouth at bedtime.   spironolactone 25 MG tablet Commonly known as:  ALDACTONE Take 0.5 tablets (12.5 mg total) by mouth daily. Start taking on:  12/17/2017        Aspirin prescribed at discharge?  Yes High Intensity Statin Prescribed? (Lipitor 40-80mg  or Crestor 20-40mg ): Yes Beta Blocker Prescribed? BP too low - add as outpatient For EF 45% or less, Was ACEI/ARB Prescribed? Yes ADP Receptor Inhibitor Prescribed? (i.e. Plavix etc.-Includes Medically Managed Patients): Yes For EF <40%, Aldosterone Inhibitor Prescribed? Yes Was EF assessed during THIS hospitalization?  Yes Was Cardiac Rehab II ordered? (Included Medically managed Patients): Yes   Outstanding Labs/Studies   BEMT at follow up Panel and LFT in 6-week  Duration of Discharge Encounter   Greater than 30 minutes including physician time.  Signed, Bhavinkumar Bhagat PA-C 12/16/2017, 10:08 AM

## 2017-12-21 ENCOUNTER — Telehealth (HOSPITAL_COMMUNITY): Payer: Self-pay

## 2017-12-21 NOTE — Telephone Encounter (Signed)
Patients insurance is active and benefits verified through [place] - $0.00 co-pay, deductible amount of $350.00/$350.00 has been met, out of pocket amount of $6,000/$534.14 has been met, 10% co-insurance, and no pre-authorization is required. Passport/reference [phone]  Will contact patient to see if she is interested in the Cardiac Rehab Program. If interested, patient will be contacted for scheduling upon review by the RN Navigator.

## 2017-12-21 NOTE — Telephone Encounter (Signed)
Called patient to see if she is interested in the Cardiac Rehab Program - Patient stated she is unsure right now. Explained the scheduling process. Will call patient once reviewed by RN Navigator.

## 2017-12-24 NOTE — Progress Notes (Signed)
Advanced Heart Failure Clinic Note   Referring Physician: PCP: [name], NP (Inactive) PCP-Cardiologist: [name], MD   HPI: Savannah Bell is a 67 y.o. female with newly diagnosed systolic HF and frequent PVCs.   Admitted 5/28-12/16/17 with acute systolic HF. Echo showed EF 15%. R/LHC completed, which showed normal coronaries and normal LV pressures. Cardiac MRI showed EF 17% with no LGE. HF team consulted. She was noted to have frequent PVC's on tele. Loaded with IV amiodarone, then transitioned to PO. She was diuresed with IV lasix, then transitioned to lasix 20 mg daily. HF medications optimized. DC weight: 145 lbs.   Today she presents for post hospital follow up. Overall doing well. Her only concern is SOB and chest flutters that occurs prior to getting into bed. She says it feels like a panic attack and happens about every other night and resolves with a few deep breaths. She does not know why she feels anxious prior to bed. She does not have SOB with stairs or walking. She is going to start cardiac rehab soon. Denies orthopnea, PND, or edema. Denies dizziness or palpitations (other than fluttering that occurs prior to bed). Limiting fluid and salt intake. Taking all medications. SBP 90-100s at home. Weights stable at home 137-139 lbs.   Studies:  Echo 5/29: EF 15% with grade 1 DD, no LV thrombus, mild MR, trivial TR, PA peak pressure 31 mmHg, and trivial pericardial effusion.   R/LHC 5/29: that showed normal coronary arteries, EF 10-20%, normal LV filling pressures (no right sided pressures recorded).   Cardiac MRI 12/13/17: EF 17%, findings consistent with NICM with no evidence ofLGE to suggestinflammatory or infiltrative cardiomyopathy.   Review of systems complete and found to be negative unless listed in HPI.   Past Medical History:  Diagnosis Date  . History of blood transfusion    "when I had colon resection" (12/11/2017)  . Migraine    "stopped in the  1970s" (12/11/2017)  . Mitral valve prolapse     Current Outpatient Medications  Medication Sig Dispense Refill  . amiodarone (PACERONE) 200 MG tablet Take 1 tablet (200 mg total) by mouth daily. 60 tablet 2  . atorvastatin (LIPITOR) 40 MG tablet Take 1 tablet (40 mg total) by mouth daily at 6 PM. 30 tablet 6  . calcium gluconate 500 MG tablet Take 500 mg by mouth daily.    . cholecalciferol (VITAMIN D) 1000 UNITS tablet Take 1,000 Units by mouth daily.    . furosemide (LASIX) 20 MG tablet Take 1 tablet (20 mg total) by mouth daily. 30 tablet 6  . loratadine (CLARITIN) 10 MG tablet Take 10 mg by mouth daily as needed for allergies.    Marland Kitchen losartan (COZAAR) 25 MG tablet Take 0.5 tablets (12.5 mg total) by mouth at bedtime. 30 tablet 6  . spironolactone (ALDACTONE) 25 MG tablet Take 0.5 tablets (12.5 mg total) by mouth daily. 30 tablet 6   No current facility-administered medications for this encounter.     No Known Allergies    Social History   Socioeconomic History  . Marital status: Divorced    Spouse name: Not on file  . Number of children: Not on file  . Years of education: Not on file  . Highest education level: Not on file  Occupational History  . Not on file  Social Needs  . Financial resource strain: Not on file  . Food insecurity:    Worry: Not on file    Inability: Not  on file  . Transportation needs:    Medical: Not on file    Non-medical: Not on file  Tobacco Use  . Smoking status: Former Smoker    Packs/day: 0.30    Years: 10.00    Pack years: 3.00    Types: Cigarettes    Last attempt to quit: 1982    Years since quitting: 37.4  . Smokeless tobacco: Former Neurosurgeon    Types: Snuff, Chew    Quit date: 2000  Substance and Sexual Activity  . Alcohol use: Not Currently  . Drug use: Never  . Sexual activity: Not Currently  Lifestyle  . Physical activity:    Days per week: Not on file    Minutes per session: Not on file  . Stress: Not on file  Relationships    . Social connections:    Talks on phone: Not on file    Gets together: Not on file    Attends religious service: Not on file    Active member of club or organization: Not on file    Attends meetings of clubs or organizations: Not on file    Relationship status: Not on file  . Intimate partner violence:    Fear of current or ex partner: Not on file    Emotionally abused: Not on file    Physically abused: Not on file    Forced sexual activity: Not on file  Other Topics Concern  . Not on file  Social History Narrative  . Not on file      Family History  Problem Relation Age of Onset  . Sudden Cardiac Death Mother     Vitals:   16-Jan-2018 1126  BP: 100/66  Pulse: 81  SpO2: 99%  Weight: 142 lb 4 oz (64.5 kg)   Wt Readings from Last 3 Encounters:  01-16-18 142 lb 4 oz (64.5 kg)  12/16/17 145 lb 14.4 oz (66.2 kg)  11/27/11 145 lb 2 oz (65.8 kg)     PHYSICAL EXAM: General:  Well appearing. No respiratory difficulty HEENT: normal Neck: supple. no JVD. Carotids 2+ bilat; no bruits. No lymphadenopathy or thyromegaly appreciated. Cor: PMI nondisplaced. Regular rate & rhythm. No rubs, gallops or murmurs. Lungs: clear Abdomen: soft, nontender, nondistended. No hepatosplenomegaly. No bruits or masses. Good bowel sounds. Extremities: no cyanosis, clubbing, rash, edema Neuro: alert & oriented x 3, cranial nerves grossly intact. moves all 4 extremities w/o difficulty. Affect pleasant.  ECG: NSR, 72 bpm with incomplete LBBB, QRS 112 ms. No PVCs.    ASSESSMENT & PLAN:  1. New systolic HF due to NICM. Normal coronaries on [facility] 12/11/17. cMRI shows no infiltrative or inflammatory CM.No family hx of HF. No ETOH. No hx of HTN. No recent virus. She has frequent PVCs (~20%), which may be causing her HF. She does not snore but thinks she goes apneic. -Echo 5/29 EF 15% with grade 1 DD, no LV thrombus, mild MR, trivial TR, PA peak pressure 31 mmHg, and trivial pericardial effusion.  -  Volume status stable on exam.  - Continue lasix 20 mg daily. BMET today.  -Continue spiro 12.5 mg daily - Continue losartan 12.5 mg qHS. No BP room to increase HF meds.  - No BB with low BP. SBP 90s at home. Hold off for now. Consider starting at next visit if BP permits.  - She plans to start cardiac rehab soon - Repeat echo in 3-4 months. She will need EP referral for ICD consideration if EF remains </=  35%.  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 Provided work note to keep her out of work at least until repeat echo.  - FMLA paperwork received - Refer for sleep study  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 Check AST/ALT and TSH today. - We discussed the need for annual eye exams while on amiodarone.  - EKG today shows NSR with no PVCs  3. Hyperlipidemia - Continue statin  4. Snoring - Refer for sleep study  5. ?Anxiety attack  - Pt describes as an anxiety attack with SOB and chest fluttering, but she is not sure why she has these prior to getting into bed. Resolved with taking a few deep breaths. She does not have orthopnea and says the SOB is different from when she had volume overload.  - She denies SOB and palpitations at any other time, so I do not think this is cardiac-related - She should follow up with PCP if this does not improve.   Refer for sleep study CMET, TSH today Follow up in 4 weeks Schedule echo with Dr [name] in 3 months  Alford Highland, NP 12/25/17

## 2017-12-25 ENCOUNTER — Ambulatory Visit (HOSPITAL_COMMUNITY)
Admit: 2017-12-25 | Discharge: 2017-12-25 | Disposition: A | Discharge status: Home / Self Care | Payer: No Typology Code available for payment source | Attending: Cardiology | Admitting: Cardiology

## 2017-12-25 VITALS — BP 100/66 | HR 81 | Wt 142.2 lb

## 2017-12-25 DIAGNOSIS — R0683 Snoring: Secondary | ICD-10-CM | POA: Insufficient documentation

## 2017-12-25 DIAGNOSIS — F419 Anxiety disorder, unspecified: Secondary | ICD-10-CM

## 2017-12-25 DIAGNOSIS — I4581 Long QT syndrome: Secondary | ICD-10-CM | POA: Insufficient documentation

## 2017-12-25 DIAGNOSIS — R5383 Other fatigue: Secondary | ICD-10-CM

## 2017-12-25 DIAGNOSIS — I341 Nonrheumatic mitral (valve) prolapse: Secondary | ICD-10-CM | POA: Diagnosis not present

## 2017-12-25 DIAGNOSIS — E785 Hyperlipidemia, unspecified: Secondary | ICD-10-CM | POA: Diagnosis not present

## 2017-12-25 DIAGNOSIS — Z87891 Personal history of nicotine dependence: Secondary | ICD-10-CM | POA: Diagnosis not present

## 2017-12-25 DIAGNOSIS — Z79899 Other long term (current) drug therapy: Secondary | ICD-10-CM | POA: Diagnosis not present

## 2017-12-25 DIAGNOSIS — I429 Cardiomyopathy, unspecified: Secondary | ICD-10-CM | POA: Insufficient documentation

## 2017-12-25 DIAGNOSIS — I5023 Acute on chronic systolic (congestive) heart failure: Secondary | ICD-10-CM

## 2017-12-25 DIAGNOSIS — I493 Ventricular premature depolarization: Secondary | ICD-10-CM | POA: Diagnosis not present

## 2017-12-25 DIAGNOSIS — I5022 Chronic systolic (congestive) heart failure: Secondary | ICD-10-CM

## 2017-12-25 LAB — COMPREHENSIVE METABOLIC PANEL
ALT: 21 U/L (ref 14–54)
AST: 23 U/L (ref 15–41)
Albumin: 4.1 g/dL (ref 3.5–5.0)
Alkaline Phosphatase: 70 U/L (ref 38–126)
Anion gap: 11 (ref 5–15)
BUN: 14 mg/dL (ref 6–20)
CO2: 25 mmol/L (ref 22–32)
Calcium: 9.3 mg/dL (ref 8.9–10.3)
Chloride: 103 mmol/L (ref 101–111)
Creatinine, Ser: 1.28 mg/dL — ABNORMAL HIGH (ref 0.44–1.00)
GFR calc Af Amer: 49 mL/min — ABNORMAL LOW (ref >60)
GFR calc non Af Amer: 43 mL/min — ABNORMAL LOW (ref >60)
Glucose, Bld: 90 mg/dL (ref 65–99)
Potassium: 4.1 mmol/L (ref 3.5–5.1)
Sodium: 139 mmol/L (ref 135–145)
Total Bilirubin: 0.7 mg/dL (ref 0.3–1.2)
Total Protein: 6.9 g/dL (ref 6.5–8.1)

## 2017-12-25 LAB — TSH: TSH: 1.24 u[IU]/mL (ref 0.350–4.500)

## 2017-12-25 NOTE — Patient Instructions (Signed)
Routine lab work today. Will notify you of abnormal results, otherwise no news is good news!  No changes to medication at this time.  Will schedule you for sleep study at [facility] Address: [address] [place], [place], [place] [address] Phone: [phone] Their office will call you to schedule.  Follow up 4 weeks with Duwaine Maxin NP-C.  _____________________________________________________________ [name] Code: 1400  Follow up 3 months with Dr. [name] and echocardiogram.  _____________________________________________________________ [name] Code: 1600  Take all medication as prescribed the day of your appointment. Bring all medications with you to your appointment.  Do the following things EVERYDAY: 1) Weigh yourself in the morning before breakfast. Write it down and keep it in a log. 2) Take your medicines as prescribed 3) Eat low salt foods-Limit salt (sodium) to 2000 mg per day.  4) Stay as active as you can everyday 5) Limit all fluids for the day to less than 2 liters

## 2017-12-26 ENCOUNTER — Encounter (HOSPITAL_COMMUNITY): Payer: Self-pay | Admitting: *Deleted

## 2017-12-26 NOTE — Progress Notes (Signed)
Received FMLA paperwork for patient on 12/25/2017.  Forms completed/signed and faxed today to [facility] @ [phone].  Original forms will be scanned to patients electronic medical record.

## 2018-01-03 ENCOUNTER — Telehealth (HOSPITAL_COMMUNITY): Payer: Self-pay

## 2018-01-03 NOTE — Telephone Encounter (Signed)
Called pt regarding CR referral, pt stated that she would like to hold off on scheduling right now. She wasn't clear of when she will be returning to work. Will f/u with pt.

## 2018-01-16 ENCOUNTER — Telehealth (HOSPITAL_COMMUNITY): Payer: Self-pay

## 2018-01-16 NOTE — Telephone Encounter (Signed)
Called to follow up on patient to see if she knew when she will be returning to work and would like to schedule for CR. Patient stated she still is not sure when she will be returning to work and would like to hold off for now. Patient stated again she will follow up with [place] once she knows more.  Closed referral

## 2018-01-21 NOTE — Progress Notes (Signed)
Advanced Heart Failure Clinic Note   Referring Physician: PCP: [name], NP (Inactive) PCP-Cardiologist: [name], MD   HPI: Savannah Bell is a 67 y.o. female with newly diagnosed systolic HF and frequent PVCs.   Admitted 5/28-12/16/17 with acute systolic HF. Echo showed EF 15%. R/LHC completed, which showed normal coronaries and normal LV pressures. Cardiac MRI showed EF 17% with no LGE. HF team consulted. She was noted to have frequent PVC's on tele. Loaded with IV amiodarone, then transitioned to PO. She was diuresed with IV lasix, then transitioned to lasix 20 mg daily. HF medications optimized. DC weight: 145 lbs.   Today she presents for regular HF follow up. Overall doing well. Denies SOB, orthopnea, PND, or edema. Active around the house and goes up and down steps with no problems. No formal exercise. Denies CP or dizziness. Appetite and energy are good. She has had a nonproductive cough for 2 weeks. No fever or chills. She is nervous that the cough is a sign that her HF is worse. She has not had any more panic attacks. Weights have trended down 138 >133 lbs at home. She is limiting her fluid and salt. Taking all medications. She has heard from cardiac rehab, but has not yet started.   Studies:  Echo 5/29: EF 15% with grade 1 DD, no LV thrombus, mild MR, trivial TR, PA peak pressure 31 mmHg, and trivial pericardial effusion.   R/LHC 5/29: that showed normal coronary arteries, EF 10-20%, normal LV filling pressures (no right sided pressures recorded).   Cardiac MRI 12/13/17: EF 17%, findings consistent with NICM with no evidence ofLGE to suggestinflammatory or infiltrative cardiomyopathy.   Review of systems complete and found to be negative unless listed in HPI.   Past Medical History:  Diagnosis Date  . History of blood transfusion    "when I had colon resection" (12/11/2017)  . Migraine    "stopped in the 1970s" (12/11/2017)  . Mitral valve prolapse      Current Outpatient Medications  Medication Sig Dispense Refill  . amiodarone (PACERONE) 200 MG tablet Take 1 tablet (200 mg total) by mouth daily. 60 tablet 2  . atorvastatin (LIPITOR) 40 MG tablet Take 1 tablet (40 mg total) by mouth daily at 6 PM. 30 tablet 6  . calcium gluconate 500 MG tablet Take 500 mg by mouth daily.    . cholecalciferol (VITAMIN D) 1000 UNITS tablet Take 1,000 Units by mouth daily.    . furosemide (LASIX) 20 MG tablet Take 1 tablet (20 mg total) by mouth daily. 30 tablet 6  . loratadine (CLARITIN) 10 MG tablet Take 10 mg by mouth daily as needed for allergies.    Marland Kitchen losartan (COZAAR) 25 MG tablet Take 0.5 tablets (12.5 mg total) by mouth at bedtime. 30 tablet 6  . spironolactone (ALDACTONE) 25 MG tablet Take 0.5 tablets (12.5 mg total) by mouth daily. 30 tablet 6   No current facility-administered medications for this encounter.     No Known Allergies    Social History   Socioeconomic History  . Marital status: Divorced    Spouse name: Not on file  . Number of children: Not on file  . Years of education: Not on file  . Highest education level: Not on file  Occupational History  . Not on file  Social Needs  . Financial resource strain: Not on file  . Food insecurity:    Worry: Not on file    Inability: Not on file  .  Transportation needs:    Medical: Not on file    Non-medical: Not on file  Tobacco Use  . Smoking status: Former Smoker    Packs/day: 0.30    Years: 10.00    Pack years: 3.00    Types: Cigarettes    Last attempt to quit: 1982    Years since quitting: 37.5  . Smokeless tobacco: Former Neurosurgeon    Types: Snuff, Chew    Quit date: 2000  Substance and Sexual Activity  . Alcohol use: Not Currently  . Drug use: Never  . Sexual activity: Not Currently  Lifestyle  . Physical activity:    Days per week: Not on file    Minutes per session: Not on file  . Stress: Not on file  Relationships  . Social connections:    Talks on phone: Not  on file    Gets together: Not on file    Attends religious service: Not on file    Active member of club or organization: Not on file    Attends meetings of clubs or organizations: Not on file    Relationship status: Not on file  . Intimate partner violence:    Fear of current or ex partner: Not on file    Emotionally abused: Not on file    Physically abused: Not on file    Forced sexual activity: Not on file  Other Topics Concern  . Not on file  Social History Narrative  . Not on file      Family History  Problem Relation Age of Onset  . Sudden Cardiac Death Mother     Vitals:   01-23-18 1349  BP: 108/70  Pulse: 72  SpO2: 99%  Weight: 141 lb 6.4 oz (64.1 kg)   Wt Readings from Last 3 Encounters:  Jan 23, 2018 141 lb 6.4 oz (64.1 kg)  12/25/17 142 lb 4 oz (64.5 kg)  12/16/17 145 lb 14.4 oz (66.2 kg)     PHYSICAL EXAM: General: Well appearing. No resp difficulty. HEENT: Normal Neck: Supple. JVP 5-6. Carotids 2+ bilat; no bruits. No thyromegaly or nodule noted. Cor: PMI nondisplaced. RRR, No M/G/R noted Lungs: CTAB, normal effort. Abdomen: Soft, non-tender, non-distended, no HSM. No bruits or masses. +BS  Extremities: No cyanosis, clubbing, or rash. R and LLE no edema.  Neuro: Alert & orientedx3, cranial nerves grossly intact. moves all 4 extremities w/o difficulty. Affect pleasant  ASSESSMENT & PLAN:  1. Chronic systolic HF due to NICM. Normal coronaries on [facility] 12/11/17. cMRI shows no infiltrative or inflammatory CM.No family hx of HF. No ETOH. No hx of HTN. No recent virus. She had frequent PVCs (~20%) while inpatient, which may be causing her HF. She does not snore but thinks she goes apneic. -Echo 12/12/17 EF 15% with grade 1 DD, no LV thrombus, mild MR, trivial TR, PA peak pressure 31 mmHg, and trivial pericardial effusion.  - NYHA class I-II - Volume status stable on exam and on clip (33%) - Continue lasix 20 mg daily. BMET today.  -Continue spiro 12.5 mg  daily - Continue losartan 12.5 mg qHS.  - Start coreg 3.125 mg BID  - Encouraged her to go to cardiac rehab.  - Repeat echo scheduled for September. She will need EP referral for ICD consideration if EF remains </= 35%.  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 She was provided work note last visit to keep her  out of work at least until repeat echo.  - FMLA paperwork received last visit. She got a letter that said this wasn't received. We received additional FMLA paperwork today.   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 AST/ALT and TSH normal 12/25/17 - We discussed the need for annual eye exams while on amiodarone.  - Regular on exam. Denies palpitations. - May need Holter monitor to quantify PVCs if EF is not improved on echo.   3. Hyperlipidemia - Continue statin  4. Snoring - Referred for sleep study. Re-referred today.   5. ?Anxiety attack  - She has not had any further anxiety attacks. Encouraged her to follow up with PCP if reoccurs.   BMET today  Start coreg 3.125 mg BID Follow up in 4 weeks with pharmacy for further med titration Keep echo with Dr [name] in September  Alford Highland, NP 01/22/18  Greater than 50% of the 25 minute visit was spent in counseling/coordination of care regarding disease state education, salt/fluid restriction, sliding scale diuretics, and medication compliance.

## 2018-01-22 ENCOUNTER — Encounter (HOSPITAL_COMMUNITY): Payer: Self-pay

## 2018-01-22 ENCOUNTER — Ambulatory Visit (HOSPITAL_COMMUNITY)
Admission: RE | Admit: 2018-01-22 | Discharge: 2018-01-22 | Disposition: A | Discharge status: Home / Self Care | Payer: 59 | Source: Ambulatory Visit | Attending: Cardiology | Admitting: Cardiology

## 2018-01-22 VITALS — BP 108/70 | HR 72 | Wt 141.4 lb

## 2018-01-22 DIAGNOSIS — I493 Ventricular premature depolarization: Secondary | ICD-10-CM | POA: Insufficient documentation

## 2018-01-22 DIAGNOSIS — Z79899 Other long term (current) drug therapy: Secondary | ICD-10-CM | POA: Diagnosis not present

## 2018-01-22 DIAGNOSIS — I429 Cardiomyopathy, unspecified: Secondary | ICD-10-CM | POA: Diagnosis not present

## 2018-01-22 DIAGNOSIS — Z09 Encounter for follow-up examination after completed treatment for conditions other than malignant neoplasm: Secondary | ICD-10-CM | POA: Insufficient documentation

## 2018-01-22 DIAGNOSIS — Z79811 Long term (current) use of aromatase inhibitors: Secondary | ICD-10-CM | POA: Insufficient documentation

## 2018-01-22 DIAGNOSIS — E785 Hyperlipidemia, unspecified: Secondary | ICD-10-CM | POA: Insufficient documentation

## 2018-01-22 DIAGNOSIS — R5383 Other fatigue: Secondary | ICD-10-CM

## 2018-01-22 DIAGNOSIS — I5022 Chronic systolic (congestive) heart failure: Secondary | ICD-10-CM | POA: Insufficient documentation

## 2018-01-22 DIAGNOSIS — R0683 Snoring: Secondary | ICD-10-CM | POA: Diagnosis not present

## 2018-01-22 DIAGNOSIS — F419 Anxiety disorder, unspecified: Secondary | ICD-10-CM

## 2018-01-22 LAB — BASIC METABOLIC PANEL
Anion gap: 9 (ref 5–15)
BUN: 15 mg/dL (ref 8–23)
CO2: 28 mmol/L (ref 22–32)
Calcium: 9.5 mg/dL (ref 8.9–10.3)
Chloride: 99 mmol/L (ref 98–111)
Creatinine, Ser: 1.39 mg/dL — ABNORMAL HIGH (ref 0.44–1.00)
GFR calc Af Amer: 45 mL/min — ABNORMAL LOW (ref >60)
GFR calc non Af Amer: 39 mL/min — ABNORMAL LOW (ref >60)
Glucose, Bld: 91 mg/dL (ref 70–99)
Potassium: 4.4 mmol/L (ref 3.5–5.1)
Sodium: 136 mmol/L (ref 135–145)

## 2018-01-22 MED ORDER — CARVEDILOL 3.125 MG PO TABS: 3.1250 mg | ORAL_TABLET | Freq: Two times a day (BID) | ORAL | 3 refills | Status: DC

## 2018-01-22 NOTE — Patient Instructions (Signed)
Routine lab work today. Will notify you of abnormal results, otherwise no news is good news!  START Carvedilol (Coreg) 3.125 mg tablet twice daily.  Will schedule you for sleep study at [facility]. Address: [address] [place], [place], [place] [address] Phone: [phone] Their office will call you to schedule. If you have not heard anything in 1 week, contact their office at above listed phone number to follow up on referral.  Follow up 4 weeks.  ________________________________________________________________ [name] Code: 1500  Take all medication as prescribed the day of your appointment. Bring all medications with you to your appointment.  Do the following things EVERYDAY: 1) Weigh yourself in the morning before breakfast. Write it down and keep it in a log. 2) Take your medicines as prescribed 3) Eat low salt foods-Limit salt (sodium) to 2000 mg per day.  4) Stay as active as you can everyday 5) Limit all fluids for the day to less than 2 liters

## 2018-01-24 ENCOUNTER — Telehealth (HOSPITAL_COMMUNITY): Payer: Self-pay | Admitting: *Deleted

## 2018-01-24 DIAGNOSIS — I5022 Chronic systolic (congestive) heart failure: Secondary | ICD-10-CM

## 2018-01-24 NOTE — Telephone Encounter (Signed)
Result Notes for Basic metabolic panel   Notes recorded by Georgina Peer, RN on 01/24/2018 at 11:37 AM EDT Patient called back and she is agreeable with plan, bmet ordered and lab appt scheduled. ------  Notes recorded by Georgina Peer, RN on 01/23/2018 at 1:37 PM EDT Called and left message asking patient to call us back. ------  Notes recorded by Georgina Peer, RN on 01/22/2018 at 4:39 PM EDT Called and unable to leave VM, will continue to try and reach patient. ------  Notes recorded by Alford Highland, NP on 01/22/2018 at 4:06 PM EDT Creatinine is up a little bit. Have her hold two doses of lasix and recheck BMET in 1 week. Thanks.

## 2018-02-01 ENCOUNTER — Ambulatory Visit (HOSPITAL_COMMUNITY)
Admission: RE | Admit: 2018-02-01 | Discharge: 2018-02-01 | Disposition: A | Discharge status: Home / Self Care | Payer: 59 | Source: Ambulatory Visit | Attending: Cardiology | Admitting: Cardiology

## 2018-02-01 ENCOUNTER — Encounter (HOSPITAL_COMMUNITY): Payer: Self-pay | Admitting: *Deleted

## 2018-02-01 DIAGNOSIS — I5022 Chronic systolic (congestive) heart failure: Secondary | ICD-10-CM | POA: Insufficient documentation

## 2018-02-01 LAB — BASIC METABOLIC PANEL
Anion gap: 9 (ref 5–15)
BUN: 13 mg/dL (ref 8–23)
CO2: 29 mmol/L (ref 22–32)
Calcium: 9.2 mg/dL (ref 8.9–10.3)
Chloride: 102 mmol/L (ref 98–111)
Creatinine, Ser: 1.31 mg/dL — ABNORMAL HIGH (ref 0.44–1.00)
GFR calc Af Amer: 48 mL/min — ABNORMAL LOW (ref >60)
GFR calc non Af Amer: 41 mL/min — ABNORMAL LOW (ref >60)
Glucose, Bld: 109 mg/dL — ABNORMAL HIGH (ref 70–99)
Potassium: 4.6 mmol/L (ref 3.5–5.1)
Sodium: 140 mmol/L (ref 135–145)

## 2018-02-01 NOTE — Progress Notes (Signed)
Received FMLA paperwork from patient on January 22, 2018. Paperwork completed and faxed today to [phone].  Patient is aware.  Completed forms will be scanned to patient's electronic medical record.

## 2018-02-25 ENCOUNTER — Ambulatory Visit (HOSPITAL_COMMUNITY)
Admission: RE | Admit: 2018-02-25 | Discharge: 2018-02-25 | Disposition: A | Discharge status: Home / Self Care | Payer: 59 | Source: Ambulatory Visit | Attending: Cardiology | Admitting: Cardiology

## 2018-02-25 DIAGNOSIS — R0683 Snoring: Secondary | ICD-10-CM | POA: Diagnosis not present

## 2018-02-25 DIAGNOSIS — I493 Ventricular premature depolarization: Secondary | ICD-10-CM | POA: Diagnosis not present

## 2018-02-25 DIAGNOSIS — E785 Hyperlipidemia, unspecified: Secondary | ICD-10-CM | POA: Diagnosis not present

## 2018-02-25 DIAGNOSIS — I5022 Chronic systolic (congestive) heart failure: Secondary | ICD-10-CM | POA: Insufficient documentation

## 2018-02-25 MED ORDER — CARVEDILOL 6.25 MG PO TABS: 6.2500 mg | ORAL_TABLET | Freq: Two times a day (BID) | ORAL | 5 refills | Status: DC

## 2018-02-25 NOTE — Progress Notes (Signed)
HF MD: [name]  HPI:  Savannah Bell is a 67 y.o. female with newly diagnosed systolic HF and frequent PVCs.   Admitted 5/28-12/16/17 with acute systolic HF. Echo showed EF 15%. R/LHC completed, which showed normal coronaries and normal LV pressures. Cardiac MRI showed EF 17% with no LGE. HF team consulted. She was noted to have frequent PVC's on tele. Loaded with IV amiodarone, then transitioned to PO. She was diuresed with IV lasix, then transitioned to lasix 20 mg daily. HF medications optimized. DC weight: 145 lbs.   Today she presents for pharmacist-led HF medication titration. At last HF clinic visit on 01/22/18, carvedilol 3.125 mg BID was started. Overall doing well. Denies SOB, orthopnea, PND, or edema. Active around the house and goes up and down steps with no problems. Appetite and energy are good. Taking all medications. She has heard from cardiac rehab, but has not yet started d/t scheduling conflicts.      . Shortness of breath/dyspnea on exertion? no  . Orthopnea/PND? no . Edema? no . Lightheadedness/dizziness? no . Daily weights at home? Yes - stable 137-138 lb . Blood pressure/heart rate monitoring at home? Yes - 92-113 mmHg . Following low-sodium/fluid-restricted diet? Yes  HF Medications: Carvedilol 3.125 mg PO BID Furosemide 20 mg PO daily Losartan 12.5 mg PO QHS Spironolactone 12.5 mg PO daily  Has the patient been experiencing any side effects to the medications prescribed?  no  Does the patient have any problems obtaining medications due to transportation or finances?   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 Patient understands to avoid decongestants.    Pertinent Lab Values: . 02/01/18: Serum creatinine 1.31 (BL ~1.2-1.4), BUN 13, Potassium 4.6, Sodium 140  Vital Signs: . Weight: 142.6 lb (dry weight: 141 lb) . Blood pressure: 108/80  mmHg (93-108) . Heart rate: 62 bpm (68-81) . CLIP 32%   Assessment: 1. Chronicsystolic CHF (EF 16%), due to NICM (?frequent PVCs). NYHA class I-IIsymptoms.  - Volume status stable   - With lower BP readings at home, will increase carvedilol cautiously to 6.25 mg BID  - Continue furosemide 20 mg daily, losartan 12.5 mg daily and spironolactone 12.5 mg daily   - Repeat echo scheduled for September. She will need EP referral for ICD consideration if EF remains </= 35%.  - Basic disease state pathophysiology, medication indication, mechanism and side effects reviewed at length with patient and she verbalized understanding  2. Frequent PVCs -~20%burdenon tele during recent admit, Most likely cause of her NICM -Continueamiodarone 200 mg daily. AST/ALT and TSH normal 12/25/17 - May need Holter monitor to quantify PVCs if EF is not improved on echo.   3. Hyperlipidemia - Last lipid panel on 12/12/17 - LDL 100, TG 45, HDL 81 - Continue atorvastatin 40 mg daily   4. Snoring - Referred for sleep study. Re-referred at last visit.   5. ?Anxiety attack  - She has not had any further anxiety attacks. Encouraged her to follow up with PCP if reoccurs.   Plan: 1) Medication changes: Based on clinical presentation, vital signs and recent labs will increase carvedilol to 6.25 mg BID 2) Labs: PRN 3) Follow-up: Dr. [name] with ECHO on 03/27/18   Savannah Bell. Savannah Bell, PharmD, BCPS, CPP Clinical Pharmacist Pager: [phone] Phone: [phone] 02/25/2018 10:59 AM

## 2018-02-25 NOTE — Patient Instructions (Addendum)
It was great to see you today!  Please INCREASE carvedilol to 6.25 mg TWICE DAILY. You may take your current carvedilol 3.125 mg tablets - #2 tablets TWICE DAILY until you pick up the higher dosage.   Please keep your appointment with Dr. [name] on Wednesday 9/11.

## 2018-02-28 ENCOUNTER — Other Ambulatory Visit: Payer: Self-pay | Admitting: Physician Assistant

## 2018-03-01 ENCOUNTER — Telehealth (HOSPITAL_COMMUNITY): Payer: Self-pay

## 2018-03-01 NOTE — Telephone Encounter (Signed)
Patient called to schedule Cardiac Rehab as she worked out her work schedule. Scheduled orientation on 04/09/18 at 1:30pm. Patient will attend the 1:15pm exc class. Mailed packet.

## 2018-03-04 ENCOUNTER — Telehealth (HOSPITAL_COMMUNITY): Payer: Self-pay | Admitting: Pharmacist

## 2018-03-04 NOTE — Telephone Encounter (Signed)
Savannah Bell called stating that since increasing her carvedilol last week, she feels that the beat of her heart is "more intense" and she's feeling a flutter every once in a while. She is currently being treated for a cough that her PCP is attributing to allergies. She would like to reduce her dose back to 3.125 mg BID for a few days to see if these symptoms improve. She will call me back either way.   Tyler Deis. Bonnye Fava, PharmD, BCPS, CPP Clinical Pharmacist Phone: [phone] 03/04/2018 2:37 PM

## 2018-03-11 ENCOUNTER — Other Ambulatory Visit: Payer: Self-pay | Admitting: Physician Assistant

## 2018-03-14 ENCOUNTER — Encounter (HOSPITAL_COMMUNITY): Payer: Self-pay | Admitting: *Deleted

## 2018-03-14 ENCOUNTER — Telehealth (HOSPITAL_COMMUNITY): Payer: Self-pay | Admitting: *Deleted

## 2018-03-14 NOTE — Telephone Encounter (Signed)
Pt called b/c her FMLA forms expire tomorrow and she is not sure if she is supposed to go back to work or not.  Pt has ef of 15% and has been out of work, she is sch for f/u echo and appt on 9/11.  Letter completed and faxed to her job at [phone] stating she is to remain out of work until she is re-evaluated at her upcoming appt 9/11

## 2018-03-27 ENCOUNTER — Ambulatory Visit (HOSPITAL_BASED_OUTPATIENT_CLINIC_OR_DEPARTMENT_OTHER)
Admission: RE | Admit: 2018-03-27 | Discharge: 2018-03-27 | Disposition: A | Discharge status: Home / Self Care | Payer: 59 | Source: Ambulatory Visit | Attending: Internal Medicine | Admitting: Internal Medicine

## 2018-03-27 ENCOUNTER — Ambulatory Visit (HOSPITAL_COMMUNITY)
Admission: RE | Admit: 2018-03-27 | Discharge: 2018-03-27 | Disposition: A | Discharge status: Home / Self Care | Payer: 59 | Source: Ambulatory Visit | Attending: Physician Assistant | Admitting: Physician Assistant

## 2018-03-27 ENCOUNTER — Ambulatory Visit (HOSPITAL_COMMUNITY)
Admission: RE | Admit: 2018-03-27 | Discharge: 2018-03-27 | Disposition: A | Discharge status: Home / Self Care | Payer: 59 | Source: Ambulatory Visit | Attending: Internal Medicine | Admitting: Internal Medicine

## 2018-03-27 ENCOUNTER — Other Ambulatory Visit: Payer: Self-pay

## 2018-03-27 VITALS — BP 111/59 | HR 59 | Wt 141.5 lb

## 2018-03-27 DIAGNOSIS — I340 Nonrheumatic mitral (valve) insufficiency: Secondary | ICD-10-CM | POA: Diagnosis not present

## 2018-03-27 DIAGNOSIS — I5022 Chronic systolic (congestive) heart failure: Secondary | ICD-10-CM

## 2018-03-27 DIAGNOSIS — F419 Anxiety disorder, unspecified: Secondary | ICD-10-CM | POA: Diagnosis not present

## 2018-03-27 DIAGNOSIS — E785 Hyperlipidemia, unspecified: Secondary | ICD-10-CM | POA: Diagnosis not present

## 2018-03-27 DIAGNOSIS — I252 Old myocardial infarction: Secondary | ICD-10-CM | POA: Diagnosis not present

## 2018-03-27 DIAGNOSIS — I493 Ventricular premature depolarization: Secondary | ICD-10-CM | POA: Diagnosis not present

## 2018-03-27 MED ORDER — LOSARTAN POTASSIUM 25 MG PO TABS: 25.0000 mg | ORAL_TABLET | Freq: Every day | ORAL | 6 refills | Status: DC

## 2018-03-27 NOTE — Progress Notes (Addendum)
  Echocardiogram 2D Echocardiogram has been performed.  Strain inaccurate   Chelsea L Androw 03/27/2018, 1:43 PM

## 2018-03-27 NOTE — Progress Notes (Signed)
Advanced Heart Failure Clinic Note   Referring Physician: PCP: [name], NP (Inactive) PCP-Cardiologist: [name], MD   HPI: Savannah Bell is a 67 y.o. female with newly diagnosed systolic HF and frequent PVCs.   Admitted 5/28-12/16/17 with acute systolic HF. Echo showed EF 15%. R/LHC completed, which showed normal coronaries and normal LV pressures. Cardiac MRI showed EF 17% with no LGE. HF team consulted. She was noted to have frequent PVC's on tele. Loaded with IV amiodarone, then transitioned to PO. She was diuresed with IV lasix, then transitioned to lasix 20 mg daily. HF medications optimized. DC weight: 145 lbs.   She presents today for regular follow up with Echo. Last visit coreg added. It was increased at pharmacy visit, but pt cut back with palpitations and malaise. She walked 3 miles the other day at a brisk pace with mild SOB, but otherwise she has no DOE. No further anxiety/panic attacks. Denies CP or lightheadedness with standing. Mild dizziness at times with rapid tuning. Taking all medications as directed. Has a chronic dry cough. Prescribed tessalon pearls and singulair with minimal relief.   Echo today shows EF remains ~ 20-25% but LV less dilated. Personally reviewed by Dr. [name]    Studies:  Echo 5/29: EF 15% with grade 1 DD, no LV thrombus, mild MR, trivial TR, PA peak pressure 31 mmHg, and trivial pericardial effusion.   R/LHC 5/29: that showed normal coronary arteries, EF 10-20%, normal LV filling pressures (no right sided pressures recorded).   Cardiac MRI 12/13/17: EF 17%, findings consistent with NICM with no evidence ofLGE to suggestinflammatory or infiltrative cardiomyopathy.   Review of systems complete and found to be negative unless listed in HPI.    Past Medical History:  Diagnosis Date  . History of blood transfusion    "when I had colon resection" (12/11/2017)  . Migraine    "stopped in the 1970s" (12/11/2017)  . Mitral valve  prolapse     Current Outpatient Medications  Medication Sig Dispense Refill  . amiodarone (PACERONE) 200 MG tablet Take 1 tablet (200 mg total) by mouth daily. 60 tablet 2  . atorvastatin (LIPITOR) 40 MG tablet TAKE 1 TABLET BY MOUTH EVERY DAY AT 6PM 90 tablet 3  . calcium gluconate 500 MG tablet Take 500 mg by mouth daily.    . carvedilol (COREG) 3.125 MG tablet Take 3.125 mg by mouth 2 (two) times daily with a meal.    . cholecalciferol (VITAMIN D) 1000 UNITS tablet Take 1,000 Units by mouth daily.    . furosemide (LASIX) 20 MG tablet Take 1 tablet (20 mg total) by mouth daily. 30 tablet 6  . loratadine (CLARITIN) 10 MG tablet Take 10 mg by mouth daily as needed for allergies.    Marland Kitchen losartan (COZAAR) 25 MG tablet Take 0.5 tablets (12.5 mg total) by mouth at bedtime. 30 tablet 6  . spironolactone (ALDACTONE) 25 MG tablet Take 0.5 tablets (12.5 mg total) by mouth daily. 30 tablet 6   No current facility-administered medications for this encounter.    No Known Allergies  Social History   Socioeconomic History  . Marital status: Divorced    Spouse name: Not on file  . Number of children: Not on file  . Years of education: Not on file  . Highest education level: Not on file  Occupational History  . Not on file  Social Needs  . Financial resource strain: Not on file  . Food insecurity:    Worry: Not  on file    Inability: Not on file  . Transportation needs:    Medical: Not on file    Non-medical: Not on file  Tobacco Use  . Smoking status: Former Smoker    Packs/day: 0.30    Years: 10.00    Pack years: 3.00    Types: Cigarettes    Last attempt to quit: 1982    Years since quitting: 37.7  . Smokeless tobacco: Former Neurosurgeon    Types: Snuff, Chew    Quit date: 2000  Substance and Sexual Activity  . Alcohol use: Not Currently  . Drug use: Never  . Sexual activity: Not Currently  Lifestyle  . Physical activity:    Days per week: Not on file    Minutes per session: Not on  file  . Stress: Not on file  Relationships  . Social connections:    Talks on phone: Not on file    Gets together: Not on file    Attends religious service: Not on file    Active member of club or organization: Not on file    Attends meetings of clubs or organizations: Not on file    Relationship status: Not on file  . Intimate partner violence:    Fear of current or ex partner: Not on file    Emotionally abused: Not on file    Physically abused: Not on file    Forced sexual activity: Not on file  Other Topics Concern  . Not on file  Social History Narrative  . Not on file    Family History  Problem Relation Age of Onset  . Sudden Cardiac Death Mother    Vitals:   2018/04/09 1334  BP: (!) 111/59  Pulse: (!) 59  SpO2: 100%  Weight: 64.2 kg (141 lb 8 oz)   Wt Readings from Last 3 Encounters:  2018/04/09 64.2 kg (141 lb 8 oz)  02/25/18 64.7 kg (142 lb 9.6 oz)  01/22/18 64.1 kg (141 lb 6.4 oz)   PHYSICAL EXAM: General: Well appearing. No resp difficulty. HEENT: Normal anicteric Neck: Supple. JVP 5-6. Carotids 2+ bilat; no bruits. No thyromegaly or nodule noted. Cor: PMI nondisplaced. RRR, No M/G/R noted Lungs: CTAB, normal effort. Abdomen: Soft, non-tender, non-distended, no HSM. No bruits or masses. +BS  Extremities: no cyanosis, clubbing, rash, edema Neuro: alert & oriented x 3, cranial nerves grossly intact. moves all 4 extremities w/o difficulty. Affect pleasant   ASSESSMENT & PLAN:  1. Chronic systolic HF due to NICM. Normal coronaries on [facility] 12/11/17. cMRI shows no infiltrative or inflammatory CM.No family hx of HF. No ETOH. No hx of HTN. No recent virus. She had frequent PVCs (~20%) while inpatient, which may be causing her HF. She does not snore but thinks she goes apneic. -Echo 12/12/17 EF 15% with grade 1 DD, no LV thrombus, mild MR, trivial TR, PA peak pressure 31 mmHg, and trivial pericardial effusion.  - NYHA I-II symptoms.  - Volume status stable on exam.    - Continue lasix 20 mg daily. BMET today.  -Continue spiro 12.5 mg daily - Increase losartan to 25 mg qhs.  - Continue coreg 3.125 mg BID. Intolerant to up-titration due to palpitations.  - Starts cardiac rehab in 2 weeks.  - EF remains low, but improving. Will continue med titration and repeat Echo in 3 months. If remains low, will need referral to EP.  - She can go back to work if there is a way for her to lift  less.   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 AST/ALT and TSH normal 12/25/17 - We discussed the need for annual eye exams while on amiodarone.  - Regular on exam.  - Will place Zio to quantify PVCs with persistent LV dysfunction.   3. Hyperlipidemia - Continue statin.   4. Snoring - Has been referred twice for sleep study. She states she has never heard back from them.   5. Anxiety - Per PCP.   RTC 4 weeks for med titration. Hold off on ICD for now.   Graciella Freer, PA-C 03/27/18  Patient seen and examined with the above-signed Advanced Practice Provider and/or Housestaff. I personally reviewed laboratory data, imaging studies and relevant notes. I independently examined the patient and formulated the important aspects of the plan. I have edited the note to reflect any of my changes or salient points. I have personally discussed the plan with the patient and/or family.  Echo reviewed personally EF 20-25% (up from 15%). Currently NYHA I-early II. 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 Still had some PVCs during echo. We dicussed proceeding with ICD now or waiting to see if LV continues to recover. Given improved LV size and function and NYHA I-II symptoms have decided to proceed with watchful waiting. Place Zio Patch to reassess PVC burden. Increase losartan. BP too soft for Entresto currently -hopefully soon.   [name], MD  11:18 PM

## 2018-03-27 NOTE — Patient Instructions (Signed)
Increase Losartan to 25 mg daily at bedtime  Your physician has recommended that you wear a holter monitor. Holter monitors are medical devices that record the heart's electrical activity. Doctors most often use these monitors to diagnose arrhythmias. Arrhythmias are problems with the speed or rhythm of the heartbeat. The monitor is a small, portable device. You can wear one while you do your normal daily activities. This is usually used to diagnose what is causing palpitations/syncope (passing out).  Your physician recommends that you schedule a follow-up appointment in: 4-6 weeks

## 2018-04-03 ENCOUNTER — Telehealth (HOSPITAL_COMMUNITY): Payer: Self-pay

## 2018-04-04 NOTE — Telephone Encounter (Signed)
Cardiac Rehab Medication Review by a Pharmacist  Does the patient feel that his/her medications are working for him/her?  yes  Has the patient been experiencing any side effects to the medications prescribed?  no  Does the patient measure his/her own blood pressure or blood glucose at home?  yes  BP 90s-100s/60s-80s  Does the patient have any problems obtaining medications due to transportation or finances?   no  Understanding of regimen: good Understanding of indications: good Potential of compliance: good    Pharmacist comments: Patient reports that she has had trouble with a cough that is not improved with loratadine. She reports that her doctor tried her on benzonatate and montelukast as well but those did not help her cough. She just ran out of those two medications. She plans to follow up with that doctor for further work-up. She also reports that she tried to contact Dr. Prescott Gum office about getting a further extension for work leave but has not received any return communication. I will send an in-basket to the Cardiac Rehab nurses to see if any of them can help with this.    Savannah Bell, PharmD PGY1 Pharmacy Resident Phone [phone] 04/04/2018     5:32 PM

## 2018-04-05 NOTE — Progress Notes (Signed)
Savannah Bell 67 y.o. female DOB 10-May-1951 MRN [id number]       Nutrition  No diagnosis found. Past Medical History:  Diagnosis Date  . History of blood transfusion    "when I had colon resection" (12/11/2017)  . Migraine    "stopped in the 1970s" (12/11/2017)  . Mitral valve prolapse    Meds reviewed.     Current Outpatient Medications (Cardiovascular):  .  amiodarone (PACERONE) 200 MG tablet, Take 1 tablet (200 mg total) by mouth daily. Marland Kitchen  atorvastatin (LIPITOR) 40 MG tablet, TAKE 1 TABLET BY MOUTH EVERY DAY AT 6PM .  carvedilol (COREG) 3.125 MG tablet, Take 3.125 mg by mouth 2 (two) times daily with a meal. .  furosemide (LASIX) 20 MG tablet, Take 1 tablet (20 mg total) by mouth daily. Marland Kitchen  losartan (COZAAR) 25 MG tablet, Take 1 tablet (25 mg total) by mouth at bedtime. Marland Kitchen  spironolactone (ALDACTONE) 25 MG tablet, Take 0.5 tablets (12.5 mg total) by mouth daily.  Current Outpatient Medications (Respiratory):  .  loratadine (CLARITIN) 10 MG tablet, Take 10 mg by mouth daily as needed for allergies.    Current Outpatient Medications (Other):  Marland Kitchen  CALCIUM-VITAMIN D PO, Take by mouth.   HT: Ht Readings from Last 1 Encounters:  12/11/17 4\' 9"  (1.448 m)    WT: Wt Readings from Last 5 Encounters:  03/27/18 141 lb 8 oz (64.2 kg)  02/25/18 142 lb 9.6 oz (64.7 kg)  01/22/18 141 lb 6.4 oz (64.1 kg)  12/25/17 142 lb 4 oz (64.5 kg)  12/16/17 145 lb 14.4 oz (66.2 kg)     BMI 30.62  Current tobacco use? No       Labs:  Lipid Panel     Component Value Date/Time   CHOL 190 12/12/2017 0246   TRIG 45 12/12/2017 0246   HDL 81 12/12/2017 0246   CHOLHDL 2.3 12/12/2017 0246   VLDL 9 12/12/2017 0246   LDLCALC 100 (H) 12/12/2017 0246    Lab Results  Component Value Date   HGBA1C 5.7 (H) 12/11/2017   CBG (last 3)  No results for input(s): GLUCAP in the last 72 hours.  Nutrition Diagnosis ? Food-and nutrition-related knowledge deficit related to lack of exposure to  information as related to diagnosis of: ? CVD ?Pre-DM ? Obesity related to excessive energy intake as evidenced by a BMI 30.62  Nutrition Goal(s):   ? Pt to identify and limit food sources of saturated fat, trans fat, and sodium. ? Pt to identify food quantities necessary to achieve weight loss of 6-24 lb (2.7-10.9 kg) at graduation from cardiac rehab. ? Pt able to name foods that affect blood glucose .  Plan:  Pt to attend nutrition classes ? Nutrition I ? Nutrition II ? Portion Distortion  Will provide client-centered nutrition education as part of interdisciplinary care.   Monitor and evaluate progress toward nutrition goal with team.  Ross Marcus, MS, RD, LDN 04/05/2018 1:41 PM

## 2018-04-09 ENCOUNTER — Encounter (HOSPITAL_COMMUNITY)
Admission: RE | Admit: 2018-04-09 | Discharge: 2018-04-09 | Disposition: A | Discharge status: Home / Self Care | Payer: 59 | Source: Ambulatory Visit | Attending: Internal Medicine | Admitting: Internal Medicine

## 2018-04-09 ENCOUNTER — Encounter (HOSPITAL_COMMUNITY): Payer: Self-pay

## 2018-04-09 VITALS — BP 94/60 | HR 62 | Ht 58.75 in | Wt 143.3 lb

## 2018-04-09 DIAGNOSIS — I214 Non-ST elevation (NSTEMI) myocardial infarction: Secondary | ICD-10-CM | POA: Insufficient documentation

## 2018-04-09 DIAGNOSIS — I509 Heart failure, unspecified: Secondary | ICD-10-CM | POA: Insufficient documentation

## 2018-04-09 DIAGNOSIS — Z713 Dietary counseling and surveillance: Secondary | ICD-10-CM | POA: Insufficient documentation

## 2018-04-09 DIAGNOSIS — I5020 Unspecified systolic (congestive) heart failure: Secondary | ICD-10-CM

## 2018-04-09 NOTE — Progress Notes (Signed)
Cardiac Individual Treatment Plan  Patient Details  Name: Savannah Bell MRN: [id number] Date of Birth: 05/05/1951 Referring Provider:   Flowsheet Row CARDIAC REHAB PHASE II ORIENTATION from 04/09/2018 in [facility] CARDIAC [facility]  Referring Provider  [name], MD      Initial Encounter Date:  Flowsheet Row CARDIAC REHAB PHASE II ORIENTATION from 04/09/2018 in [facility] CARDIAC REHAB  Date  04/09/18      Visit Diagnosis: 12/11/17 NSTEMI  Systolic congestive heart failure, unspecified HF chronicity (HCC)  Patient's Home Medications on Admission:  Current Outpatient Medications:  .  amiodarone (PACERONE) 200 MG tablet, Take 1 tablet (200 mg total) by mouth daily., Disp: 60 tablet, Rfl: 2 .  atorvastatin (LIPITOR) 40 MG tablet, TAKE 1 TABLET BY MOUTH EVERY DAY AT 6PM, Disp: 90 tablet, Rfl: 3 .  CALCIUM-VITAMIN D PO, Take by mouth., Disp: , Rfl:  .  carvedilol (COREG) 3.125 MG tablet, Take 3.125 mg by mouth 2 (two) times daily with a meal., Disp: , Rfl:  .  furosemide (LASIX) 20 MG tablet, Take 1 tablet (20 mg total) by mouth daily., Disp: 30 tablet, Rfl: 6 .  loratadine (CLARITIN) 10 MG tablet, Take 10 mg by mouth daily as needed for allergies., Disp: , Rfl:  .  losartan (COZAAR) 25 MG tablet, Take 1 tablet (25 mg total) by mouth at bedtime., Disp: 30 tablet, Rfl: 6 .  spironolactone (ALDACTONE) 25 MG tablet, Take 0.5 tablets (12.5 mg total) by mouth daily., Disp: 30 tablet, Rfl: 6  Past Medical History: Past Medical History:  Diagnosis Date  . History of blood transfusion    "when I had colon resection" (12/11/2017)  . Migraine    "stopped in the 1970s" (12/11/2017)  . Mitral valve prolapse     Tobacco Use: Social History   Tobacco Use  Smoking Status Former Smoker  . Packs/day: 0.30  . Years: 10.00  . Pack years: 3.00  . Types: Cigarettes  . Last attempt to quit: 1982  . Years since quitting: 37.7  Smokeless Tobacco Former  Neurosurgeon  . Types: Snuff, Chew  . Quit date: 2000    Labs: Recent Review Flowsheet Data    Labs for ITP Cardiac and Pulmonary Rehab Latest Ref Rng & Units 09/02/2011 09/07/2011 09/11/2011 12/11/2017 12/12/2017   Cholestrol 0 - 200 mg/dL - 409 811 - 914   LDLCALC 0 - 99 mg/dL - - - - 782(N)   HDL >56 mg/dL - - - - 81   Trlycerides <150 mg/dL - 65 213 - 45   Hemoglobin A1c 4.8 - 5.6 % - - - 5.7(H) -   TCO2 0 - 100 mmol/L 23 - - - -      Capillary Blood Glucose: Lab Results  Component Value Date   GLUCAP 96 09/15/2011   GLUCAP 109 (H) 09/15/2011   GLUCAP 117 (H) 09/14/2011   GLUCAP 110 (H) 09/14/2011   GLUCAP 112 (H) 09/14/2011     Exercise Target Goals: Exercise Program Goal: Individual exercise prescription set using results from initial 6 min walk test and THRR while considering  patient's activity barriers and safety.   Exercise Prescription Goal: Initial exercise prescription builds to 30-45 minutes a day of aerobic activity, 2-3 days per week.  Home exercise guidelines will be given to patient during program as part of exercise prescription that the participant will acknowledge.  Activity Barriers & Risk Stratification: Activity Barriers & Cardiac Risk Stratification - 04/09/18 1445  Activity Barriers & Cardiac Risk Stratification          Activity Barriers  None    Cardiac Risk Stratification  High           6 Minute Walk: 6 Minute Walk    6 Minute Walk    Row Name 04/09/18 1419   Phase  Initial   Distance  1480 feet   Walk Time  6 minutes   # of Rest Breaks  0   MPH  2.8   METS  2.92   RPE  9   Perceived Dyspnea   0   VO2 Peak  10.21   Symptoms  No   Resting HR  62 bpm   Resting BP  94/60   Resting Oxygen Saturation   100 %   Exercise Oxygen Saturation  during 6 min walk  97 %   Max Ex. HR  93 bpm   Max Ex. BP  102/78   2 Minute Post BP  102/74          Oxygen Initial Assessment:   Oxygen Re-Evaluation:   Oxygen Discharge (Final Oxygen  Re-Evaluation):   Initial Exercise Prescription: Initial Exercise Prescription - 04/09/18 1400    Date of Initial Exercise RX and Referring Provider          Date  04/09/18    Referring Provider  [name], MD    Expected Discharge Date  07/15/18        Treadmill          MPH  2.5    Grade  0    Minutes  10    METs  2.91        Bike          Level  0.7    Minutes  10    METs  3.03        NuStep          Level  2    SPM  85    Minutes  10    METs  2.5        Prescription Details          Frequency (times per week)  3    Duration  Progress to 30 minutes of continuous aerobic without signs/symptoms of physical distress        Intensity          THRR 40-80% of Max Heartrate  62-123    Ratings of Perceived Exertion  11-13    Perceived Dyspnea  0-4        Progression          Progression  Continue to progress workloads to maintain intensity without signs/symptoms of physical distress.        Resistance Training          Training Prescription  Yes    Weight  2lbs    Reps  10-15           Perform Capillary Blood Glucose checks as needed.  Exercise Prescription Changes:   Exercise Comments:   Exercise Goals and Review: Exercise Goals    Exercise Goals    Row Name 04/09/18 1402   Increase Physical Activity  Yes   Intervention  Provide advice, education, support and counseling about physical activity/exercise needs.;Develop an individualized exercise prescription for aerobic and resistive training based on initial evaluation findings, risk stratification, comorbidities and participant's personal goals.   Expected Outcomes  Short Term: Attend rehab on a  regular basis to increase amount of physical activity.;Long Term: Exercising regularly at least 3-5 days a week.;Long Term: Add in home exercise to make exercise part of routine and to increase amount of physical activity.   Increase Strength and Stamina  Yes   Intervention  Provide advice,  education, support and counseling about physical activity/exercise needs.;Develop an individualized exercise prescription for aerobic and resistive training based on initial evaluation findings, risk stratification, comorbidities and participant's personal goals.   Expected Outcomes  Short Term: Increase workloads from initial exercise prescription for resistance, speed, and METs.;Short Term: Perform resistance training exercises routinely during rehab and add in resistance training at home;Long Term: Improve cardiorespiratory fitness, muscular endurance and strength as measured by increased METs and functional capacity ( )   Able to understand and use rate of perceived exertion (RPE) scale  Yes   Intervention  Provide education and explanation on how to use RPE scale   Expected Outcomes  Short Term: Able to use RPE daily in rehab to express subjective intensity level;Long Term:  Able to use RPE to guide intensity level when exercising independently   Knowledge and understanding of Target Heart Rate Range (THRR)  Yes   Intervention  Provide education and explanation of THRR including how the numbers were predicted and where they are located for reference   Expected Outcomes  Short Term: Able to state/look up THRR;Long Term: Able to use THRR to govern intensity when exercising independently;Short Term: Able to use daily as guideline for intensity in rehab   Able to check pulse independently  Yes   Intervention  Provide education and demonstration on how to check pulse in carotid and radial arteries.;Review the importance of being able to check your own pulse for safety during independent exercise   Expected Outcomes  Short Term: Able to explain why pulse checking is important during independent exercise;Long Term: Able to check pulse independently and accurately   Understanding of Exercise Prescription  Yes   Intervention  Provide education, explanation, and written materials on patient's individual  exercise prescription   Expected Outcomes  Short Term: Able to explain program exercise prescription;Long Term: Able to explain home exercise prescription to exercise independently          Exercise Goals Re-Evaluation :   Discharge Exercise Prescription (Final Exercise Prescription Changes):   Nutrition:  Target Goals: Understanding of nutrition guidelines, daily intake of sodium 1500mg , cholesterol 200mg , calories 30% from fat and 7% or less from saturated fats, daily to have 5 or more servings of fruits and vegetables.  Biometrics: Pre Biometrics - 04/09/18 1350    Pre Biometrics          Height  4' 10.75" (1.492 m)    Weight  65 kg    Waist Circumference  32 inches    Hip Circumference  41.75 inches    Waist to Hip Ratio  0.77 %    BMI (Calculated)  29.2    Triceps Skinfold  31 mm    % Body Fat  40 %    Grip Strength  29.5 kg    Flexibility  18 in    Single Leg Stand  30 seconds            Nutrition Therapy Plan and Nutrition Goals:   Nutrition Assessments:   Nutrition Goals Re-Evaluation:   Nutrition Goals Re-Evaluation:   Nutrition Goals Discharge (Final Nutrition Goals Re-Evaluation):   Psychosocial: Target Goals: Acknowledge presence or absence of significant depression and/or stress, maximize coping  skills, provide positive support system. Participant is able to verbalize types and ability to use techniques and skills needed for reducing stress and depression.  Initial Review & Psychosocial Screening: Initial Psych Review & Screening - 04/09/18 1438    Initial Review          Current issues with  Current Stress Concerns;Current Anxiety/Panic    Source of Stress Concerns  Chronic Illness;Unable to perform yard/household activities;Unable to participate in former interests or hobbies        Family Dynamics          Good Support System?  No        Barriers          Psychosocial barriers to participate in program  The patient should benefit  from training in stress management and relaxation.        Screening Interventions          Interventions  Encouraged to exercise           Quality of Life Scores: Quality of Life - 04/09/18 1453    Quality of Life          Select  Quality of Life        Quality of Life Scores          Health/Function Pre  30 %    Socioeconomic Pre  30 %    Psych/Spiritual Pre  30 %    Family Pre  30 %    GLOBAL Pre  30 %          Scores of 19 and below usually indicate a poorer quality of life in these areas.  A difference of  2-3 points is a clinically meaningful difference.  A difference of 2-3 points in the total score of the Quality of Life Index has been associated with significant improvement in overall quality of life, self-image, physical symptoms, and general health in studies assessing change in quality of life.  PHQ-9: Recent Review Flowsheet Data    There is no flowsheet data to display.     Interpretation of Total Score  Total Score Depression Severity:  1-4 = Minimal depression, 5-9 = Mild depression, 10-14 = Moderate depression, 15-19 = Moderately severe depression, 20-27 = Severe depression   Psychosocial Evaluation and Intervention:   Psychosocial Re-Evaluation:   Psychosocial Discharge (Final Psychosocial Re-Evaluation):   Vocational Rehabilitation: Provide vocational rehab assistance to qualifying candidates.   Vocational Rehab Evaluation & Intervention: Vocational Rehab - 04/09/18 1438    Initial Vocational Rehab Evaluation & Intervention          Assessment shows need for Vocational Rehabilitation  No   return to work as Geologist, engineering           Education: Education Goals: Education classes will be provided on a weekly basis, covering required topics. Participant will state understanding/return demonstration of topics presented.  Learning Barriers/Preferences:   Education Topics: Count Your Pulse:  -Group instruction provided by verbal  instruction, demonstration, patient participation and written materials to support subject.  Instructors address importance of being able to find your pulse and how to count your pulse when at home without a heart monitor.  Patients get hands on experience counting their pulse with staff help and individually.   Heart Attack, Angina, and Risk Factor Modification:  -Group instruction provided by verbal instruction, video, and written materials to support subject.  Instructors address signs and symptoms of angina and heart attacks.    Also discuss risk  factors for heart disease and how to make changes to improve heart health risk factors.   Functional Fitness:  -Group instruction provided by verbal instruction, demonstration, patient participation, and written materials to support subject.  Instructors address safety measures for doing things around the house.  Discuss how to get up and down off the floor, how to pick things up properly, how to safely get out of a chair without assistance, and balance training.   Meditation and Mindfulness:  -Group instruction provided by verbal instruction, patient participation, and written materials to support subject.  Instructor addresses importance of mindfulness and meditation practice to help reduce stress and improve awareness.  Instructor also leads participants through a meditation exercise.    Stretching for Flexibility and Mobility:  -Group instruction provided by verbal instruction, patient participation, and written materials to support subject.  Instructors lead participants through series of stretches that are designed to increase flexibility thus improving mobility.  These stretches are additional exercise for major muscle groups that are typically performed during regular warm up and cool down.   Hands Only CPR:  -Group verbal, video, and participation provides a basic overview of AHA guidelines for community CPR. Role-play of emergencies allow  participants the opportunity to practice calling for help and chest compression technique with discussion of AED use.   Hypertension: -Group verbal and written instruction that provides a basic overview of hypertension including the most recent diagnostic guidelines, risk factor reduction with self-care instructions and medication management.    Nutrition I class: Heart Healthy Eating:  -Group instruction provided by PowerPoint slides, verbal discussion, and written materials to support subject matter. The instructor gives an explanation and review of the Therapeutic Lifestyle Changes diet recommendations, which includes a discussion on lipid goals, dietary fat, sodium, fiber, plant stanol/sterol esters, sugar, and the components of a well-balanced, healthy diet.   Nutrition II class: Lifestyle Skills:  -Group instruction provided by PowerPoint slides, verbal discussion, and written materials to support subject matter. The instructor gives an explanation and review of label reading, grocery shopping for heart health, heart healthy recipe modifications, and ways to make healthier choices when eating out.   Diabetes Question & Answer:  -Group instruction provided by PowerPoint slides, verbal discussion, and written materials to support subject matter. The instructor gives an explanation and review of diabetes co-morbidities, pre- and post-prandial blood glucose goals, pre-exercise blood glucose goals, signs, symptoms, and treatment of hypoglycemia and hyperglycemia, and foot care basics.   Diabetes Blitz:  -Group instruction provided by PowerPoint slides, verbal discussion, and written materials to support subject matter. The instructor gives an explanation and review of the physiology behind type 1 and type 2 diabetes, diabetes medications and rational behind using different medications, pre- and post-prandial blood glucose recommendations and Hemoglobin A1c goals, diabetes diet, and exercise  including blood glucose guidelines for exercising safely.    Portion Distortion:  -Group instruction provided by PowerPoint slides, verbal discussion, written materials, and food models to support subject matter. The instructor gives an explanation of serving size versus portion size, changes in portions sizes over the last 20 years, and what consists of a serving from each food group.   Stress Management:  -Group instruction provided by verbal instruction, video, and written materials to support subject matter.  Instructors review role of stress in heart disease and how to cope with stress positively.     Exercising on Your Own:  -Group instruction provided by verbal instruction, power point, and written materials to support subject.  Instructors discuss benefits of exercise, components of exercise, frequency and intensity of exercise, and end points for exercise.  Also discuss use of nitroglycerin and activating EMS.  Review options of places to exercise outside of rehab.  Review guidelines for sex with heart disease.   Cardiac Drugs I:  -Group instruction provided by verbal instruction and written materials to support subject.  Instructor reviews cardiac drug classes: antiplatelets, anticoagulants, beta blockers, and statins.  Instructor discusses reasons, side effects, and lifestyle considerations for each drug class.   Cardiac Drugs II:  -Group instruction provided by verbal instruction and written materials to support subject.  Instructor reviews cardiac drug classes: angiotensin converting enzyme inhibitors (ACE-I), angiotensin II receptor blockers (ARBs), nitrates, and calcium channel blockers.  Instructor discusses reasons, side effects, and lifestyle considerations for each drug class.   Anatomy and Physiology of the Circulatory System:  Group verbal and written instruction and models provide basic cardiac anatomy and physiology, with the coronary electrical and arterial systems.  Review of: AMI, Angina, Valve disease, Heart Failure, Peripheral Artery Disease, Cardiac Arrhythmia, Pacemakers, and the ICD.   Other Education:  -Group or individual verbal, written, or video instructions that support the educational goals of the cardiac rehab program.   Holiday Eating Survival Tips:  -Group instruction provided by PowerPoint slides, verbal discussion, and written materials to support subject matter. The instructor gives patients tips, tricks, and techniques to help them not only survive but enjoy the holidays despite the onslaught of food that accompanies the holidays.   Knowledge Questionnaire Score: Knowledge Questionnaire Score - 04/09/18 1440    Knowledge Questionnaire Score          Pre Score  18/24           Core Components/Risk Factors/Patient Goals at Admission: Personal Goals and Risk Factors at Admission - 04/09/18 1433    Core Components/Risk Factors/Patient Goals on Admission           Weight Management  Obesity;Yes    Intervention  Weight Management: Develop a combined nutrition and exercise program designed to reach desired caloric intake, while maintaining appropriate intake of nutrient and fiber, sodium and fats, and appropriate energy expenditure required for the weight goal.;Weight Management: Provide education and appropriate resources to help participant work on and attain dietary goals.;Weight Management/Obesity: Establish reasonable short term and long term weight goals.;Obesity: Provide education and appropriate resources to help participant work on and attain dietary goals.    Expected Outcomes  Short Term: Continue to assess and modify interventions until short term weight is achieved;Long Term: Adherence to nutrition and physical activity/exercise program aimed toward attainment of established weight goal;Weight Loss: Understanding of general recommendations for a balanced deficit meal plan, which promotes 1-2 lb weight loss per week and  includes a negative energy balance of [phone] kcal/d    Heart Failure  Yes    Intervention  Provide a combined exercise and nutrition program that is supplemented with education, support and counseling about heart failure. Directed toward relieving symptoms such as shortness of breath, decreased exercise tolerance, and extremity edema.    Expected Outcomes  Improve functional capacity of life;Short term: Daily weights obtained and reported for increase. Utilizing diuretic protocols set by physician.;Short term: Attendance in program 2-3 days a week with increased exercise capacity. Reported lower sodium intake. Reported increased fruit and vegetable intake. Reports medication compliance.;Long term: Adoption of self-care skills and reduction of barriers for early signs and symptoms recognition and intervention leading to self-care maintenance.  Lipids  Yes    Intervention  Provide education and support for participant on nutrition & aerobic/resistive exercise along with prescribed medications to achieve LDL 70mg , HDL >40mg .    Expected Outcomes  Short Term: Participant states understanding of desired cholesterol values and is compliant with medications prescribed. Participant is following exercise prescription and nutrition guidelines.;Long Term: Cholesterol controlled with medications as prescribed, with individualized exercise RX and with personalized nutrition plan. Value goals: LDL < 70mg , HDL > 40 mg.    Stress  Yes    Intervention  Offer individual and/or small group education and counseling on adjustment to heart disease, stress management and health-related lifestyle change. Teach and support self-help strategies.;Refer participants experiencing significant psychosocial distress to appropriate mental health specialists for further evaluation and treatment. When possible, include family members and significant others in education/counseling sessions.    Expected Outcomes  Short Term: Participant  demonstrates changes in health-related behavior, relaxation and other stress management skills, ability to obtain effective social support, and compliance with psychotropic medications if prescribed.;Long Term: Emotional wellbeing is indicated by absence of clinically significant psychosocial distress or social isolation.           Core Components/Risk Factors/Patient Goals Review:    Core Components/Risk Factors/Patient Goals at Discharge (Final Review):    ITP Comments: ITP Comments    Row Name 04/09/18 1353   ITP Comments  Dr.Traci Turner, Medical Director       Comments: Patient attended orientation from 1353 to 1510 to review rules and guidelines for program. Completed 6 minute walk test, Intitial ITP, and exercise prescription.  VSS. Telemetry-sinus rhythm, BBB,  Asymptomatic.  Deveron Furlong, RN, BSN Cardiac Pulmonary Rehab 04/09/18 3:45 PM

## 2018-04-09 NOTE — Progress Notes (Signed)
Savannah Bell 67 y.o. female DOB: 08/31/1950 MRN: [id number]      Nutrition Note  1. 12/11/17 NSTEMI   2. Systolic congestive heart failure, unspecified HF chronicity (HCC)    Past Medical History:  Diagnosis Date  . History of blood transfusion    "when I had colon resection" (12/11/2017)  . Migraine    "stopped in the 1970s" (12/11/2017)  . Mitral valve prolapse    Meds reviewed.    Current Outpatient Medications (Cardiovascular):  .  amiodarone (PACERONE) 200 MG tablet, Take 1 tablet (200 mg total) by mouth daily. Marland Kitchen  atorvastatin (LIPITOR) 40 MG tablet, TAKE 1 TABLET BY MOUTH EVERY DAY AT 6PM .  carvedilol (COREG) 3.125 MG tablet, Take 3.125 mg by mouth 2 (two) times daily with a meal. .  furosemide (LASIX) 20 MG tablet, Take 1 tablet (20 mg total) by mouth daily. Marland Kitchen  losartan (COZAAR) 25 MG tablet, Take 1 tablet (25 mg total) by mouth at bedtime. Marland Kitchen  spironolactone (ALDACTONE) 25 MG tablet, Take 0.5 tablets (12.5 mg total) by mouth daily.  Current Outpatient Medications (Respiratory):  .  loratadine (CLARITIN) 10 MG tablet, Take 10 mg by mouth daily as needed for allergies.    Current Outpatient Medications (Other):  Marland Kitchen  CALCIUM-VITAMIN D PO, Take by mouth.   HT: Ht Readings from Last 1 Encounters:  04/09/18 4' 10.75" (1.492 m)    WT: Wt Readings from Last 5 Encounters:  04/09/18 143 lb 4.8 oz (65 kg)  03/27/18 141 lb 8 oz (64.2 kg)  02/25/18 142 lb 9.6 oz (64.7 kg)  01/22/18 141 lb 6.4 oz (64.1 kg)  12/25/17 142 lb 4 oz (64.5 kg)     Body mass index is 29.19 kg/m.   Current tobacco use? No  Labs:  Lipid Panel     Component Value Date/Time   CHOL 190 12/12/2017 0246   TRIG 45 12/12/2017 0246   HDL 81 12/12/2017 0246   CHOLHDL 2.3 12/12/2017 0246   VLDL 9 12/12/2017 0246   LDLCALC 100 (H) 12/12/2017 0246    Lab Results  Component Value Date   HGBA1C 5.7 (H) 12/11/2017   CBG (last 3)  No results for input(s): GLUCAP in the last 72 hours.  Nutrition  Note Spoke with pt. Nutrition plan and goals reviewed with pt. Pt is following Step 2 of the Therapeutic Lifestyle Changes diet. Pt wants to maintain weight. Wt maintenance heart healthy tips reviewed (label reading, how to build a healthy plate, portion sizes, eating frequently across the day).  Pt with dx of CHF. Per discussion, pt does not not use canned/convenience foods often. Pt does not add salt to food. Pt does not eat out frequently. Pt expressed understanding of the information reviewed. Pt aware of nutrition education classes offered and would like to attend nutrition classes.  Nutrition Diagnosis ? Food-and nutrition-related knowledge deficit related to lack of exposure to information as related to diagnosis of: ? CVD      Nutrition Intervention ? Pt's individual nutrition plan and goals reviewed with pt.  Nutrition Goal(s):   ? Pt to identify and limit food sources of saturated fat, trans fat, refined carbohydrates and sodium    Plan:  ? Pt to attend nutrition classes ? Nutrition I ? Nutrition II ? Portion Distortion   ? Will provide client-centered nutrition education as part of interdisciplinary care ? Monitor and evaluate progress toward nutrition goal with team.   Ross Marcus, MS, RD, LDN 04/09/2018 3:13 PM

## 2018-04-10 ENCOUNTER — Telehealth (HOSPITAL_COMMUNITY): Payer: Self-pay | Admitting: Cardiac Rehabilitation

## 2018-04-10 NOTE — Telephone Encounter (Signed)
-----   Message from Dolores Patty, MD sent at 04/09/2018  8:16 PM EDT ----- Regarding: RE: cardiac rehab  Ok to exercise as long as SBP 90 or greater and not symptomatic.   ----- Message ----- From: Robyne Peers, RN Sent: 04/09/2018   3:45 PM EDT To: Dolores Patty, MD Subject: cardiac rehab                                  Dear Dr. [name],  Pt scheduled to begin cardiac rehab.  Orientation resting BP: 94/60.  Pt asymptomatic.  Please indicate BP parameters for cardiac rehab.  Thank you, Deveron Furlong, RN, BSN Cardiac Pulmonary Rehab

## 2018-04-12 ENCOUNTER — Telehealth (HOSPITAL_COMMUNITY): Payer: Self-pay | Admitting: Cardiac Rehabilitation

## 2018-04-12 NOTE — Telephone Encounter (Signed)
-----   Message from Noralee Space, RN sent at 04/10/2018  5:02 PM EDT ----- No doesn't look like she has called [place], I'll try to reach out to her tomorrow, sorry I've been off since Fri so I"m just trying to catch up, thanks ----- Message ----- From: Robyne Peers, RN Sent: 04/10/2018   6:43 AM EDT To: Noralee Space, RN  Nehemiah Massed, I received this message from the pharmacist who does our med review for cardiac orientation. Do you know if the patient has been in contact with you about her work leave extension?  Thank you, Deveron Furlong, RN ----- Message ----- From: Toniann Fail, [facility] Sent: 04/04/2018   5:33 PM EDT To: Cammy Copa, RN, Chelsea Aus, RN, #  [facility] Cardiac Rehab Team,  Spoke with Ms. Pondexter today to do her med history prior to her appointment next week. She mentioned that she has tried to contact Dr. Prescott Gum office to see if he would provide a doctor's letter to her workplace for an extension for work leave, but has not heard back. Would any of you be able to help with this? If I should contact someone else, please let me know! Thanks!  Arvilla Market, PharmD PGY1 Pharmacy Resident Phone [phone] 04/04/2018     5:36 PM

## 2018-04-15 ENCOUNTER — Encounter (HOSPITAL_COMMUNITY)
Admission: RE | Admit: 2018-04-15 | Discharge: 2018-04-15 | Disposition: A | Discharge status: Home / Self Care | Payer: 59 | Source: Ambulatory Visit | Attending: Internal Medicine | Admitting: Internal Medicine

## 2018-04-15 ENCOUNTER — Encounter (HOSPITAL_COMMUNITY): Payer: 59

## 2018-04-15 DIAGNOSIS — I214 Non-ST elevation (NSTEMI) myocardial infarction: Secondary | ICD-10-CM | POA: Diagnosis not present

## 2018-04-15 DIAGNOSIS — Z713 Dietary counseling and surveillance: Secondary | ICD-10-CM | POA: Diagnosis not present

## 2018-04-15 DIAGNOSIS — I5020 Unspecified systolic (congestive) heart failure: Secondary | ICD-10-CM

## 2018-04-15 DIAGNOSIS — I509 Heart failure, unspecified: Secondary | ICD-10-CM | POA: Diagnosis present

## 2018-04-15 NOTE — Progress Notes (Signed)
Daily Session Note  Patient Details  Name: DANETTA PROM MRN: [id number] Date of Birth: [birth year] Referring Provider:     CARDIAC REHAB PHASE II ORIENTATION from 04/09/2018 in [place]  Referring Provider  Glori Bickers, MD      Encounter Date: 04/15/2018  Check In: Session Check In - 04/15/18 1418      Check-In   Supervising physician immediately available to respond to emergencies  [facility] immediately available    Physician(s)  Dr. Sloan Leiter    Location  [facility]    Staff Present  Seward Carol, MS, ACSM CEP, Exercise Physiologist;Maria Venetia Maxon, RN, Toma Deiters, RN, BSN    Medication changes reported      No    Fall or balance concerns reported     No    Tobacco Cessation  No Change    Warm-up and Cool-down  Performed as group-led instruction    Resistance Training Performed  Yes    VAD Patient?  No    PAD/SET Patient?  No      Pain Assessment   Currently in Pain?  No/denies       Capillary Blood Glucose: No results found for this or any previous visit (from the past 24 hour(s)).    Social History   Tobacco Use  Smoking Status Former Smoker  . Packs/day: 0.30  . Years: 10.00  . Pack years: 3.00  . Types: Cigarettes  . Last attempt to quit: 1982  . Years since quitting: 37.7  Smokeless Tobacco Former Systems developer  . Types: Snuff, Chew  . Quit date: 2000    Goals Met:  Exercise tolerated well  Goals Unmet:  Not Applicable  Comments: Pt started cardiac rehab today.  Pt tolerated light exercise without difficulty. VSS per Dr Benshimon's parameter's systolic blood pressure 53-967'S systolic, telemetry-Sinus Rhythm with a bundle branch blovk, asymptomatic.  Medication list reconciled. Pt denies barriers to medicaiton compliance.  PSYCHOSOCIAL ASSESSMENT:  PHQ-0. Pt exhibits positive coping skills, hopeful outlook with supportive family. No psychosocial needs identified at this time, no  psychosocial interventions necessary.    Pt enjoys being active and scrapbooking.   Pt oriented to exercise equipment and routine.    Understanding verbalized.Barnet Pall, RN,BSN 04/15/2018 4:51 PM  Dr. Fransico Him is Medical Director for Cardiac Rehab at [facility].

## 2018-04-17 ENCOUNTER — Encounter (HOSPITAL_COMMUNITY)
Admission: RE | Admit: 2018-04-17 | Discharge: 2018-04-17 | Disposition: A | Discharge status: Home / Self Care | Payer: 59 | Source: Ambulatory Visit | Attending: Internal Medicine | Admitting: Internal Medicine

## 2018-04-17 ENCOUNTER — Encounter (HOSPITAL_COMMUNITY): Payer: 59

## 2018-04-17 DIAGNOSIS — I214 Non-ST elevation (NSTEMI) myocardial infarction: Secondary | ICD-10-CM | POA: Insufficient documentation

## 2018-04-17 DIAGNOSIS — Z713 Dietary counseling and surveillance: Secondary | ICD-10-CM | POA: Insufficient documentation

## 2018-04-17 DIAGNOSIS — I5020 Unspecified systolic (congestive) heart failure: Secondary | ICD-10-CM

## 2018-04-17 DIAGNOSIS — I509 Heart failure, unspecified: Secondary | ICD-10-CM | POA: Insufficient documentation

## 2018-04-18 NOTE — Progress Notes (Signed)
Cardiac Individual Treatment Plan  Patient Details  Name: Savannah Bell MRN: [id number] Date of Birth: 1950/10/05 Referring Provider:   Flowsheet Row CARDIAC REHAB PHASE II ORIENTATION from 04/09/2018 in [facility] CARDI[facility]  Referring Provider  [name], MD      Initial Encounter Date:  Flowsheet Row CARDIAC REHAB PHASE II ORIENTATION from 04/09/2018 in [facility] CARDIAC REHAB  Date  04/09/18      Visit Diagnosis: 12/11/17 NSTEMI  Systolic congestive heart failure, unspecified HF chronicity (HCC)  Patient's Home Medications on Admission:  Current Outpatient Medications:  .  amiodarone (PACERONE) 200 MG tablet, Take 1 tablet (200 mg total) by mouth daily., Disp: 60 tablet, Rfl: 2 .  atorvastatin (LIPITOR) 40 MG tablet, TAKE 1 TABLET BY MOUTH EVERY DAY AT 6PM, Disp: 90 tablet, Rfl: 3 .  CALCIUM-VITAMIN D PO, Take by mouth., Disp: , Rfl:  .  carvedilol (COREG) 3.125 MG tablet, Take 3.125 mg by mouth 2 (two) times daily with a meal., Disp: , Rfl:  .  furosemide (LASIX) 20 MG tablet, Take 1 tablet (20 mg total) by mouth daily., Disp: 30 tablet, Rfl: 6 .  loratadine (CLARITIN) 10 MG tablet, Take 10 mg by mouth daily as needed for allergies., Disp: , Rfl:  .  losartan (COZAAR) 25 MG tablet, Take 1 tablet (25 mg total) by mouth at bedtime., Disp: 30 tablet, Rfl: 6 .  spironolactone (ALDACTONE) 25 MG tablet, Take 0.5 tablets (12.5 mg total) by mouth daily., Disp: 30 tablet, Rfl: 6  Past Medical History: Past Medical History:  Diagnosis Date  . History of blood transfusion    "when I had colon resection" (12/11/2017)  . Migraine    "stopped in the 1970s" (12/11/2017)  . Mitral valve prolapse     Tobacco Use: Social History   Tobacco Use  Smoking Status Former Smoker  . Packs/day: 0.30  . Years: 10.00  . Pack years: 3.00  . Types: Cigarettes  . Last attempt to quit: 1982  . Years since quitting: 37.7  Smokeless Tobacco Former  Neurosurgeon  . Types: Snuff, Chew  . Quit date: 2000    Labs: Recent Review Flowsheet Data    Labs for ITP Cardiac and Pulmonary Rehab Latest Ref Rng & Units 09/02/2011 09/07/2011 09/11/2011 12/11/2017 12/12/2017   Cholestrol 0 - 200 mg/dL - 409 811 - 914   LDLCALC 0 - 99 mg/dL - - - - 782(N)   HDL >56 mg/dL - - - - 81   Trlycerides <150 mg/dL - 65 213 - 45   Hemoglobin A1c 4.8 - 5.6 % - - - 5.7(H) -   TCO2 0 - 100 mmol/L 23 - - - -      Capillary Blood Glucose: Lab Results  Component Value Date   GLUCAP 96 09/15/2011   GLUCAP 109 (H) 09/15/2011   GLUCAP 117 (H) 09/14/2011   GLUCAP 110 (H) 09/14/2011   GLUCAP 112 (H) 09/14/2011     Exercise Target Goals: Exercise Program Goal: Individual exercise prescription set using results from initial 6 min walk test and THRR while considering  patient's activity barriers and safety.   Exercise Prescription Goal: Initial exercise prescription builds to 30-45 minutes a day of aerobic activity, 2-3 days per week.  Home exercise guidelines will be given to patient during program as part of exercise prescription that the participant will acknowledge.  Activity Barriers & Risk Stratification: Activity Barriers & Cardiac Risk Stratification - 04/09/18 1445  Activity Barriers & Cardiac Risk Stratification          Activity Barriers  None    Cardiac Risk Stratification  High           6 Minute Walk: 6 Minute Walk    6 Minute Walk    Row Name 04/09/18 1419   Phase  Initial   Distance  1480 feet   Walk Time  6 minutes   # of Rest Breaks  0   MPH  2.8   METS  2.92   RPE  9   Perceived Dyspnea   0   VO2 Peak  10.21   Symptoms  No   Resting HR  62 bpm   Resting BP  94/60   Resting Oxygen Saturation   100 %   Exercise Oxygen Saturation  during 6 min walk  97 %   Max Ex. HR  93 bpm   Max Ex. BP  102/78   2 Minute Post BP  102/74          Oxygen Initial Assessment:   Oxygen Re-Evaluation:   Oxygen Discharge (Final Oxygen  Re-Evaluation):   Initial Exercise Prescription: Initial Exercise Prescription - 04/09/18 1400    Date of Initial Exercise RX and Referring Provider          Date  04/09/18    Referring Provider  [name], MD    Expected Discharge Date  07/15/18        Treadmill          MPH  2.5    Grade  0    Minutes  10    METs  2.91        Bike          Level  0.7    Minutes  10    METs  3.03        NuStep          Level  2    SPM  85    Minutes  10    METs  2.5        Prescription Details          Frequency (times per week)  3    Duration  Progress to 30 minutes of continuous aerobic without signs/symptoms of physical distress        Intensity          THRR 40-80% of Max Heartrate  62-123    Ratings of Perceived Exertion  11-13    Perceived Dyspnea  0-4        Progression          Progression  Continue to progress workloads to maintain intensity without signs/symptoms of physical distress.        Resistance Training          Training Prescription  Yes    Weight  2lbs    Reps  10-15           Perform Capillary Blood Glucose checks as needed.  Exercise Prescription Changes: Exercise Prescription Changes    Response to Exercise    Row Name 04/15/18 1330   Blood Pressure (Admit)  92/64   Blood Pressure (Exercise)  108/70   Blood Pressure (Exit)  102/72   Heart Rate (Admit)  78 bpm   Heart Rate (Exercise)  105 bpm   Heart Rate (Exit)  78 bpm   Rating of Perceived Exertion (Exercise)  11   Symptoms  none  Duration  Progress to 30 minutes of  aerobic without signs/symptoms of physical distress   Intensity  THRR unchanged       Progression    Row Name 04/15/18 1330   Progression  Continue to progress workloads to maintain intensity without signs/symptoms of physical distress.   Average METs  2.5       Resistance Training    Row Name 04/15/18 1330   Training Prescription  Yes   Weight  2lbs   Reps  10-15   Time  10 Minutes        Interval Training    Row Name 04/15/18 1330   Interval Training  No       Treadmill    Row Name 04/15/18 1330   MPH  no documentation   Grade  no documentation   Minutes  no documentation   METs  no documentation       Bike    Row Name 04/15/18 1330   Level  0.7   Minutes  10   METs  3.03       NuStep    Row Name 04/15/18 1330   Level  2   SPM  85   Minutes  10   METs  1.9       Track    Row Name 04/15/18 1330   Laps  9   Minutes  10   METs  2.57          Exercise Comments: Exercise Comments    Row Name 04/15/18 1423   Exercise Comments  Patient tolerated first session of exercise well without c/o.      Exercise Goals and Review: Exercise Goals    Exercise Goals    Row Name 04/09/18 1402   Increase Physical Activity  Yes   Intervention  Provide advice, education, support and counseling about physical activity/exercise needs.;Develop an individualized exercise prescription for aerobic and resistive training based on initial evaluation findings, risk stratification, comorbidities and participant's personal goals.   Expected Outcomes  Short Term: Attend rehab on a regular basis to increase amount of physical activity.;Long Term: Exercising regularly at least 3-5 days a week.;Long Term: Add in home exercise to make exercise part of routine and to increase amount of physical activity.   Increase Strength and Stamina  Yes   Intervention  Provide advice, education, support and counseling about physical activity/exercise needs.;Develop an individualized exercise prescription for aerobic and resistive training based on initial evaluation findings, risk stratification, comorbidities and participant's personal goals.   Expected Outcomes  Short Term: Increase workloads from initial exercise prescription for resistance, speed, and METs.;Short Term: Perform resistance training exercises routinely during rehab and add in resistance training at home;Long Term: Improve  cardiorespiratory fitness, muscular endurance and strength as measured by increased METs and functional capacity ( )   Able to understand and use rate of perceived exertion (RPE) scale  Yes   Intervention  Provide education and explanation on how to use RPE scale   Expected Outcomes  Short Term: Able to use RPE daily in rehab to express subjective intensity level;Long Term:  Able to use RPE to guide intensity level when exercising independently   Knowledge and understanding of Target Heart Rate Range (THRR)  Yes   Intervention  Provide education and explanation of THRR including how the numbers were predicted and where they are located for reference   Expected Outcomes  Short Term: Able to state/look up THRR;Long Term: Able to use THRR to govern  intensity when exercising independently;Short Term: Able to use daily as guideline for intensity in rehab   Able to check pulse independently  Yes   Intervention  Provide education and demonstration on how to check pulse in carotid and radial arteries.;Review the importance of being able to check your own pulse for safety during independent exercise   Expected Outcomes  Short Term: Able to explain why pulse checking is important during independent exercise;Long Term: Able to check pulse independently and accurately   Understanding of Exercise Prescription  Yes   Intervention  Provide education, explanation, and written materials on patient's individual exercise prescription   Expected Outcomes  Short Term: Able to explain program exercise prescription;Long Term: Able to explain home exercise prescription to exercise independently          Exercise Goals Re-Evaluation : Exercise Goals Re-Evaluation    Exercise Goal Re-Evaluation    Row Name 04/15/18 1423   Exercise Goals Review  Able to understand and use rate of perceived exertion (RPE) scale;Increase Physical Activity   Comments  Patient able to understand and use RPE scale appropriately.    Expected Outcomes  Increase workloads as tolerated to help improve cardiorespiratory fitness.          Discharge Exercise Prescription (Final Exercise Prescription Changes): Exercise Prescription Changes - 04/15/18 1330    Response to Exercise          Blood Pressure (Admit)  92/64    Blood Pressure (Exercise)  108/70    Blood Pressure (Exit)  102/72    Heart Rate (Admit)  78 bpm    Heart Rate (Exercise)  105 bpm    Heart Rate (Exit)  78 bpm    Rating of Perceived Exertion (Exercise)  11    Symptoms  none    Duration  Progress to 30 minutes of  aerobic without signs/symptoms of physical distress    Intensity  THRR unchanged        Progression          Progression  Continue to progress workloads to maintain intensity without signs/symptoms of physical distress.    Average METs  2.5        Resistance Training          Training Prescription  Yes    Weight  2lbs    Reps  10-15    Time  10 Minutes        Interval Training          Interval Training  No        Treadmill          MPH  --    Grade  --    Minutes  --    METs  --        Bike          Level  0.7    Minutes  10    METs  3.03        NuStep          Level  2    SPM  85    Minutes  10    METs  1.9        Track          Laps  9    Minutes  10    METs  2.57           Nutrition:  Target Goals: Understanding of nutrition guidelines, daily intake of sodium 1500mg , cholesterol 200mg , calories 30% from fat  and 7% or less from saturated fats, daily to have 5 or more servings of fruits and vegetables.  Biometrics: Pre Biometrics - 04/09/18 1350    Pre Biometrics          Height  4' 10.75" (1.492 m)    Weight  65 kg    Waist Circumference  32 inches    Hip Circumference  41.75 inches    Waist to Hip Ratio  0.77 %    BMI (Calculated)  29.2    Triceps Skinfold  31 mm    % Body Fat  40 %    Grip Strength  29.5 kg    Flexibility  18 in    Single Leg Stand  30 seconds             Nutrition Therapy Plan and Nutrition Goals:   Nutrition Assessments:   Nutrition Goals Re-Evaluation:   Nutrition Goals Re-Evaluation:   Nutrition Goals Discharge (Final Nutrition Goals Re-Evaluation):   Psychosocial: Target Goals: Acknowledge presence or absence of significant depression and/or stress, maximize coping skills, provide positive support system. Participant is able to verbalize types and ability to use techniques and skills needed for reducing stress and depression.  Initial Review & Psychosocial Screening: Initial Psych Review & Screening - 04/09/18 1438    Initial Review          Current issues with  Current Stress Concerns;Current Anxiety/Panic    Source of Stress Concerns  Chronic Illness;Unable to perform yard/household activities;Unable to participate in former interests or hobbies        Family Dynamics          Good Support System?  No        Barriers          Psychosocial barriers to participate in program  The patient should benefit from training in stress management and relaxation.        Screening Interventions          Interventions  Encouraged to exercise           Quality of Life Scores: Quality of Life - 04/09/18 1453    Quality of Life          Select  Quality of Life        Quality of Life Scores          Health/Function Pre  30 %    Socioeconomic Pre  30 %    Psych/Spiritual Pre  30 %    Family Pre  30 %    GLOBAL Pre  30 %          Scores of 19 and below usually indicate a poorer quality of life in these areas.  A difference of  2-3 points is a clinically meaningful difference.  A difference of 2-3 points in the total score of the Quality of Life Index has been associated with significant improvement in overall quality of life, self-image, physical symptoms, and general health in studies assessing change in quality of life.  PHQ-9: Recent Review Flowsheet Data    There is no flowsheet data to display.      Interpretation of Total Score  Total Score Depression Severity:  1-4 = Minimal depression, 5-9 = Mild depression, 10-14 = Moderate depression, 15-19 = Moderately severe depression, 20-27 = Severe depression   Psychosocial Evaluation and Intervention: Psychosocial Evaluation - 04/18/18 0744    Psychosocial Evaluation & Interventions          Interventions  Encouraged to exercise with the program and follow exercise prescription;Stress management education;Relaxation education    Comments  pt with health related stress and anxiety.      Expected Outcomes  pt will exhibit improved outlook and coping skills.     Continue Psychosocial Services   Follow up required by staff           Psychosocial Re-Evaluation:   Psychosocial Discharge (Final Psychosocial Re-Evaluation):   Vocational Rehabilitation: Provide vocational rehab assistance to qualifying candidates.   Vocational Rehab Evaluation & Intervention: Vocational Rehab - 04/09/18 1438    Initial Vocational Rehab Evaluation & Intervention          Assessment shows need for Vocational Rehabilitation  No   return to work as Geologist, engineering           Education: Education Goals: Education classes will be provided on a weekly basis, covering required topics. Participant will state understanding/return demonstration of topics presented.  Learning Barriers/Preferences:   Education Topics: Count Your Pulse:  -Group instruction provided by verbal instruction, demonstration, patient participation and written materials to support subject.  Instructors address importance of being able to find your pulse and how to count your pulse when at home without a heart monitor.  Patients get hands on experience counting their pulse with staff help and individually.   Heart Attack, Angina, and Risk Factor Modification:  -Group instruction provided by verbal instruction, video, and written materials to support subject.  Instructors address signs  and symptoms of angina and heart attacks.    Also discuss risk factors for heart disease and how to make changes to improve heart health risk factors.   Functional Fitness:  -Group instruction provided by verbal instruction, demonstration, patient participation, and written materials to support subject.  Instructors address safety measures for doing things around the house.  Discuss how to get up and down off the floor, how to pick things up properly, how to safely get out of a chair without assistance, and balance training.   Meditation and Mindfulness:  -Group instruction provided by verbal instruction, patient participation, and written materials to support subject.  Instructor addresses importance of mindfulness and meditation practice to help reduce stress and improve awareness.  Instructor also leads participants through a meditation exercise.    Stretching for Flexibility and Mobility:  -Group instruction provided by verbal instruction, patient participation, and written materials to support subject.  Instructors lead participants through series of stretches that are designed to increase flexibility thus improving mobility.  These stretches are additional exercise for major muscle groups that are typically performed during regular warm up and cool down.   Hands Only CPR:  -Group verbal, video, and participation provides a basic overview of AHA guidelines for community CPR. Role-play of emergencies allow participants the opportunity to practice calling for help and chest compression technique with discussion of AED use.   Hypertension: -Group verbal and written instruction that provides a basic overview of hypertension including the most recent diagnostic guidelines, risk factor reduction with self-care instructions and medication management.    Nutrition I class: Heart Healthy Eating:  -Group instruction provided by PowerPoint slides, verbal discussion, and written materials to  support subject matter. The instructor gives an explanation and review of the Therapeutic Lifestyle Changes diet recommendations, which includes a discussion on lipid goals, dietary fat, sodium, fiber, plant stanol/sterol esters, sugar, and the components of a well-balanced, healthy diet.   Nutrition II class: Lifestyle Skills:  -Group instruction provided by PowerPoint slides,  verbal discussion, and written materials to support subject matter. The instructor gives an explanation and review of label reading, grocery shopping for heart health, heart healthy recipe modifications, and ways to make healthier choices when eating out.   Diabetes Question & Answer:  -Group instruction provided by PowerPoint slides, verbal discussion, and written materials to support subject matter. The instructor gives an explanation and review of diabetes co-morbidities, pre- and post-prandial blood glucose goals, pre-exercise blood glucose goals, signs, symptoms, and treatment of hypoglycemia and hyperglycemia, and foot care basics.   Diabetes Blitz:  -Group instruction provided by PowerPoint slides, verbal discussion, and written materials to support subject matter. The instructor gives an explanation and review of the physiology behind type 1 and type 2 diabetes, diabetes medications and rational behind using different medications, pre- and post-prandial blood glucose recommendations and Hemoglobin A1c goals, diabetes diet, and exercise including blood glucose guidelines for exercising safely.    Portion Distortion:  -Group instruction provided by PowerPoint slides, verbal discussion, written materials, and food models to support subject matter. The instructor gives an explanation of serving size versus portion size, changes in portions sizes over the last 20 years, and what consists of a serving from each food group.   Stress Management:  -Group instruction provided by verbal instruction, video, and written  materials to support subject matter.  Instructors review role of stress in heart disease and how to cope with stress positively.     Exercising on Your Own:  -Group instruction provided by verbal instruction, power point, and written materials to support subject.  Instructors discuss benefits of exercise, components of exercise, frequency and intensity of exercise, and end points for exercise.  Also discuss use of nitroglycerin and activating EMS.  Review options of places to exercise outside of rehab.  Review guidelines for sex with heart disease.   Cardiac Drugs I:  -Group instruction provided by verbal instruction and written materials to support subject.  Instructor reviews cardiac drug classes: antiplatelets, anticoagulants, beta blockers, and statins.  Instructor discusses reasons, side effects, and lifestyle considerations for each drug class.   Cardiac Drugs II:  -Group instruction provided by verbal instruction and written materials to support subject.  Instructor reviews cardiac drug classes: angiotensin converting enzyme inhibitors (ACE-I), angiotensin II receptor blockers (ARBs), nitrates, and calcium channel blockers.  Instructor discusses reasons, side effects, and lifestyle considerations for each drug class.   Anatomy and Physiology of the Circulatory System:  Group verbal and written instruction and models provide basic cardiac anatomy and physiology, with the coronary electrical and arterial systems. Review of: AMI, Angina, Valve disease, Heart Failure, Peripheral Artery Disease, Cardiac Arrhythmia, Pacemakers, and the ICD.   Other Education:  -Group or individual verbal, written, or video instructions that support the educational goals of the cardiac rehab program.   Holiday Eating Survival Tips:  -Group instruction provided by PowerPoint slides, verbal discussion, and written materials to support subject matter. The instructor gives patients tips, tricks, and techniques to  help them not only survive but enjoy the holidays despite the onslaught of food that accompanies the holidays.   Knowledge Questionnaire Score: Knowledge Questionnaire Score - 04/09/18 1440    Knowledge Questionnaire Score          Pre Score  18/24           Core Components/Risk Factors/Patient Goals at Admission: Personal Goals and Risk Factors at Admission - 04/09/18 1433    Core Components/Risk Factors/Patient Goals on Admission  Weight Management  Obesity;Yes    Intervention  Weight Management: Develop a combined nutrition and exercise program designed to reach desired caloric intake, while maintaining appropriate intake of nutrient and fiber, sodium and fats, and appropriate energy expenditure required for the weight goal.;Weight Management: Provide education and appropriate resources to help participant work on and attain dietary goals.;Weight Management/Obesity: Establish reasonable short term and long term weight goals.;Obesity: Provide education and appropriate resources to help participant work on and attain dietary goals.    Expected Outcomes  Short Term: Continue to assess and modify interventions until short term weight is achieved;Long Term: Adherence to nutrition and physical activity/exercise program aimed toward attainment of established weight goal;Weight Loss: Understanding of general recommendations for a balanced deficit meal plan, which promotes 1-2 lb weight loss per week and includes a negative energy balance of [phone] kcal/d    Heart Failure  Yes    Intervention  Provide a combined exercise and nutrition program that is supplemented with education, support and counseling about heart failure. Directed toward relieving symptoms such as shortness of breath, decreased exercise tolerance, and extremity edema.    Expected Outcomes  Improve functional capacity of life;Short term: Daily weights obtained and reported for increase. Utilizing diuretic protocols set by  physician.;Short term: Attendance in program 2-3 days a week with increased exercise capacity. Reported lower sodium intake. Reported increased fruit and vegetable intake. Reports medication compliance.;Long term: Adoption of self-care skills and reduction of barriers for early signs and symptoms recognition and intervention leading to self-care maintenance.    Lipids  Yes    Intervention  Provide education and support for participant on nutrition & aerobic/resistive exercise along with prescribed medications to achieve LDL 70mg , HDL >40mg .    Expected Outcomes  Short Term: Participant states understanding of desired cholesterol values and is compliant with medications prescribed. Participant is following exercise prescription and nutrition guidelines.;Long Term: Cholesterol controlled with medications as prescribed, with individualized exercise RX and with personalized nutrition plan. Value goals: LDL < 70mg , HDL > 40 mg.    Stress  Yes    Intervention  Offer individual and/or small group education and counseling on adjustment to heart disease, stress management and health-related lifestyle change. Teach and support self-help strategies.;Refer participants experiencing significant psychosocial distress to appropriate mental health specialists for further evaluation and treatment. When possible, include family members and significant others in education/counseling sessions.    Expected Outcomes  Short Term: Participant demonstrates changes in health-related behavior, relaxation and other stress management skills, ability to obtain effective social support, and compliance with psychotropic medications if prescribed.;Long Term: Emotional wellbeing is indicated by absence of clinically significant psychosocial distress or social isolation.           Core Components/Risk Factors/Patient Goals Review:  Goals and Risk Factor Review    Core Components/Risk Factors/Patient Goals Review    Row Name 04/18/18  0746   Personal Goals Review  Weight Management/Obesity;Heart Failure;Lipids;Stress   Review  pt with multiple CAD RF demonstrates willingness to participate in CR program. pt encouraged to participate in exercise and education opportunities.    Expected Outcomes  pt will participate in CR exercise, nutrition and lifestyle modification opportunities to decrease overall RF.           Core Components/Risk Factors/Patient Goals at Discharge (Final Review):  Goals and Risk Factor Review - 04/18/18 0746    Core Components/Risk Factors/Patient Goals Review          Personal Goals Review  Weight Management/Obesity;Heart  Failure;Lipids;Stress    Review  pt with multiple CAD RF demonstrates willingness to participate in CR program. pt encouraged to participate in exercise and education opportunities.     Expected Outcomes  pt will participate in CR exercise, nutrition and lifestyle modification opportunities to decrease overall RF.            ITP Comments: ITP Comments    Row Name 04/09/18 1353 04/18/18 0744   ITP Comments  Dr.Traci Mayford Knife, Medical Director   30 day ITP review. pt recently started group exercise program.        Comments:

## 2018-04-19 ENCOUNTER — Encounter (HOSPITAL_COMMUNITY)
Admission: RE | Admit: 2018-04-19 | Discharge: 2018-04-19 | Disposition: A | Discharge status: Home / Self Care | Payer: 59 | Source: Ambulatory Visit | Attending: Internal Medicine | Admitting: Internal Medicine

## 2018-04-19 ENCOUNTER — Encounter (HOSPITAL_COMMUNITY): Payer: 59

## 2018-04-19 DIAGNOSIS — I214 Non-ST elevation (NSTEMI) myocardial infarction: Secondary | ICD-10-CM

## 2018-04-19 DIAGNOSIS — I5020 Unspecified systolic (congestive) heart failure: Secondary | ICD-10-CM

## 2018-04-19 DIAGNOSIS — Z713 Dietary counseling and surveillance: Secondary | ICD-10-CM | POA: Diagnosis not present

## 2018-04-22 ENCOUNTER — Encounter (HOSPITAL_COMMUNITY): Payer: 59

## 2018-04-22 ENCOUNTER — Telehealth (HOSPITAL_COMMUNITY): Payer: Self-pay | Admitting: Nurse Practitioner

## 2018-04-24 ENCOUNTER — Encounter (HOSPITAL_COMMUNITY): Payer: 59

## 2018-04-24 ENCOUNTER — Encounter (HOSPITAL_COMMUNITY)
Admission: RE | Admit: 2018-04-24 | Discharge: 2018-04-24 | Disposition: A | Discharge status: Home / Self Care | Payer: 59 | Source: Ambulatory Visit | Attending: Internal Medicine | Admitting: Internal Medicine

## 2018-04-24 DIAGNOSIS — Z713 Dietary counseling and surveillance: Secondary | ICD-10-CM | POA: Diagnosis not present

## 2018-04-24 DIAGNOSIS — I214 Non-ST elevation (NSTEMI) myocardial infarction: Secondary | ICD-10-CM

## 2018-04-24 DIAGNOSIS — I5020 Unspecified systolic (congestive) heart failure: Secondary | ICD-10-CM

## 2018-04-24 NOTE — Progress Notes (Signed)
Reviewed home exercise guidelines with patient including endpoints, temperature precautions, target heart rate and rate of perceived exertion. Pt is currently walking 1-2 miles daily as her mode of home exercise. Pt voices understanding of instructions given. Prentice Docker, BS, ACSM CEP  Artist Pais, MS, ACSM CEP

## 2018-04-26 ENCOUNTER — Encounter (HOSPITAL_COMMUNITY): Payer: 59

## 2018-04-26 ENCOUNTER — Encounter (HOSPITAL_COMMUNITY)
Admission: RE | Admit: 2018-04-26 | Discharge: 2018-04-26 | Disposition: A | Discharge status: Home / Self Care | Payer: 59 | Source: Ambulatory Visit | Attending: Internal Medicine | Admitting: Internal Medicine

## 2018-04-26 DIAGNOSIS — I214 Non-ST elevation (NSTEMI) myocardial infarction: Secondary | ICD-10-CM

## 2018-04-26 DIAGNOSIS — I5020 Unspecified systolic (congestive) heart failure: Secondary | ICD-10-CM

## 2018-04-26 DIAGNOSIS — Z713 Dietary counseling and surveillance: Secondary | ICD-10-CM | POA: Diagnosis not present

## 2018-04-29 ENCOUNTER — Encounter (HOSPITAL_COMMUNITY): Payer: 59

## 2018-04-29 ENCOUNTER — Encounter (HOSPITAL_COMMUNITY)
Admission: RE | Admit: 2018-04-29 | Discharge: 2018-04-29 | Disposition: A | Discharge status: Home / Self Care | Payer: 59 | Source: Ambulatory Visit | Attending: Internal Medicine | Admitting: Internal Medicine

## 2018-04-29 DIAGNOSIS — Z713 Dietary counseling and surveillance: Secondary | ICD-10-CM | POA: Diagnosis not present

## 2018-04-29 DIAGNOSIS — I214 Non-ST elevation (NSTEMI) myocardial infarction: Secondary | ICD-10-CM

## 2018-04-29 DIAGNOSIS — I5020 Unspecified systolic (congestive) heart failure: Secondary | ICD-10-CM

## 2018-05-01 ENCOUNTER — Encounter (HOSPITAL_COMMUNITY)
Admission: RE | Admit: 2018-05-01 | Discharge: 2018-05-01 | Disposition: A | Discharge status: Home / Self Care | Payer: 59 | Source: Ambulatory Visit | Attending: Internal Medicine | Admitting: Internal Medicine

## 2018-05-01 ENCOUNTER — Encounter (HOSPITAL_COMMUNITY): Payer: 59

## 2018-05-01 DIAGNOSIS — I214 Non-ST elevation (NSTEMI) myocardial infarction: Secondary | ICD-10-CM

## 2018-05-01 DIAGNOSIS — I5020 Unspecified systolic (congestive) heart failure: Secondary | ICD-10-CM

## 2018-05-01 DIAGNOSIS — Z713 Dietary counseling and surveillance: Secondary | ICD-10-CM | POA: Diagnosis not present

## 2018-05-03 ENCOUNTER — Encounter (HOSPITAL_COMMUNITY)
Admission: RE | Admit: 2018-05-03 | Discharge: 2018-05-03 | Disposition: A | Discharge status: Home / Self Care | Payer: 59 | Source: Ambulatory Visit | Attending: Internal Medicine | Admitting: Internal Medicine

## 2018-05-03 ENCOUNTER — Encounter (HOSPITAL_COMMUNITY): Payer: 59

## 2018-05-03 DIAGNOSIS — I214 Non-ST elevation (NSTEMI) myocardial infarction: Secondary | ICD-10-CM

## 2018-05-03 DIAGNOSIS — I5020 Unspecified systolic (congestive) heart failure: Secondary | ICD-10-CM

## 2018-05-03 DIAGNOSIS — Z713 Dietary counseling and surveillance: Secondary | ICD-10-CM | POA: Diagnosis not present

## 2018-05-06 ENCOUNTER — Encounter (HOSPITAL_COMMUNITY)
Admission: RE | Admit: 2018-05-06 | Discharge: 2018-05-06 | Disposition: A | Discharge status: Home / Self Care | Payer: 59 | Source: Ambulatory Visit | Attending: Internal Medicine | Admitting: Internal Medicine

## 2018-05-06 ENCOUNTER — Encounter (HOSPITAL_COMMUNITY): Payer: 59

## 2018-05-06 DIAGNOSIS — I5020 Unspecified systolic (congestive) heart failure: Secondary | ICD-10-CM

## 2018-05-06 DIAGNOSIS — Z713 Dietary counseling and surveillance: Secondary | ICD-10-CM | POA: Diagnosis not present

## 2018-05-06 DIAGNOSIS — I214 Non-ST elevation (NSTEMI) myocardial infarction: Secondary | ICD-10-CM

## 2018-05-08 ENCOUNTER — Encounter (HOSPITAL_COMMUNITY)
Admission: RE | Admit: 2018-05-08 | Discharge: 2018-05-08 | Disposition: A | Discharge status: Home / Self Care | Payer: 59 | Source: Ambulatory Visit | Attending: Internal Medicine | Admitting: Internal Medicine

## 2018-05-08 ENCOUNTER — Encounter (HOSPITAL_COMMUNITY): Payer: Self-pay | Admitting: *Deleted

## 2018-05-08 ENCOUNTER — Encounter (HOSPITAL_COMMUNITY): Payer: 59

## 2018-05-08 ENCOUNTER — Other Ambulatory Visit: Payer: Self-pay

## 2018-05-08 ENCOUNTER — Telehealth (HOSPITAL_COMMUNITY): Payer: Self-pay

## 2018-05-08 ENCOUNTER — Ambulatory Visit (HOSPITAL_COMMUNITY)
Admission: RE | Admit: 2018-05-08 | Discharge: 2018-05-08 | Disposition: A | Discharge status: Home / Self Care | Payer: 59 | Source: Ambulatory Visit | Attending: Internal Medicine | Admitting: Internal Medicine

## 2018-05-08 VITALS — BP 94/53 | HR 70 | Wt 145.2 lb

## 2018-05-08 DIAGNOSIS — I4510 Unspecified right bundle-branch block: Secondary | ICD-10-CM | POA: Diagnosis not present

## 2018-05-08 DIAGNOSIS — I341 Nonrheumatic mitral (valve) prolapse: Secondary | ICD-10-CM | POA: Insufficient documentation

## 2018-05-08 DIAGNOSIS — I493 Ventricular premature depolarization: Secondary | ICD-10-CM | POA: Diagnosis not present

## 2018-05-08 DIAGNOSIS — Z87891 Personal history of nicotine dependence: Secondary | ICD-10-CM | POA: Diagnosis not present

## 2018-05-08 DIAGNOSIS — R0683 Snoring: Secondary | ICD-10-CM | POA: Insufficient documentation

## 2018-05-08 DIAGNOSIS — I428 Other cardiomyopathies: Secondary | ICD-10-CM | POA: Insufficient documentation

## 2018-05-08 DIAGNOSIS — F419 Anxiety disorder, unspecified: Secondary | ICD-10-CM | POA: Diagnosis not present

## 2018-05-08 DIAGNOSIS — Z79899 Other long term (current) drug therapy: Secondary | ICD-10-CM | POA: Insufficient documentation

## 2018-05-08 DIAGNOSIS — I5022 Chronic systolic (congestive) heart failure: Secondary | ICD-10-CM | POA: Insufficient documentation

## 2018-05-08 DIAGNOSIS — I5020 Unspecified systolic (congestive) heart failure: Secondary | ICD-10-CM

## 2018-05-08 DIAGNOSIS — E785 Hyperlipidemia, unspecified: Secondary | ICD-10-CM | POA: Diagnosis not present

## 2018-05-08 DIAGNOSIS — I214 Non-ST elevation (NSTEMI) myocardial infarction: Secondary | ICD-10-CM

## 2018-05-08 DIAGNOSIS — R5383 Other fatigue: Secondary | ICD-10-CM

## 2018-05-08 LAB — BASIC METABOLIC PANEL
Anion gap: 8 (ref 5–15)
BUN: 16 mg/dL (ref 8–23)
CO2: 25 mmol/L (ref 22–32)
Calcium: 9 mg/dL (ref 8.9–10.3)
Chloride: 104 mmol/L (ref 98–111)
Creatinine, Ser: 1.31 mg/dL — ABNORMAL HIGH (ref 0.44–1.00)
GFR calc Af Amer: 48 mL/min — ABNORMAL LOW (ref >60)
GFR calc non Af Amer: 41 mL/min — ABNORMAL LOW (ref >60)
Glucose, Bld: 86 mg/dL (ref 70–99)
Potassium: 4.1 mmol/L (ref 3.5–5.1)
Sodium: 137 mmol/L (ref 135–145)

## 2018-05-08 MED ORDER — SPIRONOLACTONE 25 MG PO TABS: 25.0000 mg | ORAL_TABLET | Freq: Every day | ORAL | 6 refills | Status: DC

## 2018-05-08 NOTE — Progress Notes (Signed)
Advanced Heart Failure Clinic Note   Referring Physician: PCP: [name], NP (Inactive) PCP-Cardiologist: [name], MD   HPI: Savannah Bell is a 67 y.o. female with newly diagnosed systolic HF and frequent PVCs.   Admitted 5/28-12/16/17 with acute systolic HF. Echo showed EF 15%. R/LHC completed, which showed normal coronaries and normal LV pressures. Cardiac MRI showed EF 17% with no LGE. HF team consulted. She was noted to have frequent PVC's on tele. Loaded with IV amiodarone, then transitioned to PO. She was diuresed with IV lasix, then transitioned to lasix 20 mg daily. HF medications optimized. DC weight: 145 lbs.   She presents today for regular follow up. Zio placed last visit relatively normal with rare PVCs. Able to walk a mile or two. Now able to [place] dancing without difficulty now. Doing cardiac rehab and loving it. Denies lightheadedness or dizziness. She denies orthopnea or PND. No DOE at all. Still has a chronic dry cough. No relief from tessalon pearls and singular. No CP. She has been recommended to see a lung doctor. BP running 100-110s at home. It comes up with exercise into 120s.   Echo 03/27/18 EF remains ~ 20-25% but LV less dilated. Personally reviewed by Dr. [name]    Studies:  Echo 5/29: EF 15% with grade 1 DD, no LV thrombus, mild MR, trivial TR, PA peak pressure 31 mmHg, and trivial pericardial effusion.   R/LHC 5/29: that showed normal coronary arteries, EF 10-20%, normal LV filling pressures (no right sided pressures recorded).   Cardiac MRI 12/13/17: EF 17%, findings consistent with NICM with no evidence ofLGE to suggestinflammatory or infiltrative cardiomyopathy.   Review of systems complete and found to be negative unless listed in HPI.    Past Medical History:  Diagnosis Date  . History of blood transfusion    "when I had colon resection" (12/11/2017)  . Migraine    "stopped in the 1970s" (12/11/2017)  . Mitral valve  prolapse     Current Outpatient Medications  Medication Sig Dispense Refill  . amiodarone (PACERONE) 200 MG tablet Take 1 tablet (200 mg total) by mouth daily. 60 tablet 2  . atorvastatin (LIPITOR) 40 MG tablet TAKE 1 TABLET BY MOUTH EVERY DAY AT 6PM 90 tablet 3  . CALCIUM-VITAMIN D PO Take by mouth.    . carvedilol (COREG) 3.125 MG tablet Take 3.125 mg by mouth 2 (two) times daily with a meal.    . furosemide (LASIX) 20 MG tablet Take 1 tablet (20 mg total) by mouth daily. 30 tablet 6  . loratadine (CLARITIN) 10 MG tablet Take 10 mg by mouth daily as needed for allergies.    Marland Kitchen losartan (COZAAR) 25 MG tablet Take 1 tablet (25 mg total) by mouth at bedtime. 30 tablet 6  . spironolactone (ALDACTONE) 25 MG tablet Take 0.5 tablets (12.5 mg total) by mouth daily. 30 tablet 6   No current facility-administered medications for this encounter.    No Known Allergies  Social History   Socioeconomic History  . Marital status: Divorced    Spouse name: Not on file  . Number of children: Not on file  . Years of education: Not on file  . Highest education level: Not on file  Occupational History  . Not on file  Social Needs  . Financial resource strain: Not on file  . Food insecurity:    Worry: Not on file    Inability: Not on file  . Transportation needs:  Medical: Not on file    Non-medical: Not on file  Tobacco Use  . Smoking status: Former Smoker    Packs/day: 0.30    Years: 10.00    Pack years: 3.00    Types: Cigarettes    Last attempt to quit: 1982    Years since quitting: 37.8  . Smokeless tobacco: Former Neurosurgeon    Types: Snuff, Chew    Quit date: 2000  Substance and Sexual Activity  . Alcohol use: Not Currently  . Drug use: Never  . Sexual activity: Not Currently  Lifestyle  . Physical activity:    Days per week: Not on file    Minutes per session: Not on file  . Stress: Not on file  Relationships  . Social connections:    Talks on phone: Not on file    Gets  together: Not on file    Attends religious service: Not on file    Active member of club or organization: Not on file    Attends meetings of clubs or organizations: Not on file    Relationship status: Not on file  . Intimate partner violence:    Fear of current or ex partner: Not on file    Emotionally abused: Not on file    Physically abused: Not on file    Forced sexual activity: Not on file  Other Topics Concern  . Not on file  Social History Narrative  . Not on file    Family History  Problem Relation Age of Onset  . Sudden Cardiac Death Mother    Vitals:   May 29, 2018 1150  BP: (!) 94/53  Pulse: 70  SpO2: 100%  Weight: 65.9 kg (145 lb 3.2 oz)   Wt Readings from Last 3 Encounters:  2018-05-29 65.9 kg (145 lb 3.2 oz)  04/09/18 65 kg (143 lb 4.8 oz)  03/27/18 64.2 kg (141 lb 8 oz)   PHYSICAL EXAM: General:  Well appearing. No resp difficulty HEENT: normal Neck: supple. no JVD. Carotids 2+ bilat; no bruits. No lymphadenopathy or thryomegaly appreciated. Cor: PMI nondisplaced. Regular rate & rhythm. No rubs, gallops or murmurs. Lungs: clear Abdomen: soft, nontender, nondistended. No hepatosplenomegaly. No bruits or masses. Good bowel sounds. Extremities: no cyanosis, clubbing, rash, edema Neuro: alert & orientedx3, cranial nerves grossly intact. moves all 4 extremities w/o difficulty. Affect pleasant   ASSESSMENT & PLAN:  1. Chronic systolic HF due to NICM. Normal coronaries on [facility] 12/11/17. cMRI shows no infiltrative or inflammatory CM.No family hx of HF. No ETOH. No hx of HTN. No recent virus. She had frequent PVCs (~20%) while inpatient, which may be causing her HF. She does not snore but thinks she goes apneic. -Echo 12/12/17 EF 15% with grade 1 DD, no LV thrombus, mild MR, trivial TR, PA peak pressure 31 mmHg, and trivial pericardial effusion.  - Echo 03/27/18 EF remains ~ 20-25% but LV less dilated. Personally reviewed by Dr. [name]  - NYHA I-II - Volume status  stable on exam.    - Continue lasix 20 mg daily. BMET today.  - Increase spiro to 25 mg daily.  - Continue losartan 25 mg qhs.  - Continue coreg 3.125 mg BID. Intolerant to up-titration due to palpitations.  - Continue cardiac rehab.  - EF remains low, but improving. Will continue med titration and repeat Echo in 2 months. If remains low, will need referral to EP.   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 Pt triggered events all NSR.  -Most likely cause of her NICM - Continue amiodarone 200 mg daily. AST/ALT and TSH normal 12/25/17 - We discussed the need for annual eye exams while on amiodarone.  - Regular on exam.   3. Hyperlipidemia - Continue statin.   4. Snoring - She has been referred twice for sleep study. She states she has never heard back from them.  - Consider home sleep study once available.   5. Anxiety - Per PCP.   RTC 4 weeks for med titration. Hold off on ICD for now.   Graciella Freer, PA-C 05/08/18  Patient seen and examined with the above-signed Advanced Practice Provider and/or Housestaff. I personally reviewed laboratory data, imaging studies and relevant notes. I independently examined the patient and formulated the important aspects of the plan. I have edited the note to reflect any of my changes or salient points. I have personally discussed the plan with the patient and/or family.  Continues to improve with PVC suppression. Much more active. NYHA I. Volume status looks good. Continue to titrate GDMT with increase in spiro. Given NYHA I and improving EF will hold off on ICD for now. Repeat echo in several months - hopefully EF recovers, If/when EF recovers will need to discuss switching amio to another agent or PVC ablation to avoid long-term amio exposure. Sleep study still pending.  [name], MD  5:49 PM

## 2018-05-08 NOTE — Telephone Encounter (Signed)
Pt called and given results from heart monitor, pt stated she has an appointment today, pt asked if she needed to keep her appointment and she was told yes to still come to be seen,

## 2018-05-08 NOTE — Patient Instructions (Signed)
Increase Spironolactone to 25mg  (1 tab) daily  Labs done today  Labs in 1-2 weeks  Your physician recommends that you schedule a follow-up appointment in: 3 months with echocardiogram

## 2018-05-08 NOTE — Progress Notes (Signed)
Savannah Bell 67 y.o. female Nutrition Note Spoke with pt. Nutrition plan and goals reviewed with pt. Pt is following heart healthy diet. Pt wants to maintain weight. Wt maintenance heart healthy tips reviewed (label reading, how to build a healthy plate, portion sizes, eating frequently across the day). Pt expressed an interest in batch cooking and exploring new recipes. Discussed meal prep recipes and heart healthy recipes with patient. Showed patient web sites, and distributed heart healthy mediterranean style recipes to try. Reviewed the difference between complex and refined carbohydrates with patient, recommended pt replace refined carbs with complex ones in her diet wherever possible. Pt shared that she has switched to only whole wheat bread when she has it (rarely), and limits starches like grains and crackers. Discussed focusing on portion sizes with complex carbs to ensure adequate portion sizes at meals and snacks. Pt with dx of CHF. Per discussion, pt does not not use canned/convenience foods often. Pt does not add salt to food. Pt does not eat out frequently. Pt expressed understanding of the information reviewed. Pt aware of nutrition education classes offered and would like to attend nutrition classes.  Lab Results  Component Value Date   HGBA1C 5.7 (H) 12/11/2017    Wt Readings from Last 3 Encounters:  05/08/18 145 lb 3.2 oz (65.9 kg)  04/09/18 143 lb 4.8 oz (65 kg)  03/27/18 141 lb 8 oz (64.2 kg)    Nutrition Diagnosis ? Food-and nutrition-related knowledge deficit related to lack of exposure to information as related to diagnosis of: ? CVD   Nutrition Intervention ? Pt's individual nutrition plan reviewed with pt.  Goal(s)  ? Pt to identify and limit food sources of saturated fat, trans fat, refined carbohydrates and sodium ? Pt to try new heart healthy mediterranean style recipes  Plan:   Pt to attend nutrition classes ? Nutrition I ? Nutrition II ? Portion  Distortion   Will provide client-centered nutrition education as part of interdisciplinary care  Monitor and evaluate progress toward nutrition goal with team.    Ross Marcus, MS, RD, LDN 05/08/2018 1:51 PM

## 2018-05-10 ENCOUNTER — Encounter (HOSPITAL_COMMUNITY)
Admission: RE | Admit: 2018-05-10 | Discharge: 2018-05-10 | Disposition: A | Discharge status: Home / Self Care | Payer: 59 | Source: Ambulatory Visit | Attending: Internal Medicine | Admitting: Internal Medicine

## 2018-05-10 ENCOUNTER — Encounter (HOSPITAL_COMMUNITY): Payer: 59

## 2018-05-10 DIAGNOSIS — I5020 Unspecified systolic (congestive) heart failure: Secondary | ICD-10-CM

## 2018-05-10 DIAGNOSIS — I214 Non-ST elevation (NSTEMI) myocardial infarction: Secondary | ICD-10-CM

## 2018-05-10 DIAGNOSIS — Z713 Dietary counseling and surveillance: Secondary | ICD-10-CM | POA: Diagnosis not present

## 2018-05-13 ENCOUNTER — Encounter (HOSPITAL_COMMUNITY): Payer: 59

## 2018-05-13 ENCOUNTER — Encounter (HOSPITAL_COMMUNITY)
Admission: RE | Admit: 2018-05-13 | Discharge: 2018-05-13 | Disposition: A | Discharge status: Home / Self Care | Payer: 59 | Source: Ambulatory Visit | Attending: Internal Medicine | Admitting: Internal Medicine

## 2018-05-13 ENCOUNTER — Other Ambulatory Visit (HOSPITAL_COMMUNITY): Payer: Self-pay | Admitting: Pharmacist

## 2018-05-13 DIAGNOSIS — I214 Non-ST elevation (NSTEMI) myocardial infarction: Secondary | ICD-10-CM

## 2018-05-13 DIAGNOSIS — I5020 Unspecified systolic (congestive) heart failure: Secondary | ICD-10-CM

## 2018-05-13 DIAGNOSIS — Z713 Dietary counseling and surveillance: Secondary | ICD-10-CM | POA: Diagnosis not present

## 2018-05-13 MED ORDER — LOSARTAN POTASSIUM 25 MG PO TABS: 25.0000 mg | ORAL_TABLET | Freq: Every day | ORAL | 6 refills | Status: DC

## 2018-05-15 ENCOUNTER — Encounter (HOSPITAL_COMMUNITY)
Admission: RE | Admit: 2018-05-15 | Discharge: 2018-05-15 | Disposition: A | Discharge status: Home / Self Care | Payer: 59 | Source: Ambulatory Visit | Attending: Internal Medicine | Admitting: Internal Medicine

## 2018-05-15 ENCOUNTER — Encounter (HOSPITAL_COMMUNITY): Payer: 59

## 2018-05-15 DIAGNOSIS — I214 Non-ST elevation (NSTEMI) myocardial infarction: Secondary | ICD-10-CM

## 2018-05-15 DIAGNOSIS — I5020 Unspecified systolic (congestive) heart failure: Secondary | ICD-10-CM

## 2018-05-15 DIAGNOSIS — Z713 Dietary counseling and surveillance: Secondary | ICD-10-CM | POA: Diagnosis not present

## 2018-05-16 ENCOUNTER — Ambulatory Visit: Payer: 59

## 2018-05-16 ENCOUNTER — Encounter: Payer: Self-pay | Admitting: Internal Medicine

## 2018-05-16 ENCOUNTER — Ambulatory Visit: Payer: 59 | Admitting: Internal Medicine

## 2018-05-16 ENCOUNTER — Other Ambulatory Visit (INDEPENDENT_AMBULATORY_CARE_PROVIDER_SITE_OTHER): Payer: 59

## 2018-05-16 ENCOUNTER — Encounter (HOSPITAL_COMMUNITY): Payer: Self-pay

## 2018-05-16 ENCOUNTER — Ambulatory Visit (INDEPENDENT_AMBULATORY_CARE_PROVIDER_SITE_OTHER)
Admission: RE | Admit: 2018-05-16 | Discharge: 2018-05-16 | Disposition: A | Discharge status: Home / Self Care | Payer: 59 | Source: Ambulatory Visit | Attending: Internal Medicine | Admitting: Internal Medicine

## 2018-05-16 VITALS — BP 124/62 | HR 68 | Ht 59.0 in | Wt 144.0 lb

## 2018-05-16 DIAGNOSIS — J45991 Cough variant asthma: Secondary | ICD-10-CM

## 2018-05-16 LAB — CBC WITH DIFFERENTIAL/PLATELET
Basophils Absolute: 0 10*3/uL (ref 0.0–0.1)
Basophils Relative: 1.1 % (ref 0.0–3.0)
Eosinophils Absolute: 0 10*3/uL (ref 0.0–0.7)
Eosinophils Relative: 1.1 % (ref 0.0–5.0)
HCT: 39.6 % (ref 36.0–46.0)
Hemoglobin: 13.2 g/dL (ref 12.0–15.0)
Lymphocytes Relative: 43.7 % (ref 12.0–46.0)
Lymphs Abs: 1.6 10*3/uL (ref 0.7–4.0)
MCHC: 33.2 g/dL (ref 30.0–36.0)
MCV: 91.1 fl (ref 78.0–100.0)
Monocytes Absolute: 0.3 10*3/uL (ref 0.1–1.0)
Monocytes Relative: 9.6 % (ref 3.0–12.0)
Neutro Abs: 1.6 10*3/uL (ref 1.4–7.7)
Neutrophils Relative %: 44.5 % (ref 43.0–77.0)
Platelets: 164 10*3/uL (ref 150.0–400.0)
RBC: 4.34 Mil/uL (ref 3.87–5.11)
RDW: 14.6 % (ref 11.5–15.5)
WBC: 3.6 10*3/uL — ABNORMAL LOW (ref 4.0–10.5)

## 2018-05-16 LAB — NITRIC OXIDE: FeNO level (ppb): 7

## 2018-05-16 LAB — SEDIMENTATION RATE: Sed Rate: 9 mm/hr (ref 0–30)

## 2018-05-16 MED ORDER — PANTOPRAZOLE SODIUM 40 MG PO TBEC: 40.0000 mg | DELAYED_RELEASE_TABLET | Freq: Every day | ORAL | 2 refills | Status: DC

## 2018-05-16 MED ORDER — BENZONATATE 200 MG PO CAPS: 200.0000 mg | ORAL_CAPSULE | Freq: Three times a day (TID) | ORAL | 1 refills | Status: DC | PRN

## 2018-05-16 MED ORDER — FAMOTIDINE 20 MG PO TABS: ORAL_TABLET | ORAL | 11 refills | Status: DC

## 2018-05-16 NOTE — Progress Notes (Signed)
Cardiac Individual Treatment Plan  Patient Details  Name: Savannah Bell MRN: [id number] Date of Birth: 1951/05/18 Referring Provider:   Flowsheet Row CARDIAC REHAB PHASE II ORIENTATION from 04/09/2018 in [place]  Referring Provider  Glori Bickers, MD      Initial Encounter Date:  Flowsheet Row CARDIAC REHAB PHASE II ORIENTATION from 04/09/2018 in [place]  Date  04/09/18      Visit Diagnosis: 2/70/62 NSTEMI  Systolic congestive heart failure, unspecified HF chronicity ([place])  Patient's Home Medications on Admission:  Current Outpatient Medications:  .  amiodarone (PACERONE) 200 MG tablet, Take 1 tablet (200 mg total) by mouth daily., Disp: 60 tablet, Rfl: 2 .  atorvastatin (LIPITOR) 40 MG tablet, TAKE 1 TABLET BY MOUTH EVERY DAY AT 6PM, Disp: 90 tablet, Rfl: 3 .  CALCIUM-VITAMIN D PO, Take by mouth., Disp: , Rfl:  .  carvedilol (COREG) 3.125 MG tablet, Take 3.125 mg by mouth 2 (two) times daily with a meal., Disp: , Rfl:  .  furosemide (LASIX) 20 MG tablet, Take 1 tablet (20 mg total) by mouth daily., Disp: 30 tablet, Rfl: 6 .  loratadine (CLARITIN) 10 MG tablet, Take 10 mg by mouth daily as needed for allergies., Disp: , Rfl:  .  losartan (COZAAR) 25 MG tablet, Take 1 tablet (25 mg total) by mouth at bedtime., Disp: 30 tablet, Rfl: 6 .  spironolactone (ALDACTONE) 25 MG tablet, Take 1 tablet (25 mg total) by mouth daily., Disp: 30 tablet, Rfl: 6  Past Medical History: Past Medical History:  Diagnosis Date  . History of blood transfusion    "when I had colon resection" (12/11/2017)  . Migraine    "stopped in the 1970s" (12/11/2017)  . Mitral valve prolapse     Tobacco Use: Social History   Tobacco Use  Smoking Status Former Smoker  . Packs/day: 0.30  . Years: 10.00  . Pack years: 3.00  . Types: Cigarettes  . Last attempt to quit: 1982  . Years since quitting: 37.8  Smokeless Tobacco Former Systems developer   . Types: Snuff, Chew  . Quit date: 2000    Labs: Recent Review Flowsheet Data    Labs for ITP Cardiac and Pulmonary Rehab Latest Ref Rng & Units 09/02/2011 09/07/2011 09/11/2011 12/11/2017 12/12/2017   Cholestrol 0 - 200 mg/dL - 102 117 - 190   LDLCALC 0 - 99 mg/dL - - - - 100(H)   HDL >40 mg/dL - - - - 81   Trlycerides <150 mg/dL - 65 112 - 45   Hemoglobin A1c 4.8 - 5.6 % - - - 5.7(H) -   TCO2 0 - 100 mmol/L 23 - - - -      Capillary Blood Glucose: Lab Results  Component Value Date   GLUCAP 96 09/15/2011   GLUCAP 109 (H) 09/15/2011   GLUCAP 117 (H) 09/14/2011   GLUCAP 110 (H) 09/14/2011   GLUCAP 112 (H) 09/14/2011     Exercise Target Goals: Exercise Program Goal: Individual exercise prescription set using results from initial 6 min walk test and THRR while considering  patient's activity barriers and safety.   Exercise Prescription Goal: Initial exercise prescription builds to 30-45 minutes a day of aerobic activity, 2-3 days per week.  Home exercise guidelines will be given to patient during program as part of exercise prescription that the participant will acknowledge.  Activity Barriers & Risk Stratification: Activity Barriers & Cardiac Risk Stratification - 04/09/18 1445  Activity Barriers & Cardiac Risk Stratification          Activity Barriers  None    Cardiac Risk Stratification  High           6 Minute Walk: 6 Minute Walk    6 Minute Walk    Row Name 04/09/18 1419   Phase  Initial   Distance  1480 feet   Walk Time  6 minutes   # of Rest Breaks  0   MPH  2.8   METS  2.92   RPE  9   Perceived Dyspnea   0   VO2 Peak  10.21   Symptoms  No   Resting HR  62 bpm   Resting BP  94/60   Resting Oxygen Saturation   100 %   Exercise Oxygen Saturation  during 6 min walk  97 %   Max Ex. HR  93 bpm   Max Ex. BP  102/78   2 Minute Post BP  102/74          Oxygen Initial Assessment:   Oxygen Re-Evaluation:   Oxygen Discharge (Final Oxygen  Re-Evaluation):   Initial Exercise Prescription: Initial Exercise Prescription - 04/09/18 1400    Date of Initial Exercise RX and Referring Provider          Date  04/09/18    Referring Provider  Glori Bickers, MD    Expected Discharge Date  07/15/18        Treadmill          MPH  2.5    Grade  0    Minutes  10    METs  2.91        Bike          Level  0.7    Minutes  10    METs  3.03        NuStep          Level  2    SPM  85    Minutes  10    METs  2.5        Prescription Details          Frequency (times per week)  3    Duration  Progress to 30 minutes of continuous aerobic without signs/symptoms of physical distress        Intensity          THRR 40-80% of Max Heartrate  62-123    Ratings of Perceived Exertion  11-13    Perceived Dyspnea  0-4        Progression          Progression  Continue to progress workloads to maintain intensity without signs/symptoms of physical distress.        Resistance Training          Training Prescription  Yes    Weight  2lbs    Reps  10-15           Perform Capillary Blood Glucose checks as needed.  Exercise Prescription Changes: Exercise Prescription Changes    Response to Exercise    Row Name 04/15/18 1330 04/29/18 1122 05/15/18 1315   Blood Pressure (Admit)  92/64  110/58  104/62   Blood Pressure (Exercise)  108/70  110/62  130/80   Blood Pressure (Exit)  102/72  98/62  98/70   Heart Rate (Admit)  78 bpm  90 bpm  73 bpm   Heart Rate (Exercise)  105 bpm  118  bpm  108 bpm   Heart Rate (Exit)  78 bpm  75 bpm  67 bpm   Rating of Perceived Exertion (Exercise)  '11  12  13   '$ Symptoms  none  none  none   Duration  Progress to 30 minutes of  aerobic without signs/symptoms of physical distress  Progress to 30 minutes of  aerobic without signs/symptoms of physical distress  Progress to 30 minutes of  aerobic without signs/symptoms of physical distress   Intensity  THRR unchanged  THRR unchanged  THRR  unchanged       Progression    Row Name 04/15/18 1330 04/29/18 1122 05/15/18 1315   Progression  Continue to progress workloads to maintain intensity without signs/symptoms of physical distress.  Continue to progress workloads to maintain intensity without signs/symptoms of physical distress.  Continue to progress workloads to maintain intensity without signs/symptoms of physical distress.   Average METs  2.5  3.4  3.52       Resistance Training    Row Name 04/15/18 1330 04/29/18 1122 05/15/18 1315   Training Prescription  Yes  Yes  No   Weight  2lbs  2lbs  no documentation   Reps  10-15  10-15  no documentation   Time  10 Minutes  10 Minutes  no documentation       Interval Training    Row Name 04/15/18 1330 04/29/18 1122 05/15/18 1315   Interval Training  No  No  No       Treadmill    Row Name 04/15/18 1330 04/29/18 1122 05/15/18 1315   MPH  no documentation  no documentation  no documentation   Grade  no documentation  no documentation  no documentation   Minutes  no documentation  no documentation  no documentation   METs  no documentation  no documentation  no documentation       [place] Name 04/15/18 1330 04/29/18 1122 05/15/18 1315   Level  0.'7  1  1   '$ Minutes  '10  10  10   '$ METs  3.03  3.93  3.86       NuStep    Row Name 04/15/18 1330 04/29/18 1122 05/15/18 1315   Level  '2  2  3   '$ SPM  85  85  85   Minutes  '10  10  10   '$ METs  1.9  2.9  3.1       Track    Row Name 04/15/18 1330 04/29/18 1122 05/15/18 1315   Laps  '9  14  15   '$ Minutes  '10  10  10   '$ METs  2.57  3.44  3.6          Exercise Comments: Exercise Comments    Row Name 04/15/18 1423 04/24/18 1410 05/01/18 1612 05/16/18 0801   Exercise Comments  Patient tolerated first session of exercise well without c/o.  Reviewed home exericse guidelins, METs, and goals with patient.  Reviewed METs and goals with patient.   Pt MET level has increased. Will continue to exercise at home.       Exercise  Goals and Review: Exercise Goals    Exercise Goals    Row Name 04/09/18 1402   Increase Physical Activity  Yes   Intervention  Provide advice, education, support and counseling about physical activity/exercise needs.;Develop an individualized exercise prescription for aerobic and resistive training based on initial evaluation findings, risk stratification, comorbidities and participant's  personal goals.   Expected Outcomes  Short Term: Attend rehab on a regular basis to increase amount of physical activity.;Long Term: Exercising regularly at least 3-5 days a week.;Long Term: Add in home exercise to make exercise part of routine and to increase amount of physical activity.   Increase Strength and Stamina  Yes   Intervention  Provide advice, education, support and counseling about physical activity/exercise needs.;Develop an individualized exercise prescription for aerobic and resistive training based on initial evaluation findings, risk stratification, comorbidities and participant's personal goals.   Expected Outcomes  Short Term: Increase workloads from initial exercise prescription for resistance, speed, and METs.;Short Term: Perform resistance training exercises routinely during rehab and add in resistance training at home;Long Term: Improve cardiorespiratory fitness, muscular endurance and strength as measured by increased METs and functional capacity (6MWT)   Able to understand and use rate of perceived exertion (RPE) scale  Yes   Intervention  Provide education and explanation on how to use RPE scale   Expected Outcomes  Short Term: Able to use RPE daily in rehab to express subjective intensity level;Long Term:  Able to use RPE to guide intensity level when exercising independently   Knowledge and understanding of Target Heart Rate Range (THRR)  Yes   Intervention  Provide education and explanation of THRR including how the numbers were predicted and where they are located for reference    Expected Outcomes  Short Term: Able to state/look up THRR;Long Term: Able to use THRR to govern intensity when exercising independently;Short Term: Able to use daily as guideline for intensity in rehab   Able to check pulse independently  Yes   Intervention  Provide education and demonstration on how to check pulse in carotid and radial arteries.;Review the importance of being able to check your own pulse for safety during independent exercise   Expected Outcomes  Short Term: Able to explain why pulse checking is important during independent exercise;Long Term: Able to check pulse independently and accurately   Understanding of Exercise Prescription  Yes   Intervention  Provide education, explanation, and written materials on patient's individual exercise prescription   Expected Outcomes  Short Term: Able to explain program exercise prescription;Long Term: Able to explain home exercise prescription to exercise independently          Exercise Goals Re-Evaluation : Exercise Goals Re-Evaluation    Exercise Goal Re-Evaluation    Row Name 04/15/18 1423 04/24/18 1410 05/01/18 1610 05/16/18 0800   Exercise Goals Review  Able to understand and use rate of perceived exertion (RPE) scale;Increase Physical Activity  Able to understand and use rate of perceived exertion (RPE) scale;Increase Physical Activity;Understanding of Exercise Prescription;Knowledge and understanding of Target Heart Rate Range (THRR)  Increase Physical Activity;Increase Strength and Stamina;Able to check pulse independently;Understanding of Exercise Prescription;Knowledge and understanding of Target Heart Rate Range (THRR);Able to understand and use rate of perceived exertion (RPE) scale  Increase Physical Activity;Increase Strength and Stamina;Able to check pulse independently;Understanding of Exercise Prescription;Knowledge and understanding of Target Heart Rate Range (THRR);Able to understand and use rate of perceived exertion (RPE)  scale   Comments  Patient able to understand and use RPE scale appropriately.  Reviewed home exercise guidelines including THRR, RPE scale, and endpoints for exercise. Discussed attending pulse counting class with patient. Patient is walking 1-2 miles daily as her mode of home exercise.  Patient will continue to walking 1 to 2 miles in addition to CR exercsise.   Pt MET level has increased to 3.52.  Pt is toleratring exercise prescription well and exercising at home in addition to Cardiac Rehab.    Expected Outcomes  Increase workloads as tolerated to help improve cardiorespiratory fitness.  Patient will continue daily walking in addition to exercise at cardiac rehab to help achieve personal health and fitness goals.  Will continue to monitor and progress as tolerated.   Will continue to monitor and progress Pt as tolerated.           Discharge Exercise Prescription (Final Exercise Prescription Changes): Exercise Prescription Changes - 05/15/18 1315    Response to Exercise          Blood Pressure (Admit)  104/62    Blood Pressure (Exercise)  130/80    Blood Pressure (Exit)  98/70    Heart Rate (Admit)  73 bpm    Heart Rate (Exercise)  108 bpm    Heart Rate (Exit)  67 bpm    Rating of Perceived Exertion (Exercise)  13    Symptoms  none    Duration  Progress to 30 minutes of  aerobic without signs/symptoms of physical distress    Intensity  THRR unchanged        Progression          Progression  Continue to progress workloads to maintain intensity without signs/symptoms of physical distress.    Average METs  3.52        Resistance Training          Training Prescription  No    Weight  --    Reps  --    Time  --        Interval Training          Interval Training  No        Bike          Level  1    Minutes  10    METs  3.86        NuStep          Level  3    SPM  85    Minutes  10    METs  3.1        Track          Laps  15    Minutes  10    METs  3.6            Nutrition:  Target Goals: Understanding of nutrition guidelines, daily intake of sodium '1500mg'$ , cholesterol '200mg'$ , calories 30% from fat and 7% or less from saturated fats, daily to have 5 or more servings of fruits and vegetables.  Biometrics: Pre Biometrics - 04/09/18 1350    Pre Biometrics          Height  4' 10.75" (1.492 m)    Weight  65 kg    Waist Circumference  32 inches    Hip Circumference  41.75 inches    Waist to Hip Ratio  0.77 %    BMI (Calculated)  29.2    Triceps Skinfold  31 mm    % Body Fat  40 %    Grip Strength  29.5 kg    Flexibility  18 in    Single Leg Stand  30 seconds            Nutrition Therapy Plan and Nutrition Goals:   Nutrition Assessments:   Nutrition Goals Re-Evaluation:   Nutrition Goals Re-Evaluation:   Nutrition Goals Discharge (Final Nutrition Goals Re-Evaluation):   Psychosocial: Target Goals:  Acknowledge presence or absence of significant depression and/or stress, maximize coping skills, provide positive support system. Participant is able to verbalize types and ability to use techniques and skills needed for reducing stress and depression.  Initial Review & Psychosocial Screening: Initial Psych Review & Screening - 04/09/18 1438    Initial Review          Current issues with  Current Stress Concerns;Current Anxiety/Panic    Source of Stress Concerns  Chronic Illness;Unable to perform yard/household activities;Unable to participate in former interests or hobbies        [place]?  No        Barriers          Psychosocial barriers to participate in program  The patient should benefit from training in stress management and relaxation.        Screening Interventions          Interventions  Encouraged to exercise           Quality of Life Scores: Quality of Life - 04/09/18 1453    Quality of Life          Select  Quality of Life        Quality of Life Scores           Health/Function Pre  30 %    Socioeconomic Pre  30 %    Psych/Spiritual Pre  30 %    Family Pre  30 %    GLOBAL Pre  30 %          Scores of 19 and below usually indicate a poorer quality of life in these areas.  A difference of  2-3 points is a clinically meaningful difference.  A difference of 2-3 points in the total score of the Quality of Life Index has been associated with significant improvement in overall quality of life, self-image, physical symptoms, and general health in studies assessing change in quality of life.  PHQ-9: Recent Review Flowsheet Data    There is no flowsheet data to display.     Interpretation of Total Score  Total Score Depression Severity:  1-4 = Minimal depression, 5-9 = Mild depression, 10-14 = Moderate depression, 15-19 = Moderately severe depression, 20-27 = Severe depression   Psychosocial Evaluation and Intervention: Psychosocial Evaluation - 04/18/18 0744    Psychosocial Evaluation & Interventions          Interventions  Encouraged to exercise with the program and follow exercise prescription;Stress management education;Relaxation education    Comments  pt with health related stress and anxiety.      Expected Outcomes  pt will exhibit improved outlook and coping skills.     Continue Psychosocial Services   Follow up required by staff           Psychosocial Re-Evaluation: Psychosocial Re-Evaluation    Psychosocial Re-Evaluation    [place] Name 05/16/18 1152   Current issues with  Current Stress Concerns;Current Anxiety/Panic   Comments  pt with health related stress and anxiety verbalizes improved confidence in her CR participation.     Expected Outcomes  pt will exhibit positive outlook with good coping skills.    Interventions  Encouraged to attend Cardiac Rehabilitation for the exercise;Stress management education;Relaxation education       Initial Review    Row Name 05/16/18 1152   Source of Stress Concerns  Chronic Illness;Unable  to perform yard/household activities;Unable to participate  in former interests or hobbies          Psychosocial Discharge (Final Psychosocial Re-Evaluation): Psychosocial Re-Evaluation - 05/16/18 1152    Psychosocial Re-Evaluation          Current issues with  Current Stress Concerns;Current Anxiety/Panic    Comments  pt with health related stress and anxiety verbalizes improved confidence in her CR participation.      Expected Outcomes  pt will exhibit positive outlook with good coping skills.     Interventions  Encouraged to attend Cardiac Rehabilitation for the exercise;Stress management education;Relaxation education        Initial Review          Source of Stress Concerns  Chronic Illness;Unable to perform yard/household activities;Unable to participate in former interests or hobbies           Vocational Rehabilitation: Provide vocational rehab assistance to qualifying candidates.   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 Participant will state understanding/return demonstration of topics presented.  Learning Barriers/Preferences:   Education Topics: Count Your Pulse:  -Group instruction provided by verbal instruction, demonstration, patient participation and written materials to support subject.  Instructors address importance of being able to find your pulse and how to count your pulse when at home without a heart monitor.  Patients get hands on experience counting their pulse with staff help and individually. Flowsheet Row CARDIAC REHAB PHASE II EXERCISE from 05/10/2018 in [place]  Date  05/10/18  Instruction Review Code  2-  Demonstrated Understanding      Heart Attack, Angina, and Risk Factor Modification:  -Group instruction provided by verbal instruction, video, and written materials to support subject.  Instructors address signs and symptoms of angina and heart attacks.    Also discuss risk factors for heart disease and how to make changes to improve heart health risk factors. Flowsheet Row CARDIAC REHAB PHASE II EXERCISE from 05/10/2018 in [place]  Date  04/17/18  Instruction Review Code  2- Demonstrated Understanding      Functional Fitness:  -Group instruction provided by verbal instruction, demonstration, patient participation, and written materials to support subject.  Instructors address safety measures for doing things around the house.  Discuss how to get up and down off the floor, how to pick things up properly, how to safely get out of a chair without assistance, and balance training.   Meditation and Mindfulness:  -Group instruction provided by verbal instruction, patient participation, and written materials to support subject.  Instructor addresses importance of mindfulness and meditation practice to help reduce stress and improve awareness.  Instructor also leads participants through a meditation exercise.  Flowsheet Row CARDIAC REHAB PHASE II EXERCISE from 05/10/2018 in [place]  Date  05/08/18  Educator  RN  Instruction Review Code  2- Demonstrated Understanding      Stretching for Flexibility and Mobility:  -Group instruction provided by verbal instruction, patient participation, and written materials to support subject.  Instructors lead participants through series of stretches that are designed to increase flexibility thus improving mobility.  These stretches are  additional exercise for major muscle groups that are typically performed during regular warm up and cool down.   Hands Only CPR:  -Group verbal, video, and  participation provides a basic overview of AHA guidelines for community CPR. Role-play of emergencies allow participants the opportunity to practice calling for help and chest compression technique with discussion of AED use.   Hypertension: -Group verbal and written instruction that provides a basic overview of hypertension including the most recent diagnostic guidelines, risk factor reduction with self-care instructions and medication management.    Nutrition I class: Heart Healthy Eating:  -Group instruction provided by PowerPoint slides, verbal discussion, and written materials to support subject matter. The instructor gives an explanation and review of the Therapeutic Lifestyle Changes diet recommendations, which includes a discussion on lipid goals, dietary fat, sodium, fiber, plant stanol/sterol esters, sugar, and the components of a well-balanced, healthy diet.   Nutrition II class: Lifestyle Skills:  -Group instruction provided by PowerPoint slides, verbal discussion, and written materials to support subject matter. The instructor gives an explanation and review of label reading, grocery shopping for heart health, heart healthy recipe modifications, and ways to make healthier choices when eating out.   Diabetes Question & Answer:  -Group instruction provided by PowerPoint slides, verbal discussion, and written materials to support subject matter. The instructor gives an explanation and review of diabetes co-morbidities, pre- and post-prandial blood glucose goals, pre-exercise blood glucose goals, signs, symptoms, and treatment of hypoglycemia and hyperglycemia, and foot care basics.   Diabetes Blitz:  -Group instruction provided by PowerPoint slides, verbal discussion, and written materials to support subject matter. The instructor gives an explanation and review of the physiology behind type 1 and type 2 diabetes, diabetes medications and rational behind using different medications,  pre- and post-prandial blood glucose recommendations and Hemoglobin A1c goals, diabetes diet, and exercise including blood glucose guidelines for exercising safely.    Portion Distortion:  -Group instruction provided by PowerPoint slides, verbal discussion, written materials, and food models to support subject matter. The instructor gives an explanation of serving size versus portion size, changes in portions sizes over the last 20 years, and what consists of a serving from each food group.   Stress Management:  -Group instruction provided by verbal instruction, video, and written materials to support subject matter.  Instructors review role of stress in heart disease and how to cope with stress positively.     Exercising on Your Own:  -Group instruction provided by verbal instruction, power point, and written materials to support subject.  Instructors discuss benefits of exercise, components of exercise, frequency and intensity of exercise, and end points for exercise.  Also discuss use of nitroglycerin and activating EMS.  Review options of places to exercise outside of rehab.  Review guidelines for sex with heart disease. Flowsheet Row CARDIAC REHAB PHASE II EXERCISE from 05/10/2018 in [place]  Date  05/01/18  Instruction Review Code  2- Demonstrated Understanding      Cardiac Drugs I:  -Group instruction provided by verbal instruction and written materials to support subject.  Instructor reviews cardiac drug classes: antiplatelets, anticoagulants, beta blockers, and statins.  Instructor discusses reasons, side effects, and lifestyle considerations for each drug class.   Cardiac Drugs II:  -Group instruction provided by verbal instruction and written materials to support subject.  Instructor reviews cardiac drug classes: angiotensin converting enzyme inhibitors (ACE-I), angiotensin II receptor blockers (ARBs), nitrates, and calcium channel blockers.   Instructor discusses reasons, side  effects, and lifestyle considerations for each drug class. Flowsheet Row CARDIAC REHAB PHASE II EXERCISE from 05/10/2018 in [facility]  Date  04/24/18  Educator  Pharmacist  Instruction Review Code  2- Demonstrated Understanding      Anatomy and Physiology of the Circulatory System:  Group verbal and written instruction and models provide basic cardiac anatomy and physiology, with the coronary electrical and arterial systems. Review of: AMI, Angina, Valve disease, Heart Failure, Peripheral Artery Disease, Cardiac Arrhythmia, Pacemakers, and the ICD.   Other Education:  -Group or individual verbal, written, or video instructions that support the educational goals of the cardiac rehab program.   Holiday Eating Survival Tips:  -Group instruction provided by PowerPoint slides, verbal discussion, and written materials to support subject matter. The instructor gives patients tips, tricks, and techniques to help them not only survive but enjoy the holidays despite the onslaught of food that accompanies the holidays.   Knowledge Questionnaire Score: Knowledge Questionnaire Score - 04/09/18 1440    Knowledge Questionnaire Score          Pre Score  18/24           Core Components/Risk Factors/Patient Goals at Admission: Personal Goals and Risk Factors at Admission - 04/09/18 1433    Core Components/Risk Factors/Patient Goals on Admission           Weight Management  Obesity;Yes    Intervention  Weight Management: Develop a combined nutrition and exercise program designed to reach desired caloric intake, while maintaining appropriate intake of nutrient and fiber, sodium and fats, and appropriate energy expenditure required for the weight goal.;Weight Management: Provide education and appropriate resources to help participant work on and attain dietary goals.;Weight Management/Obesity: Establish reasonable short term and long  term weight goals.;Obesity: Provide education and appropriate resources to help participant work on and attain dietary goals.    Expected Outcomes  Short Term: Continue to assess and modify interventions until short term weight is achieved;Long Term: Adherence to nutrition and physical activity/exercise program aimed toward attainment of established weight goal;Weight Loss: Understanding of general recommendations for a balanced deficit meal plan, which promotes 1-2 lb weight loss per week and includes a negative energy balance of [phone] kcal/d    Heart Failure  Yes    Intervention  Provide a combined exercise and nutrition program that is supplemented with education, support and counseling about heart failure. Directed toward relieving symptoms such as shortness of breath, decreased exercise tolerance, and extremity edema.    Expected Outcomes  Improve functional capacity of life;Short term: Daily weights obtained and reported for increase. Utilizing diuretic protocols set by physician.;Short term: Attendance in program 2-3 days a week with increased exercise capacity. Reported lower sodium intake. Reported increased fruit and vegetable intake. Reports medication compliance.;Long term: Adoption of self-care skills and reduction of barriers for early signs and symptoms recognition and intervention leading to self-care maintenance.    Lipids  Yes    Intervention  Provide education and support for participant on nutrition & aerobic/resistive exercise along with prescribed medications to achieve LDL '70mg'$ , HDL >'40mg'$ .    Expected Outcomes  Short Term: Participant states understanding of desired cholesterol values and is compliant with medications prescribed. Participant is following exercise prescription and nutrition guidelines.;Long Term: Cholesterol controlled with medications as prescribed, with individualized exercise RX and with personalized nutrition plan. Value goals: LDL < '70mg'$ , HDL > 40 mg.    Stress   Yes    Intervention  Offer individual  and/or small group education and counseling on adjustment to heart disease, stress management and health-related lifestyle change. Teach and support self-help strategies.;Refer participants experiencing significant psychosocial distress to appropriate mental health specialists for further evaluation and treatment. When possible, include family members and significant others in education/counseling sessions.    Expected Outcomes  Short Term: Participant demonstrates changes in health-related behavior, relaxation and other stress management skills, ability to obtain effective social support, and compliance with psychotropic medications if prescribed.;Long Term: Emotional wellbeing is indicated by absence of clinically significant psychosocial distress or social isolation.           Core Components/Risk Factors/Patient Goals Review:  Goals and Risk Factor Review    Core Components/Risk Factors/Patient Goals Review    Row Name 04/18/18 0746 05/16/18 1154   Personal Goals Review  Weight Management/Obesity;Heart Failure;Lipids;Stress  Weight Management/Obesity;Heart Failure;Lipids;Stress   Review  pt with multiple CAD RF demonstrates willingness to participate in CR program. pt encouraged to participate in exercise and education opportunities.   pt with multiple CAD RF demonstrates willingness to participate in CR program. pt encouraged to participate in exercise and education opportunities. pt reports consistency in HEP and eagerness to maintain healthy lifestyle.    Expected Outcomes  pt will participate in CR exercise, nutrition and lifestyle modification opportunities to decrease overall RF.   pt will participate in CR exercise, nutrition and lifestyle modification opportunities to decrease overall RF.           Core Components/Risk Factors/Patient Goals at Discharge (Final Review):  Goals and Risk Factor Review - 05/16/18 1154    Core Components/Risk  Factors/Patient Goals Review          Personal Goals Review  Weight Management/Obesity;Heart Failure;Lipids;Stress    Review  pt with multiple CAD RF demonstrates willingness to participate in CR program. pt encouraged to participate in exercise and education opportunities. pt reports consistency in HEP and eagerness to maintain healthy lifestyle.     Expected Outcomes  pt will participate in CR exercise, nutrition and lifestyle modification opportunities to decrease overall RF.            ITP Comments: ITP Comments    Row Name 04/09/18 1353 04/18/18 0744 05/16/18 1151   ITP Comments  Dr.Traci Turner, Medical Director   30 day ITP review. pt recently started group exercise program.    30 day ITP review. pt with good attendance and participation. pt demonstrates willingness to participate in CR program.       Comments:

## 2018-05-16 NOTE — Progress Notes (Signed)
Savannah Bell, female    DOB: 1950/10/10,   MRN: [id number]    Brief patient profile:  36 yobf  Quit smoking 1983 s resp symptoms  with  Onset sinus problems while living in same home in  [place] around 2013   with springtime = fall sniffling/ sneezing/ congestion > rx by PCP with flonase and improved but returned every season sometimes with cough better with clariton prn but then Aug 2019 no better with clariton or robitussin / prescription cough meds and  Persisted daily  so referred to pulmonary clinic 05/16/2018 by Dr Nicki Reaper with h/o chf but no improvement in cough with adequate diuresis.   05/16/2018  Pulmonary/ 1st office eval  Chief Complaint  Patient presents with  . Pulmonary Consult    Referred by Dr. Nicki Reaper. Pt c/o cough x 3 months- non prod and with no specific trigger.   Dyspnea:  Not limited by breathing from desired activities   Cough: indolent onset persistent cough since early aug 2019 not present sleeping  s assoc flare of  sniffles  Sleep: able lie flat  SABA use: none  No obvious day to day or daytime variability or assoc excess/ purulent sputum or mucus plugs or hemoptysis or cp or chest tightness, subjective wheeze or overt sinus or hb symptoms.   Sleeping as above  without nocturnal  or early am exacerbation  of respiratory  c/o's or need for noct saba. Also denies any obvious fluctuation of symptoms with weather or environmental changes or other aggravating or alleviating factors except as outlined above   No unusual exposure hx or h/o childhood pna/ asthma or knowledge of premature birth.  Current Allergies, Complete Past Medical History, Past Surgical History, Family History, and Social History were reviewed in [employer] record.  ROS  The following are not active complaints unless bolded Hoarseness, sore throat, dysphagia, dental problems, itching, sneezing,  nasal congestion or discharge of excess mucus or purulent secretions, ear  ache,   fever, chills, sweats, unintended wt loss or wt gain, classically pleuritic or exertional cp,  orthopnea pnd or arm/hand swelling  or leg swelling, presyncope, palpitations, abdominal pain, anorexia, nausea, vomiting, diarrhea  or change in bowel habits or change in bladder habits, change in stools or change in urine, dysuria, hematuria,  rash, arthralgias, visual complaints, headache, numbness, weakness or ataxia or problems with walking or coordination,  change in mood or  memory.           Past Medical History:  Diagnosis Date  . History of blood transfusion    "when I had colon resection" (12/11/2017)  . Migraine    "stopped in the 1970s" (12/11/2017)  . Mitral valve prolapse     Outpatient Medications Prior to Visit  Medication Sig Dispense Refill  . amiodarone (PACERONE) 200 MG tablet Take 1 tablet (200 mg total) by mouth daily. 60 tablet 2  . atorvastatin (LIPITOR) 40 MG tablet TAKE 1 TABLET BY MOUTH EVERY DAY AT 6PM 90 tablet 3  . CALCIUM-VITAMIN D PO Take by mouth.    . carvedilol (COREG) 3.125 MG tablet Take 3.125 mg by mouth 2 (two) times daily with a meal.    . furosemide (LASIX) 20 MG tablet Take 1 tablet (20 mg total) by mouth daily. 30 tablet 6  . loratadine (CLARITIN) 10 MG tablet Take 10 mg by mouth daily as needed for allergies.    Marland Kitchen losartan (COZAAR) 25 MG tablet Take 1 tablet (25 mg total)  by mouth at bedtime. 30 tablet 6  . spironolactone (ALDACTONE) 25 MG tablet Take 1 tablet (25 mg total) by mouth daily. 30 tablet 6                Objective:     BP 124/62 (BP Location: Left Arm, Cuff Size: Normal)   Pulse 68   Ht _0  (1.499 m)   Wt 144 lb (65.3 kg)   SpO2 98%   BMI 29.08 kg/m   SpO2: 98 %   RA  Pleasant amb bf nad   HEENT: nl dentition, turbinates bilaterally  - mild/mod cobblestoning of  Oropharynx s excess mucus . Nl external ear canals without cough reflex   NECK :  without JVD/Nodes/TM/ nl carotid upstrokes bilaterally   LUNGS:  no acc muscle use,  Nl contour chest which is clear to A and P bilaterally without cough on insp or exp maneuvers   CV:  RRR  no s3 or murmur or increase in P2, and no edema   ABD:  soft and nontender with nl inspiratory excursion in the supine position. No bruits or organomegaly appreciated, bowel sounds nl  MS:  Nl gait/ ext warm without deformities, calf tenderness, cyanosis or clubbing No obvious joint restrictions   SKIN: warm and dry without lesions    NEURO:  alert, approp, nl sensorium with  no motor or cerebellar deficits apparent.    Labs ordered 05/16/2018   Allergy profile    Labs ordered/ reviewed:  Lab Results  Component Value Date   WBC 3.6 (L) 05/16/2018   HGB 13.2 05/16/2018   HCT 39.6 05/16/2018   MCV 91.1 05/16/2018   PLT 164.0 05/16/2018       EOS                                                              0.0                                     05/16/2018       Lab Results  Component Value Date   ESRSEDRATE 9 05/16/2018   ESRSEDRATE 3 12/13/2017        CXR PA and Lateral:   05/16/2018 :    I personally reviewed images  - radiology impression as follows:   Mild cm s chf    CT chest 12/11/17 c/w chf        Assessment   Cough variant asthma vs uacs  FENO 05/16/2018  =   7 - Allergy profile 05/16/2018 >  Eos 0.0 /  IgE  Pending  - Spirometry 05/16/2018  FEV1 1.9 (126%)  Ratio 82 s prior rx/ no curvature - 05/16/2018 rx for gerd     The most common causes of chronic cough in immunocompetent adults include the following: upper airway cough syndrome (UACS), previously referred to as postnasal drip syndrome (PNDS), which is caused by variety of rhinosinus conditions; (2) asthma; (3) GERD; (4) chronic bronchitis from cigarette smoking or other inhaled environmental irritants; (5) nonasthmatic eosinophilic bronchitis; and (6) bronchiectasis.   These conditions, singly or in combination, have accounted for up to 94% of the causes of chronic cough in  prospective  studies.   Other conditions have constituted no >6% of the causes in prospective studies These have included bronchogenic carcinoma, chronic interstitial pneumonia(for which she is at risk on Amiodarone which is less likely with nl esr) , sarcoidosis, left ventricular failure which she clearly has but is well compensated clinically , ACEI-induced cough, and aspiration from a condition associated with pharyngeal dysfunction.    Chronic cough is often simultaneously caused by more than one condition. A single cause has been found from 38 to 82% of the time, multiple causes from 18 to 62%. Multiply caused cough has been the result of three diseases up to 42% of the time.    However, Of the three most common causes of  Sub-acute / recurrent or chronic cough, only one (GERD)  can actually contribute to/ trigger  the other two (asthma and post nasal drip syndrome)  and perpetuate the cylce of cough.  While not intuitively obvious, many patients with chronic low grade reflux do not cough until there is a primary insult that disturbs the protective epithelial barrier and exposes sensitive nerve endings.   This is typically viral but can due to PNDS and  either may apply here.     >>>>  The point is that once this occurs, it is difficult to eliminate the cycle  using anything but a maximally effective acid suppression regimen at least in the short run, accompanied by an appropriate diet to address non acid GERD and control / eliminate the cough itself for at least 3 days with tessalon and eliminate pnds with zyrtec and if not effective change to 1st gen H1 blockers per guidelines.    Total time devoted to counseling  > 50 % of initial 60 min office visit:  review case with pt/ discussion of options/alternatives/ personally creating written customized instructions  in presence of pt  then going over those specific  Instructions directly with the pt including how to use all of the meds but in  particular covering each new medication in detail and the difference between the maintenance= "automatic" meds and the prns using an action plan format for the latter (If this problem/symptom => do that organization reading Left to right).  Please see AVS from this visit for a full list of these instructions which I personally wrote for this pt and  are unique to this visit.                   Christinia Gully, MD 05/16/2018

## 2018-05-16 NOTE — Patient Instructions (Addendum)
Stop clariton and start zyrtec 10 mg at bedtime   For cough >  Tessalon pearls 200 mg every 8 hours as needed   Pantoprazole (protonix) 40 mg   Take  30-60 min before first meal of the day and Pepcid (famotidine)  20 mg one @  bedtime until return to office - this is the best way to tell whether stomach acid is contributing to your problem.    GERD (REFLUX)  is an extremely common cause of respiratory symptoms just like yours , many times with no obvious heartburn at all.    It can be treated with medication, but also with lifestyle changes including elevation of the head of your bed (ideally with 6 inch  bed blocks),  Smoking cessation, avoidance of late meals, excessive alcohol, and avoid fatty foods, chocolate, peppermint, colas, red wine, and acidic juices such as orange juice.  NO MINT OR MENTHOL PRODUCTS SO NO COUGH DROPS   USE SUGARLESS CANDY INSTEAD (Jolley ranchers or Stover's or Life Savers) or even ice chips will also do - the key is to swallow to prevent all throat clearing. NO OIL BASED VITAMINS - use powdered substitutes.   Please remember to go to the lab and x-ray department downstairs in the basement  for your tests - we will call you with the results when they are available.    Please schedule a follow up office visit in 4 weeks, sooner if needed

## 2018-05-17 ENCOUNTER — Encounter (HOSPITAL_COMMUNITY): Payer: 59

## 2018-05-17 ENCOUNTER — Encounter: Payer: Self-pay | Admitting: Internal Medicine

## 2018-05-17 ENCOUNTER — Encounter (HOSPITAL_COMMUNITY)
Admission: RE | Admit: 2018-05-17 | Discharge: 2018-05-17 | Disposition: A | Discharge status: Home / Self Care | Payer: 59 | Source: Ambulatory Visit | Attending: Internal Medicine | Admitting: Internal Medicine

## 2018-05-17 DIAGNOSIS — I5020 Unspecified systolic (congestive) heart failure: Secondary | ICD-10-CM

## 2018-05-17 DIAGNOSIS — I509 Heart failure, unspecified: Secondary | ICD-10-CM | POA: Diagnosis present

## 2018-05-17 DIAGNOSIS — I214 Non-ST elevation (NSTEMI) myocardial infarction: Secondary | ICD-10-CM

## 2018-05-17 DIAGNOSIS — Z713 Dietary counseling and surveillance: Secondary | ICD-10-CM | POA: Insufficient documentation

## 2018-05-17 LAB — RESPIRATORY ALLERGY PROFILE REGION II ~~LOC~~

## 2018-05-17 LAB — INTERPRETATION:

## 2018-05-17 NOTE — Progress Notes (Signed)
Spoke with pt and notified of results per Dr. Wert. Pt verbalized understanding and denied any questions. 

## 2018-05-17 NOTE — Assessment & Plan Note (Addendum)
FENO 05/16/2018  =   7 - Allergy profile 05/16/2018 >  Eos 0.0 /  IgE  Pending  - Spirometry 05/16/2018  FEV1 1.9 (126%)  Ratio 82 s prior rx/ no curvature - 05/16/2018 rx for gerd     The most common causes of chronic cough in immunocompetent adults include the following: upper airway cough syndrome (UACS), previously referred to as postnasal drip syndrome (PNDS), which is caused by variety of rhinosinus conditions; (2) asthma; (3) GERD; (4) chronic bronchitis from cigarette smoking or other inhaled environmental irritants; (5) nonasthmatic eosinophilic bronchitis; and (6) bronchiectasis.   These conditions, singly or in combination, have accounted for up to 94% of the causes of chronic cough in prospective studies.   Other conditions have constituted no >6% of the causes in prospective studies These have included bronchogenic carcinoma, chronic interstitial pneumonia(for which she is at risk on Amiodarone which is less likely with nl esr) , sarcoidosis, left ventricular failure which she clearly has but is well compensated clinically , ACEI-induced cough, and aspiration from a condition associated with pharyngeal dysfunction.    Chronic cough is often simultaneously caused by more than one condition. A single cause has been found from 38 to 82% of the time, multiple causes from 18 to 62%. Multiply caused cough has been the result of three diseases up to 42% of the time.    However, Of the three most common causes of  Sub-acute / recurrent or chronic cough, only one (GERD)  can actually contribute to/ trigger  the other two (asthma and post nasal drip syndrome)  and perpetuate the cylce of cough.  While not intuitively obvious, many patients with chronic low grade reflux do not cough until there is a primary insult that disturbs the protective epithelial barrier and exposes sensitive nerve endings.   This is typically viral but can due to PNDS and  either may apply here.     >>>>  The point is  that once this occurs, it is difficult to eliminate the cycle  using anything but a maximally effective acid suppression regimen at least in the short run, accompanied by an appropriate diet to address non acid GERD and control / eliminate the cough itself for at least 3 days with tessalon and eliminate pnds with zyrtec and if not effective change to 1st gen H1 blockers per guidelines.    Total time devoted to counseling  > 50 % of initial 60 min office visit:  review case with pt/ discussion of options/alternatives/ personally creating written customized instructions  in presence of pt  then going over those specific  Instructions directly with the pt including how to use all of the meds but in particular covering each new medication in detail and the difference between the maintenance= "automatic" meds and the prns using an action plan format for the latter (If this problem/symptom => do that organization reading Left to right).  Please see AVS from this visit for a full list of these instructions which I personally wrote for this pt and  are unique to this visit.

## 2018-05-20 ENCOUNTER — Encounter (HOSPITAL_COMMUNITY)
Admission: RE | Admit: 2018-05-20 | Discharge: 2018-05-20 | Disposition: A | Discharge status: Home / Self Care | Payer: 59 | Source: Ambulatory Visit | Attending: Internal Medicine | Admitting: Internal Medicine

## 2018-05-20 ENCOUNTER — Encounter (HOSPITAL_COMMUNITY): Payer: 59

## 2018-05-20 ENCOUNTER — Other Ambulatory Visit (HOSPITAL_COMMUNITY): Payer: 59

## 2018-05-20 DIAGNOSIS — I214 Non-ST elevation (NSTEMI) myocardial infarction: Secondary | ICD-10-CM

## 2018-05-20 DIAGNOSIS — Z713 Dietary counseling and surveillance: Secondary | ICD-10-CM | POA: Diagnosis not present

## 2018-05-20 DIAGNOSIS — I5020 Unspecified systolic (congestive) heart failure: Secondary | ICD-10-CM

## 2018-05-22 ENCOUNTER — Encounter (HOSPITAL_COMMUNITY)
Admission: RE | Admit: 2018-05-22 | Discharge: 2018-05-22 | Disposition: A | Discharge status: Home / Self Care | Payer: 59 | Source: Ambulatory Visit | Attending: Internal Medicine | Admitting: Internal Medicine

## 2018-05-22 ENCOUNTER — Encounter (HOSPITAL_COMMUNITY): Payer: 59

## 2018-05-22 DIAGNOSIS — Z713 Dietary counseling and surveillance: Secondary | ICD-10-CM | POA: Diagnosis not present

## 2018-05-22 DIAGNOSIS — I5020 Unspecified systolic (congestive) heart failure: Secondary | ICD-10-CM

## 2018-05-22 DIAGNOSIS — I214 Non-ST elevation (NSTEMI) myocardial infarction: Secondary | ICD-10-CM

## 2018-05-24 ENCOUNTER — Encounter (HOSPITAL_COMMUNITY): Payer: 59

## 2018-05-24 ENCOUNTER — Encounter (HOSPITAL_COMMUNITY)
Admission: RE | Admit: 2018-05-24 | Discharge: 2018-05-24 | Disposition: A | Discharge status: Home / Self Care | Payer: 59 | Source: Ambulatory Visit | Attending: Internal Medicine | Admitting: Internal Medicine

## 2018-05-24 DIAGNOSIS — I214 Non-ST elevation (NSTEMI) myocardial infarction: Secondary | ICD-10-CM

## 2018-05-24 DIAGNOSIS — I5020 Unspecified systolic (congestive) heart failure: Secondary | ICD-10-CM

## 2018-05-24 DIAGNOSIS — Z713 Dietary counseling and surveillance: Secondary | ICD-10-CM | POA: Diagnosis not present

## 2018-05-27 ENCOUNTER — Encounter (HOSPITAL_COMMUNITY): Payer: 59

## 2018-05-27 ENCOUNTER — Encounter (HOSPITAL_COMMUNITY)
Admission: RE | Admit: 2018-05-27 | Discharge: 2018-05-27 | Disposition: A | Discharge status: Home / Self Care | Payer: 59 | Source: Ambulatory Visit | Attending: Internal Medicine | Admitting: Internal Medicine

## 2018-05-27 DIAGNOSIS — Z713 Dietary counseling and surveillance: Secondary | ICD-10-CM | POA: Diagnosis not present

## 2018-05-29 ENCOUNTER — Encounter (HOSPITAL_COMMUNITY): Payer: 59

## 2018-05-29 ENCOUNTER — Encounter (HOSPITAL_COMMUNITY)
Admission: RE | Admit: 2018-05-29 | Discharge: 2018-05-29 | Disposition: A | Discharge status: Home / Self Care | Payer: 59 | Source: Ambulatory Visit | Attending: Internal Medicine | Admitting: Internal Medicine

## 2018-05-29 DIAGNOSIS — Z713 Dietary counseling and surveillance: Secondary | ICD-10-CM | POA: Diagnosis not present

## 2018-05-29 DIAGNOSIS — I214 Non-ST elevation (NSTEMI) myocardial infarction: Secondary | ICD-10-CM

## 2018-05-29 DIAGNOSIS — I5020 Unspecified systolic (congestive) heart failure: Secondary | ICD-10-CM

## 2018-05-31 ENCOUNTER — Encounter (HOSPITAL_COMMUNITY)
Admission: RE | Admit: 2018-05-31 | Discharge: 2018-05-31 | Disposition: A | Discharge status: Home / Self Care | Payer: 59 | Source: Ambulatory Visit | Attending: Internal Medicine | Admitting: Internal Medicine

## 2018-05-31 ENCOUNTER — Encounter (HOSPITAL_COMMUNITY): Payer: 59

## 2018-05-31 DIAGNOSIS — I5020 Unspecified systolic (congestive) heart failure: Secondary | ICD-10-CM

## 2018-05-31 DIAGNOSIS — I214 Non-ST elevation (NSTEMI) myocardial infarction: Secondary | ICD-10-CM

## 2018-05-31 DIAGNOSIS — Z713 Dietary counseling and surveillance: Secondary | ICD-10-CM | POA: Diagnosis not present

## 2018-06-03 ENCOUNTER — Encounter (HOSPITAL_COMMUNITY): Payer: 59

## 2018-06-03 ENCOUNTER — Encounter (HOSPITAL_COMMUNITY)
Admission: RE | Admit: 2018-06-03 | Discharge: 2018-06-03 | Disposition: A | Discharge status: Home / Self Care | Payer: 59 | Source: Ambulatory Visit | Attending: Internal Medicine | Admitting: Internal Medicine

## 2018-06-03 DIAGNOSIS — Z713 Dietary counseling and surveillance: Secondary | ICD-10-CM | POA: Diagnosis not present

## 2018-06-03 DIAGNOSIS — I214 Non-ST elevation (NSTEMI) myocardial infarction: Secondary | ICD-10-CM

## 2018-06-03 DIAGNOSIS — I5020 Unspecified systolic (congestive) heart failure: Secondary | ICD-10-CM

## 2018-06-05 ENCOUNTER — Encounter (HOSPITAL_COMMUNITY): Payer: 59

## 2018-06-05 ENCOUNTER — Encounter (HOSPITAL_COMMUNITY)
Admission: RE | Admit: 2018-06-05 | Discharge: 2018-06-05 | Disposition: A | Discharge status: Home / Self Care | Payer: 59 | Source: Ambulatory Visit | Attending: Internal Medicine | Admitting: Internal Medicine

## 2018-06-05 ENCOUNTER — Other Ambulatory Visit: Payer: Self-pay | Admitting: Physician Assistant

## 2018-06-05 DIAGNOSIS — I214 Non-ST elevation (NSTEMI) myocardial infarction: Secondary | ICD-10-CM

## 2018-06-05 DIAGNOSIS — I5020 Unspecified systolic (congestive) heart failure: Secondary | ICD-10-CM

## 2018-06-05 DIAGNOSIS — Z713 Dietary counseling and surveillance: Secondary | ICD-10-CM | POA: Diagnosis not present

## 2018-06-06 ENCOUNTER — Encounter (HOSPITAL_COMMUNITY): Payer: Self-pay

## 2018-06-06 NOTE — Progress Notes (Signed)
Cardiac Individual Treatment Plan  Patient Details  Name: Savannah Bell MRN: [id number] Date of Birth: 1950-08-19 Referring Provider:   Flowsheet Row CARDIAC REHAB PHASE II ORIENTATION from 04/09/2018 in [place]  Referring Provider  Glori Bickers, MD      Initial Encounter Date:  [place] PHASE II ORIENTATION from 04/09/2018 in [place]  Date  04/09/18      Visit Diagnosis: 6/60/63 NSTEMI  Systolic congestive heart failure, unspecified HF chronicity ([place])  Patient's Home Medications on Admission:  Current Outpatient Medications:  .  amiodarone (PACERONE) 200 MG tablet, TAKE 1 TABLET BY MOUTH EVERY DAY, Disp: 90 tablet, Rfl: 1 .  atorvastatin (LIPITOR) 40 MG tablet, TAKE 1 TABLET BY MOUTH EVERY DAY AT 6PM, Disp: 90 tablet, Rfl: 3 .  benzonatate (TESSALON) 200 MG capsule, Take 1 capsule (200 mg total) by mouth 3 (three) times daily as needed for cough., Disp: 45 capsule, Rfl: 1 .  CALCIUM-VITAMIN D PO, Take by mouth., Disp: , Rfl:  .  carvedilol (COREG) 3.125 MG tablet, Take 3.125 mg by mouth 2 (two) times daily with a meal., Disp: , Rfl:  .  famotidine (PEPCID) 20 MG tablet, One at bedtime, Disp: 30 tablet, Rfl: 11 .  furosemide (LASIX) 20 MG tablet, Take 1 tablet (20 mg total) by mouth daily., Disp: 30 tablet, Rfl: 6 .  losartan (COZAAR) 25 MG tablet, Take 1 tablet (25 mg total) by mouth at bedtime., Disp: 30 tablet, Rfl: 6 .  pantoprazole (PROTONIX) 40 MG tablet, Take 1 tablet (40 mg total) by mouth daily. Take 30-60 min before first meal of the day, Disp: 30 tablet, Rfl: 2 .  spironolactone (ALDACTONE) 25 MG tablet, Take 1 tablet (25 mg total) by mouth daily., Disp: 30 tablet, Rfl: 6  Past Medical History: Past Medical History:  Diagnosis Date  . History of blood transfusion    "when I had colon resection" (12/11/2017)  . Migraine    "stopped in the 1970s" (12/11/2017)  . Mitral  valve prolapse     Tobacco Use: Social History   Tobacco Use  Smoking Status Former Smoker  . Packs/day: 0.50  . Years: 10.00  . Pack years: 5.00  . Types: Cigarettes  . Last attempt to quit: 1983  . Years since quitting: 36.9  Smokeless Tobacco Former Systems developer  . Types: Snuff, Chew  . Quit date: 2000    Labs: Recent Review Flowsheet Data    Labs for ITP Cardiac and Pulmonary Rehab Latest Ref Rng & Units 09/02/2011 09/07/2011 09/11/2011 12/11/2017 12/12/2017   Cholestrol 0 - 200 mg/dL - 102 117 - 190   LDLCALC 0 - 99 mg/dL - - - - 100(H)   HDL >40 mg/dL - - - - 81   Trlycerides <150 mg/dL - 65 112 - 45   Hemoglobin A1c 4.8 - 5.6 % - - - 5.7(H) -   TCO2 0 - 100 mmol/L 23 - - - -      Capillary Blood Glucose: Lab Results  Component Value Date   GLUCAP 96 09/15/2011   GLUCAP 109 (H) 09/15/2011   GLUCAP 117 (H) 09/14/2011   GLUCAP 110 (H) 09/14/2011   GLUCAP 112 (H) 09/14/2011     Exercise Target Goals: Exercise Program Goal: Individual exercise prescription set using results from initial 6 min walk test and THRR while considering  patient's activity barriers and safety.   Exercise Prescription Goal: Initial exercise prescription builds  to 30-45 minutes a day of aerobic activity, 2-3 days per week.  Home exercise guidelines will be given to patient during program as part of exercise prescription that the participant will acknowledge.  Activity Barriers & Risk Stratification: Activity Barriers & Cardiac Risk Stratification - 04/09/18 1445    Activity Barriers & Cardiac Risk Stratification          Activity Barriers  None    Cardiac Risk Stratification  High           6 Minute Walk: 6 Minute Walk    6 Minute Walk    Row Name 04/09/18 1419   Phase  Initial   Distance  1480 feet   Walk Time  6 minutes   # of Rest Breaks  0   MPH  2.8   METS  2.92   RPE  9   Perceived Dyspnea   0   VO2 Peak  10.21   Symptoms  No   Resting HR  62 bpm   Resting BP  94/60    Resting Oxygen Saturation   100 %   Exercise Oxygen Saturation  during 6 min walk  97 %   Max Ex. HR  93 bpm   Max Ex. BP  102/78   2 Minute Post BP  102/74          Oxygen Initial Assessment:   Oxygen Re-Evaluation:   Oxygen Discharge (Final Oxygen Re-Evaluation):   Initial Exercise Prescription: Initial Exercise Prescription - 04/09/18 1400    Date of Initial Exercise RX and Referring Provider          Date  04/09/18    Referring Provider  Glori Bickers, MD    Expected Discharge Date  07/15/18        Treadmill          MPH  2.5    Grade  0    Minutes  10    METs  2.91        Bike          Level  0.7    Minutes  10    METs  3.03        NuStep          Level  2    SPM  85    Minutes  10    METs  2.5        Prescription Details          Frequency (times per week)  3    Duration  Progress to 30 minutes of continuous aerobic without signs/symptoms of physical distress        Intensity          THRR 40-80% of Max Heartrate  62-123    Ratings of Perceived Exertion  11-13    Perceived Dyspnea  0-4        Progression          Progression  Continue to progress workloads to maintain intensity without signs/symptoms of physical distress.        Resistance Training          Training Prescription  Yes    Weight  2lbs    Reps  10-15           Perform Capillary Blood Glucose checks as needed.  Exercise Prescription Changes: Exercise Prescription Changes    Response to Exercise    Row Name 04/15/18 1330 04/29/18 1122 05/15/18 1315 06/04/18 1554   Blood Pressure (  Admit)  92/64  110/58  104/62  108/78   Blood Pressure (Exercise)  108/70  110/62  130/80  120/78   Blood Pressure (Exit)  102/72  98/62  98/70  102/62   Heart Rate (Admit)  78 bpm  90 bpm  73 bpm  71 bpm   Heart Rate (Exercise)  105 bpm  118 bpm  108 bpm  101 bpm   Heart Rate (Exit)  78 bpm  75 bpm  67 bpm  71 bpm   Rating of Perceived Exertion (Exercise)  _0 Symptoms  none  none  none  none   Duration  Progress to 30 minutes of  aerobic without signs/symptoms of physical distress  Progress to 30 minutes of  aerobic without signs/symptoms of physical distress  Progress to 30 minutes of  aerobic without signs/symptoms of physical distress  Progress to 30 minutes of  aerobic without signs/symptoms of physical distress   Intensity  THRR unchanged  THRR unchanged  THRR unchanged  THRR unchanged       Progression    Row Name 04/15/18 1330 04/29/18 1122 05/15/18 1315 06/04/18 1554   Progression  Continue to progress workloads to maintain intensity without signs/symptoms of physical distress.  Continue to progress workloads to maintain intensity without signs/symptoms of physical distress.  Continue to progress workloads to maintain intensity without signs/symptoms of physical distress.  Continue to progress workloads to maintain intensity without signs/symptoms of physical distress.   Average METs  2.5  3.4  3.52  3.51       Resistance Training    Row Name 04/15/18 1330 04/29/18 1122 05/15/18 1315 06/04/18 1554   Training Prescription  Yes  Yes  No  Yes   Weight  2lbs  2lbs  no documentation  2 lbs.    Reps  10-15  10-15  no documentation  10-15   Time  10 Minutes  10 Minutes  no documentation  10 Minutes       Interval Training    Row Name 04/15/18 1330 04/29/18 1122 05/15/18 1315 06/04/18 1554   Interval Training  No  No  No  No       Treadmill    Row Name 04/15/18 1330 04/29/18 1122 05/15/18 1315 06/04/18 1554   MPH  no documentation  no documentation  no documentation  no documentation   Grade  no documentation  no documentation  no documentation  no documentation   Minutes  no documentation  no documentation  no documentation  no documentation   METs  no documentation  no documentation  no documentation  no documentation       [place] Name 04/15/18 1330 04/29/18 1122 05/15/18 1315 06/04/18 1554   Level  0._1 Minutes  _2 METs  3.03  3.93  3.86  3.86       NuStep    Row Name 04/15/18 1330 04/29/18 1122 05/15/18 1315 06/04/18 1554   Level  _3 SPM  85  85  85  85   Minutes  _4 METs  1.9  2.9  3.1  2.7       Track    Row Name 04/15/18 1330 04/29/18 1122 05/15/18 1315 06/04/18 1554   Laps  9  _0 Minutes  _1 METs  2.57  3.44  3.6  3.96          Exercise Comments: Exercise Comments    Row Name 04/15/18 1423 04/24/18 1410 05/01/18 1612 05/16/18 0801 06/05/18 1454   Exercise Comments  Patient tolerated first session of exercise well without c/o.  Reviewed home exericse guidelins, METs, and goals with patient.  Reviewed METs and goals with patient.   Pt MET level has increased. Will continue to exercise at home.   Reveiwed METs and goals with Pt. Will continue exercising at a local gym.       Exercise Goals and Review: Exercise Goals    Exercise Goals    Row Name 04/09/18 1402   Increase Physical Activity  Yes   Intervention  Provide advice, education, support and counseling about physical activity/exercise needs.;Develop an individualized exercise prescription for aerobic and resistive training based on initial evaluation findings, risk stratification, comorbidities and participant's personal goals.   Expected Outcomes  Short Term: Attend rehab on a regular basis to increase amount of physical activity.;Long Term: Exercising regularly at least 3-5 days a week.;Long Term: Add in home exercise to make exercise part of routine and to increase amount of physical activity.   Increase Strength and Stamina  Yes   Intervention  Provide advice, education, support and counseling about physical activity/exercise needs.;Develop an individualized exercise prescription for aerobic and resistive training based on initial evaluation findings, risk stratification, comorbidities and participant's personal goals.   Expected Outcomes  Short Term: Increase workloads  from initial exercise prescription for resistance, speed, and METs.;Short Term: Perform resistance training exercises routinely during rehab and add in resistance training at home;Long Term: Improve cardiorespiratory fitness, muscular endurance and strength as measured by increased METs and functional capacity (6MWT)   Able to understand and use rate of perceived exertion (RPE) scale  Yes   Intervention  Provide education and explanation on how to use RPE scale   Expected Outcomes  Short Term: Able to use RPE daily in rehab to express subjective intensity level;Long Term:  Able to use RPE to guide intensity level when exercising independently   Knowledge and understanding of Target Heart Rate Range (THRR)  Yes   Intervention  Provide education and explanation of THRR including how the numbers were predicted and where they are located for reference   Expected Outcomes  Short Term: Able to state/look up THRR;Long Term: Able to use THRR to govern intensity when exercising independently;Short Term: Able to use daily as guideline for intensity in rehab   Able to check pulse independently  Yes   Intervention  Provide education and demonstration on how to check pulse in carotid and radial arteries.;Review the importance of being able to check your own pulse for safety during independent exercise   Expected Outcomes  Short Term: Able to explain why pulse checking is important during independent exercise;Long Term: Able to check pulse independently and accurately   Understanding of Exercise Prescription  Yes   Intervention  Provide education, explanation, and written materials on patient's individual exercise prescription   Expected Outcomes  Short Term: Able to explain program exercise prescription;Long Term: Able to explain home exercise prescription to exercise independently          Exercise Goals Re-Evaluation : Exercise Goals Re-Evaluation    Exercise Goal Re-Evaluation    Row Name 04/15/18 1423  04/24/18 1410 05/01/18 1610 05/16/18  0800 06/05/18 1453   Exercise Goals Review  Able to understand and use rate of perceived exertion (RPE) scale;Increase Physical Activity  Able to understand and use rate of perceived exertion (RPE) scale;Increase Physical Activity;Understanding of Exercise Prescription;Knowledge and understanding of Target Heart Rate Range (THRR)  Increase Physical Activity;Increase Strength and Stamina;Able to check pulse independently;Understanding of Exercise Prescription;Knowledge and understanding of Target Heart Rate Range (THRR);Able to understand and use rate of perceived exertion (RPE) scale  Increase Physical Activity;Increase Strength and Stamina;Able to check pulse independently;Understanding of Exercise Prescription;Knowledge and understanding of Target Heart Rate Range (THRR);Able to understand and use rate of perceived exertion (RPE) scale  Increase Physical Activity;Increase Strength and Stamina;Able to check pulse independently;Understanding of Exercise Prescription;Knowledge and understanding of Target Heart Rate Range (THRR);Able to understand and use rate of perceived exertion (RPE) scale   Comments  Patient able to understand and use RPE scale appropriately.  Reviewed home exercise guidelines including THRR, RPE scale, and endpoints for exercise. Discussed attending pulse counting class with patient. Patient is walking 1-2 miles daily as her mode of home exercise.  Patient will continue to walking 1 to 2 miles in addition to CR exercsise.   Pt MET level has increased to 3.52. Pt is toleratring exercise prescription well and exercising at home in addition to Cardiac Rehab.   Reviewed METs and goals with Pt. Pt has a MET level of 3.51. Will continue exercising at a local gym by taking exercise classes.    Expected Outcomes  Increase workloads as tolerated to help improve cardiorespiratory fitness.  Patient will continue daily walking in addition to exercise at cardiac rehab  to help achieve personal health and fitness goals.  Will continue to monitor and progress as tolerated.   Will continue to monitor and progress Pt as tolerated.   Will continue to monitor and progress Pt as tolerated.           Discharge Exercise Prescription (Final Exercise Prescription Changes): Exercise Prescription Changes - 06/04/18 1554    Response to Exercise          Blood Pressure (Admit)  108/78    Blood Pressure (Exercise)  120/78    Blood Pressure (Exit)  102/62    Heart Rate (Admit)  71 bpm    Heart Rate (Exercise)  101 bpm    Heart Rate (Exit)  71 bpm    Rating of Perceived Exertion (Exercise)  13    Symptoms  none    Duration  Progress to 30 minutes of  aerobic without signs/symptoms of physical distress    Intensity  THRR unchanged        Progression          Progression  Continue to progress workloads to maintain intensity without signs/symptoms of physical distress.    Average METs  3.51        Resistance Training          Training Prescription  Yes    Weight  2 lbs.     Reps  10-15    Time  10 Minutes        Interval Training          Interval Training  No        Bike          Level  1    Minutes  10    METs  3.86        NuStep          Level  3    SPM  85    Minutes  10    METs  2.7        Track          Laps  15    Minutes  10    METs  3.96           Nutrition:  Target Goals: Understanding of nutrition guidelines, daily intake of sodium <1522m, cholesterol <2029m calories 30% from fat and 7% or less from saturated fats, daily to have 5 or more servings of fruits and vegetables.  Biometrics: Pre Biometrics - 04/09/18 1350    Pre Biometrics          Height  4' 10.75" (1.492 m)    Weight  65 kg    Waist Circumference  32 inches    Hip Circumference  41.75 inches    Waist to Hip Ratio  0.77 %    BMI (Calculated)  29.2    Triceps Skinfold  31 mm    % Body Fat  40 %    Grip Strength  29.5 kg    Flexibility  18 in     Single Leg Stand  30 seconds            Nutrition Therapy Plan and Nutrition Goals:   Nutrition Assessments:   Nutrition Goals Re-Evaluation:   Nutrition Goals Re-Evaluation:   Nutrition Goals Discharge (Final Nutrition Goals Re-Evaluation):   Psychosocial: Target Goals: Acknowledge presence or absence of significant depression and/or stress, maximize coping skills, provide positive support system. Participant is able to verbalize types and ability to use techniques and skills needed for reducing stress and depression.  Initial Review & Psychosocial Screening: Initial Psych Review & Screening - 04/09/18 1438    Initial Review          Current issues with  Current Stress Concerns;Current Anxiety/Panic    Source of Stress Concerns  Chronic Illness;Unable to perform yard/household activities;Unable to participate in former interests or hobbies        [facility] No        Barriers          Psychosocial barriers to participate in program  The patient should benefit from training in stress management and relaxation.        Screening Interventions          Interventions  Encouraged to exercise           Quality of Life Scores: Quality of Life - 04/09/18 1453    Quality of Life          Select  Quality of Life        Quality of Life Scores          Health/Function Pre  30 %    Socioeconomic Pre  30 %    Psych/Spiritual Pre  30 %    Family Pre  30 %    GLOBAL Pre  30 %          Scores of 19 and below usually indicate a poorer quality of life in these areas.  A difference of  2-3 points is a clinically meaningful difference.  A difference of 2-3 points in the total score of the Quality of Life Index has been associated with significant improvement in overall quality of life, self-image, physical symptoms, and general health in studies assessing change in quality of life.  PHQ-9: Recent Review Flowsheet Data    There is  no flowsheet data to display.     Interpretation of Total Score  Total Score Depression Severity:  1-4 = Minimal depression, 5-9 = Mild depression, 10-14 = Moderate depression, 15-19 = Moderately severe depression, 20-27 = Severe depression   Psychosocial Evaluation and Intervention: Psychosocial Evaluation - 04/18/18 0744    Psychosocial Evaluation & Interventions          Interventions  Encouraged to exercise with the program and follow exercise prescription;Stress management education;Relaxation education    Comments  pt with health related stress and anxiety.      Expected Outcomes  pt will exhibit improved outlook and coping skills.     Continue Psychosocial Services   Follow up required by staff           Psychosocial Re-Evaluation: Psychosocial Re-Evaluation    Psychosocial Re-Evaluation    [place] Name 05/16/18 1152 06/06/18 1219   Current issues with  Current Stress Concerns;Current Anxiety/Panic  Current Stress Concerns;Current Anxiety/Panic   Comments  pt with health related stress and anxiety verbalizes improved confidence in her CR participation.    pt with health related stress and anxiety verbalizes improved confidence in her CR participation.     Expected Outcomes  pt will exhibit positive outlook with good coping skills.   pt will exhibit positive outlook with good coping skills.    Interventions  Encouraged to attend Cardiac Rehabilitation for the exercise;Stress management education;Relaxation education  Encouraged to attend Cardiac Rehabilitation for the exercise;Stress management education;Relaxation education       Initial Review    Row Name 05/16/18 1152 06/06/18 1219   Source of Stress Concerns  Chronic Illness;Unable to perform yard/household activities;Unable to participate in former interests or hobbies  no documentation          Psychosocial Discharge (Final Psychosocial Re-Evaluation): Psychosocial Re-Evaluation - 06/06/18 1219    Psychosocial  Re-Evaluation          Current issues with  Current Stress Concerns;Current Anxiety/Panic    Comments  pt with health related stress and anxiety verbalizes improved confidence in her CR participation.      Expected Outcomes  pt will exhibit positive outlook with good coping skills.     Interventions  Encouraged to attend Cardiac Rehabilitation for the exercise;Stress management education;Relaxation education           Vocational Rehabilitation: Provide vocational rehab assistance to qualifying candidates.   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 Participant will state understanding/return demonstration of topics presented.  Learning Barriers/Preferences:   Education Topics: Count Your Pulse:  -Group instruction provided by verbal instruction, demonstration, patient participation and written materials to support subject.  Instructors address importance of being able to find your pulse and how to count your pulse when at home without a heart monitor.  Patients get hands on experience counting their pulse with staff help and individually. Flowsheet Row CARDIAC REHAB PHASE II EXERCISE from 06/05/2018 in [place]  Date  05/10/18  Instruction Review Code  2- Demonstrated Understanding      Heart Attack,  Angina, and Risk Factor Modification:  -Group instruction provided by verbal instruction, video, and written materials to support subject.  Instructors address signs and symptoms of angina and heart attacks.    Also discuss risk factors for heart disease and how to make changes to improve heart health risk factors. Flowsheet Row CARDIAC REHAB  PHASE II EXERCISE from 06/05/2018 in [place]  Date  04/17/18  Instruction Review Code  2- Demonstrated Understanding      Functional Fitness:  -Group instruction provided by verbal instruction, demonstration, patient participation, and written materials to support subject.  Instructors address safety measures for doing things around the house.  Discuss how to get up and down off the floor, how to pick things up properly, how to safely get out of a chair without assistance, and balance training. Flowsheet Row CARDIAC REHAB PHASE II EXERCISE from 06/05/2018 in [place]  Date  05/31/18  Educator  EP  Instruction Review Code  2- Demonstrated Understanding      Meditation and Mindfulness:  -Group instruction provided by verbal instruction, patient participation, and written materials to support subject.  Instructor addresses importance of mindfulness and meditation practice to help reduce stress and improve awareness.  Instructor also leads participants through a meditation exercise.  Flowsheet Row CARDIAC REHAB PHASE II EXERCISE from 06/05/2018 in [place]  Date  05/08/18  Educator  RN  Instruction Review Code  2- Demonstrated Understanding      Stretching for Flexibility and Mobility:  -Group instruction provided by verbal instruction, patient participation, and written materials to support subject.  Instructors lead participants through series of stretches that are designed to increase flexibility thus improving mobility.  These stretches are additional exercise for major muscle groups that are typically performed during regular warm up and cool down.   Hands Only CPR:  -Group verbal, video, and participation provides a basic overview of AHA guidelines for community CPR. Role-play of emergencies allow participants the opportunity to practice calling for help and chest compression  technique with discussion of AED use.   Hypertension: -Group verbal and written instruction that provides a basic overview of hypertension including the most recent diagnostic guidelines, risk factor reduction with self-care instructions and medication management. Flowsheet Row CARDIAC REHAB PHASE II EXERCISE from 06/05/2018 in [place]  Date  06/05/18  Instruction Review Code  2- Demonstrated Understanding       Nutrition I class: Heart Healthy Eating:  -Group instruction provided by PowerPoint slides, verbal discussion, and written materials to support subject matter. The instructor gives an explanation and review of the Therapeutic Lifestyle Changes diet recommendations, which includes a discussion on lipid goals, dietary fat, sodium, fiber, plant stanol/sterol esters, sugar, and the components of a well-balanced, healthy diet.   Nutrition II class: Lifestyle Skills:  -Group instruction provided by PowerPoint slides, verbal discussion, and written materials to support subject matter. The instructor gives an explanation and review of label reading, grocery shopping for heart health, heart healthy recipe modifications, and ways to make healthier choices when eating out.   Diabetes Question & Answer:  -Group instruction provided by PowerPoint slides, verbal discussion, and written materials to support subject matter. The instructor gives an explanation and review of diabetes co-morbidities, pre- and post-prandial blood glucose goals, pre-exercise blood glucose goals, signs, symptoms, and treatment of hypoglycemia and hyperglycemia, and foot care basics.   Diabetes Blitz:  -Group instruction provided by  PowerPoint slides, verbal discussion, and written materials to support subject matter. The instructor gives an explanation and review of the physiology behind type 1 and type 2 diabetes, diabetes medications and rational behind using different medications,  pre- and post-prandial blood glucose recommendations and Hemoglobin A1c goals, diabetes diet, and exercise including blood glucose guidelines for exercising safely.    Portion Distortion:  -Group instruction provided by PowerPoint slides, verbal discussion, written materials, and food models to support subject matter. The instructor gives an explanation of serving size versus portion size, changes in portions sizes over the last 20 years, and what consists of a serving from each food group.   Stress Management:  -Group instruction provided by verbal instruction, video, and written materials to support subject matter.  Instructors review role of stress in heart disease and how to cope with stress positively.   Flowsheet Row CARDIAC REHAB PHASE II EXERCISE from 06/05/2018 in [place]  Date  05/22/18  Instruction Review Code  2- Demonstrated Understanding      Exercising on Your Own:  -Group instruction provided by verbal instruction, power point, and written materials to support subject.  Instructors discuss benefits of exercise, components of exercise, frequency and intensity of exercise, and end points for exercise.  Also discuss use of nitroglycerin and activating EMS.  Review options of places to exercise outside of rehab.  Review guidelines for sex with heart disease. Flowsheet Row CARDIAC REHAB PHASE II EXERCISE from 06/05/2018 in [place]  Date  05/01/18  Instruction Review Code  2- Demonstrated Understanding      Cardiac Drugs I:  -Group instruction provided by verbal instruction and written materials to support subject.  Instructor reviews cardiac drug classes: antiplatelets, anticoagulants, beta blockers, and statins.  Instructor discusses reasons, side effects, and lifestyle considerations for each drug class. Flowsheet Row CARDIAC REHAB PHASE II EXERCISE from 06/05/2018 in [place]   Date  05/29/18  Educator  Pharmacist  Instruction Review Code  2- Demonstrated Understanding      Cardiac Drugs II:  -Group instruction provided by verbal instruction and written materials to support subject.  Instructor reviews cardiac drug classes: angiotensin converting enzyme inhibitors (ACE-I), angiotensin II receptor blockers (ARBs), nitrates, and calcium channel blockers.  Instructor discusses reasons, side effects, and lifestyle considerations for each drug class. Flowsheet Row CARDIAC REHAB PHASE II EXERCISE from 06/05/2018 in [place]  Date  04/24/18  Educator  Pharmacist  Instruction Review Code  2- Demonstrated Understanding      Anatomy and Physiology of the Circulatory System:  Group verbal and written instruction and models provide basic cardiac anatomy and physiology, with the coronary electrical and arterial systems. Review of: AMI, Angina, Valve disease, Heart Failure, Peripheral Artery Disease, Cardiac Arrhythmia, Pacemakers, and the ICD.   Other Education:  -Group or individual verbal, written, or video instructions that support the educational goals of the cardiac rehab program.   Holiday Eating Survival Tips:  -Group instruction provided by PowerPoint slides, verbal discussion, and written materials to support subject matter. The instructor gives patients tips, tricks, and techniques to help them not only survive but enjoy the holidays despite the onslaught of food that accompanies the holidays.   Knowledge Questionnaire Score: Knowledge Questionnaire Score - 04/09/18 1440    Knowledge Questionnaire Score          Pre Score  18/24  Core Components/Risk Factors/Patient Goals at Admission: Personal Goals and Risk Factors at Admission - 04/09/18 1433    Core Components/Risk Factors/Patient Goals on Admission           Weight Management  Obesity;Yes    Intervention  Weight Management: Develop a combined nutrition  and exercise program designed to reach desired caloric intake, while maintaining appropriate intake of nutrient and fiber, sodium and fats, and appropriate energy expenditure required for the weight goal.;Weight Management: Provide education and appropriate resources to help participant work on and attain dietary goals.;Weight Management/Obesity: Establish reasonable short term and long term weight goals.;Obesity: Provide education and appropriate resources to help participant work on and attain dietary goals.    Expected Outcomes  Short Term: Continue to assess and modify interventions until short term weight is achieved;Long Term: Adherence to nutrition and physical activity/exercise program aimed toward attainment of established weight goal;Weight Loss: Understanding of general recommendations for a balanced deficit meal plan, which promotes 1-2 lb weight loss per week and includes a negative energy balance of [phone] kcal/d    Heart Failure  Yes    Intervention  Provide a combined exercise and nutrition program that is supplemented with education, support and counseling about heart failure. Directed toward relieving symptoms such as shortness of breath, decreased exercise tolerance, and extremity edema.    Expected Outcomes  Improve functional capacity of life;Short term: Daily weights obtained and reported for increase. Utilizing diuretic protocols set by physician.;Short term: Attendance in program 2-3 days a week with increased exercise capacity. Reported lower sodium intake. Reported increased fruit and vegetable intake. Reports medication compliance.;Long term: Adoption of self-care skills and reduction of barriers for early signs and symptoms recognition and intervention leading to self-care maintenance.    Lipids  Yes    Intervention  Provide education and support for participant on nutrition & aerobic/resistive exercise along with prescribed medications to achieve LDL <45m, HDL >436m     Expected Outcomes  Short Term: Participant states understanding of desired cholesterol values and is compliant with medications prescribed. Participant is following exercise prescription and nutrition guidelines.;Long Term: Cholesterol controlled with medications as prescribed, with individualized exercise RX and with personalized nutrition plan. Value goals: LDL < 7044mHDL > 40 mg.    Stress  Yes    Intervention  Offer individual and/or small group education and counseling on adjustment to heart disease, stress management and health-related lifestyle change. Teach and support self-help strategies.;Refer participants experiencing significant psychosocial distress to appropriate mental health specialists for further evaluation and treatment. When possible, include family members and significant others in education/counseling sessions.    Expected Outcomes  Short Term: Participant demonstrates changes in health-related behavior, relaxation and other stress management skills, ability to obtain effective social support, and compliance with psychotropic medications if prescribed.;Long Term: Emotional wellbeing is indicated by absence of clinically significant psychosocial distress or social isolation.           Core Components/Risk Factors/Patient Goals Review:  Goals and Risk Factor Review    Core Components/Risk Factors/Patient Goals Review    Row Name 04/18/18 0746 05/16/18 1154 06/06/18 1220   Personal Goals Review  Weight Management/Obesity;Heart Failure;Lipids;Stress  Weight Management/Obesity;Heart Failure;Lipids;Stress  Weight Management/Obesity;Heart Failure;Lipids;Stress   Review  pt with multiple CAD RF demonstrates willingness to participate in CR program. pt encouraged to participate in exercise and education opportunities.   pt with multiple CAD RF demonstrates willingness to participate in CR program. pt encouraged to participate in exercise and education opportunities. pt  reports  consistency in HEP and eagerness to maintain healthy lifestyle.   pt with multiple CAD RF demonstrates willingness to participate in CR program. pt encouraged to participate in exercise and education opportunities. pt reports consistency in HEP and eagerness to maintain healthy lifestyle.    Expected Outcomes  pt will participate in CR exercise, nutrition and lifestyle modification opportunities to decrease overall RF.   pt will participate in CR exercise, nutrition and lifestyle modification opportunities to decrease overall RF.   pt will participate in CR exercise, nutrition and lifestyle modification opportunities to decrease overall RF.           Core Components/Risk Factors/Patient Goals at Discharge (Final Review):  Goals and Risk Factor Review - 06/06/18 1220    Core Components/Risk Factors/Patient Goals Review          Personal Goals Review  Weight Management/Obesity;Heart Failure;Lipids;Stress    Review  pt with multiple CAD RF demonstrates willingness to participate in CR program. pt encouraged to participate in exercise and education opportunities. pt reports consistency in HEP and eagerness to maintain healthy lifestyle.     Expected Outcomes  pt will participate in CR exercise, nutrition and lifestyle modification opportunities to decrease overall RF.            ITP Comments: ITP Comments    Row Name 04/09/18 1353 04/18/18 0744 05/16/18 1151 06/06/18 1218   ITP Comments  Dr.Traci Radford Pax, Medical Director   30 day ITP review. pt recently started group exercise program.    30 day ITP review. pt with good attendance and participation. pt demonstrates willingness to participate in CR program.   30 day ITP review. pt with good attendance and participation. pt demonstrates willingness to participate in CR program.       Comments:

## 2018-06-07 ENCOUNTER — Encounter (HOSPITAL_COMMUNITY): Payer: 59

## 2018-06-07 ENCOUNTER — Encounter (HOSPITAL_COMMUNITY)
Admission: RE | Admit: 2018-06-07 | Discharge: 2018-06-07 | Disposition: A | Discharge status: Home / Self Care | Payer: 59 | Source: Ambulatory Visit | Attending: Internal Medicine | Admitting: Internal Medicine

## 2018-06-07 DIAGNOSIS — I5020 Unspecified systolic (congestive) heart failure: Secondary | ICD-10-CM

## 2018-06-07 DIAGNOSIS — I214 Non-ST elevation (NSTEMI) myocardial infarction: Secondary | ICD-10-CM

## 2018-06-10 ENCOUNTER — Encounter (HOSPITAL_COMMUNITY)
Admission: RE | Admit: 2018-06-10 | Discharge: 2018-06-10 | Disposition: A | Discharge status: Home / Self Care | Payer: 59 | Source: Ambulatory Visit | Attending: Internal Medicine | Admitting: Internal Medicine

## 2018-06-10 ENCOUNTER — Encounter (HOSPITAL_COMMUNITY): Payer: 59

## 2018-06-10 VITALS — Ht 58.75 in | Wt 145.3 lb

## 2018-06-10 DIAGNOSIS — Z713 Dietary counseling and surveillance: Secondary | ICD-10-CM | POA: Diagnosis not present

## 2018-06-10 DIAGNOSIS — I5020 Unspecified systolic (congestive) heart failure: Secondary | ICD-10-CM

## 2018-06-10 DIAGNOSIS — I214 Non-ST elevation (NSTEMI) myocardial infarction: Secondary | ICD-10-CM

## 2018-06-10 NOTE — Progress Notes (Signed)
I have reviewed a Home Exercise Prescription with Savannah Bell . Savannah Bell is currently exercising at home.  The patient was advised to walk 5-7 days a week for 30-45 minutes.  Savannah Bell and I discussed how to progress their exercise prescription.  The patient stated that their goals were to continue exercising at home by walking 30 minutes 5-7 days per week.  The patient stated that they understand the exercise prescription.  We reviewed exercise guidelines, target heart rate during exercise, RPE Scale, weather conditions, NTG use, endpoints for exercise, warmup and cool down.  Patient is encouraged to come to me with any questions. I will continue to follow up with the patient to assist them with progression and safety.    06/10/2018 4:07 PM  Savannah Bell BS, ACSM CEP

## 2018-06-11 ENCOUNTER — Encounter: Payer: Self-pay | Admitting: Internal Medicine

## 2018-06-11 ENCOUNTER — Ambulatory Visit: Payer: Medicare HMO | Admitting: Internal Medicine

## 2018-06-11 DIAGNOSIS — J45991 Cough variant asthma: Secondary | ICD-10-CM

## 2018-06-11 MED ORDER — PREDNISONE 10 MG PO TABS: ORAL_TABLET | ORAL | 0 refills | Status: DC

## 2018-06-11 NOTE — Patient Instructions (Addendum)
For drainage / throat tickle try take CHLORPHENIRAMINE  4 mg (chlortab =[facility])  - take one every 4 hours as needed - available over the counter- may cause drowsiness so start with just a bedtime dose or two and see how you tolerate it before trying in daytime    Prednisone 10 mg take  4 each am x 2 days,   2 each am x 2 days,  1 each am x 2 days and stop    Continue protonix 40 mg Take 30-60 min before first meal and right after the last meal take pepcid 20mg  (famotidine)   GERD (REFLUX)  is an extremely common cause of respiratory symptoms just like yours , many times with no obvious heartburn at all.    It can be treated with medication, but also with lifestyle changes including elevation of the head of your bed (ideally with 6 inch  bed blocks),  Smoking cessation, avoidance of late meals, excessive alcohol, and avoid fatty foods, chocolate, peppermint, colas, red wine, and acidic juices such as orange juice.  NO MINT OR MENTHOL PRODUCTS SO NO COUGH DROPS   USE SUGARLESS CANDY INSTEAD (Jolley ranchers or Stover's or Life Savers) or even ice chips will also do - the key is to swallow to prevent all throat clearing. NO OIL BASED VITAMINS - use powdered substitutes.     Please schedule a follow up office visit in 2  weeks, sooner if needed  with all medications /inhalers/ solutions in hand so we can verify exactly what you are taking. This includes all medications from all doctors and over the counters

## 2018-06-11 NOTE — Progress Notes (Signed)
Savannah Bell, female    DOB: October 23, 1950,   MRN: [id number]    Brief patient profile:  51 yobf  Quit smoking 1983 s resp symptoms  with  Onset sinus problems while living in same home in  [place] around 2013   with springtime = fall sniffling/ sneezing/ congestion > rx by PCP with flonase and improved but returned every season sometimes with cough better with clariton prn but then Aug 2019 no better with clariton or robitussin / prescription cough meds and  Persisted daily  so referred to pulmonary clinic 05/16/2018 by Dr Lorin Picket with h/o chf but no improvement in cough with adequate diuresis.   History of Present Illness  05/16/2018  Pulmonary/ 1st office eval  Chief Complaint  Patient presents with  . Pulmonary Consult    Referred by Dr. Lorin Picket. Pt c/o cough x 3 months- non prod and with no specific trigger.   Dyspnea:  Not limited by breathing from desired activities   Cough: indolent onset persistent cough since early aug 2019 not present sleeping  s assoc flare of  sniffles  Sleep: able lie flat  SABA use: none rec Stop clariton and start zyrtec 10 mg at bedtime  For cough >  Tessalon pearls 200 mg every 8 hours as needed  Pantoprazole (protonix) 40 mg   Take  30-60 min before first meal of the day and Pepcid (famotidine)  20 mg one @  bedtime until return to office     GERD (REFLUX)  is an extremely common cause of respiratory symptoms just like yours , many times with no obvious heartburn at all.  Please schedule a follow up office visit in 4 weeks, sooner if needed      06/11/2018  f/u ov/ re: cough x aug 2019 wake, not while asleep,  mostly dry  Chief Complaint  Patient presents with  . Follow-up    Cough is unchanged. No new co's.   Dyspnea:  Not limited by breathing from desired activities   Cough: as above  - feels tickle in throat  Sleeping: fine flat 2 pillows  SABA use: none  02: none   Still using mints   No obvious day to day or daytime variability  or assoc excess/ purulent sputum or mucus plugs or hemoptysis or cp or chest tightness, subjective wheeze or overt sinus or hb symptoms.   Sleeping as above  without nocturnal  or early am exacerbation  of respiratory  c/o's or need for noct saba. Also denies any obvious fluctuation of symptoms with weather or environmental changes or other aggravating or alleviating factors except as outlined above   No unusual exposure hx or h/o childhood pna/ asthma or knowledge of premature birth.  Current Allergies, Complete Past Medical History, Past Surgical History, Family History, and Social History were reviewed in [employer] record.  ROS  The following are not active complaints unless bolded Hoarseness, sore throat, dysphagia, dental problems, itching, sneezing,  nasal congestion or discharge of excess mucus or purulent secretions, ear ache,   fever, chills, sweats, unintended wt loss or wt gain, classically pleuritic or exertional cp,  orthopnea pnd or arm/hand swelling  or leg swelling, presyncope, palpitations, abdominal pain, anorexia, nausea, vomiting, diarrhea  or change in bowel habits or change in bladder habits, change in stools or change in urine, dysuria, hematuria,  rash, arthralgias, visual complaints, headache, numbness, weakness or ataxia or problems with walking or coordination,  change in mood or  memory.        Current Meds  Medication Sig  . amiodarone (PACERONE) 200 MG tablet TAKE 1 TABLET BY MOUTH EVERY DAY  . atorvastatin (LIPITOR) 40 MG tablet TAKE 1 TABLET BY MOUTH EVERY DAY AT 6PM  . benzonatate (TESSALON) 200 MG capsule Take 1 capsule (200 mg total) by mouth 3 (three) times daily as needed for cough.  Marland Kitchen CALCIUM-VITAMIN D PO Take by mouth.  . carvedilol (COREG) 3.125 MG tablet Take 3.125 mg by mouth 2 (two) times daily with a meal.  . famotidine (PEPCID) 20 MG tablet One at bedtime  . furosemide (LASIX) 20 MG tablet Take 1 tablet (20 mg total) by mouth  daily.  Marland Kitchen losartan (COZAAR) 25 MG tablet Take 1 tablet (25 mg total) by mouth at bedtime.  . pantoprazole (PROTONIX) 40 MG tablet Take 1 tablet (40 mg total) by mouth daily. Take 30-60 min before first meal of the day  . spironolactone (ALDACTONE) 25 MG tablet Take 1 tablet (25 mg total) by mouth daily.                        Objective:      amb bf nad  Wt Readings from Last 3 Encounters:  06/11/18 146 lb (66.2 kg)  06/10/18 145 lb 4.5 oz (65.9 kg)  05/16/18 144 lb (65.3 kg)     Vital signs reviewed - Note on arrival 02 sats  100% on RA       HEENT: nl dentition, turbinates bilaterally,   Oropharynx with mild cobblestoning.  Nl external ear canals without cough reflex   NECK :  without JVD/Nodes/TM/ nl carotid upstrokes bilaterally   LUNGS: no acc muscle use,  Nl contour chest which is clear to A and P bilaterally without cough on insp or exp maneuvers   CV:  RRR  no s3 or murmur or increase in P2, and no edema   ABD:  soft and nontender with nl inspiratory excursion in the supine position. No bruits or organomegaly appreciated, bowel sounds nl  MS:  Nl gait/ ext warm without deformities, calf tenderness, cyanosis or clubbing No obvious joint restrictions   SKIN: warm and dry without lesions    NEURO:  alert, approp, nl sensorium with  no motor or cerebellar deficits apparent.   .           Assessment

## 2018-06-12 ENCOUNTER — Encounter (HOSPITAL_COMMUNITY): Payer: 59

## 2018-06-12 ENCOUNTER — Encounter: Payer: Self-pay | Admitting: Internal Medicine

## 2018-06-12 ENCOUNTER — Encounter (HOSPITAL_COMMUNITY)
Admission: RE | Admit: 2018-06-12 | Discharge: 2018-06-12 | Disposition: A | Discharge status: Home / Self Care | Payer: 59 | Source: Ambulatory Visit | Attending: Internal Medicine | Admitting: Internal Medicine

## 2018-06-12 DIAGNOSIS — I214 Non-ST elevation (NSTEMI) myocardial infarction: Secondary | ICD-10-CM

## 2018-06-12 DIAGNOSIS — I5020 Unspecified systolic (congestive) heart failure: Secondary | ICD-10-CM

## 2018-06-12 DIAGNOSIS — Z713 Dietary counseling and surveillance: Secondary | ICD-10-CM | POA: Diagnosis not present

## 2018-06-12 NOTE — Assessment & Plan Note (Addendum)
FENO 05/16/2018  =   7 - Allergy profile 05/16/2018 >  Eos 0.0 /  IgE  8 RAST neg - Spirometry 05/16/2018  FEV1 1.9 (126%)  Ratio 82 s prior rx/ no curvature - 05/16/2018 rx for gerd  > no change though not compliant with diet  - 06/11/2018  Added 1st gen H1 blockers per guidelines  And short term prednisone   Strongly favor uacs over asthma  here based on absence of noct symptoms or variability. Upper airway cough syndrome (previously labeled PNDS),  is so named because it's frequently impossible to sort out how much is  CR/sinusitis with freq throat clearing (which can be related to primary GERD)   vs  causing  secondary (" extra esophageal")  GERD from wide swings in gastric pressure that occur with throat clearing, often  promoting self use of mint and menthol lozenges that reduce the lower esophageal sphincter tone and exacerbate the problem further in a cyclical fashion.   These are the same pts (now being labeled as having "irritable larynx syndrome" by some cough centers) who not infrequently have a history of having failed to tolerate ace inhibitors,  dry powder inhalers or biphosphonates or report having atypical/extraesophageal reflux symptoms that don't respond to standard doses of PPI  and are easily confused as having aecopd or asthma flares by even experienced allergists/ pulmonologists (myself included).   Of the three most common causes of  Sub-acute / recurrent or chronic cough, only one (GERD)  can actually contribute to/ trigger  the other two (asthma and post nasal drip syndrome)  and perpetuate the cylce of cough.  While not intuitively obvious, many patients with chronic low grade reflux do not cough until there is a primary insult that disturbs the protective epithelial barrier and exposes sensitive nerve endings.   This is typically viral but can due to PNDS and  either may apply here.      >>> The point is that once this occurs, it is difficult to eliminate the cycle  using  anything but a maximally effective acid suppression regimen at least in the short run, accompanied by an appropriate diet to address non acid GERD and  eliminate  pnds with 1st gen H1 blockers per guidelines  And try short term prednisone for any T2 driven upper inflammation that may be present.    >>> F/u in 2 weeks to regroup with all meds in hand using a trust but verify approach to confirm accurate Medication  Reconciliation The principal here is that until we are certain that the  patients are doing what we've asked, it makes no sense to ask them to do more.

## 2018-06-14 ENCOUNTER — Encounter (HOSPITAL_COMMUNITY): Payer: 59

## 2018-06-14 ENCOUNTER — Ambulatory Visit: Payer: 59

## 2018-06-14 ENCOUNTER — Ambulatory Visit: Payer: 59 | Admitting: Internal Medicine

## 2018-06-17 ENCOUNTER — Encounter (HOSPITAL_COMMUNITY)
Admission: RE | Admit: 2018-06-17 | Discharge: 2018-06-17 | Disposition: A | Discharge status: Home / Self Care | Payer: 59 | Source: Ambulatory Visit | Attending: Internal Medicine | Admitting: Internal Medicine

## 2018-06-17 ENCOUNTER — Encounter (HOSPITAL_COMMUNITY): Payer: 59

## 2018-06-17 DIAGNOSIS — Z713 Dietary counseling and surveillance: Secondary | ICD-10-CM | POA: Insufficient documentation

## 2018-06-17 DIAGNOSIS — I214 Non-ST elevation (NSTEMI) myocardial infarction: Secondary | ICD-10-CM | POA: Insufficient documentation

## 2018-06-17 DIAGNOSIS — I5020 Unspecified systolic (congestive) heart failure: Secondary | ICD-10-CM

## 2018-06-17 DIAGNOSIS — I509 Heart failure, unspecified: Secondary | ICD-10-CM | POA: Insufficient documentation

## 2018-06-19 ENCOUNTER — Encounter (HOSPITAL_COMMUNITY): Payer: 59

## 2018-06-21 ENCOUNTER — Encounter (HOSPITAL_COMMUNITY): Payer: 59

## 2018-06-24 ENCOUNTER — Encounter (HOSPITAL_COMMUNITY): Payer: 59

## 2018-06-24 ENCOUNTER — Encounter (HOSPITAL_COMMUNITY)
Admission: RE | Admit: 2018-06-24 | Discharge: 2018-06-24 | Disposition: A | Discharge status: Home / Self Care | Payer: 59 | Source: Ambulatory Visit | Attending: Internal Medicine | Admitting: Internal Medicine

## 2018-06-24 DIAGNOSIS — Z713 Dietary counseling and surveillance: Secondary | ICD-10-CM | POA: Diagnosis not present

## 2018-06-24 DIAGNOSIS — I214 Non-ST elevation (NSTEMI) myocardial infarction: Secondary | ICD-10-CM

## 2018-06-24 DIAGNOSIS — I5020 Unspecified systolic (congestive) heart failure: Secondary | ICD-10-CM

## 2018-06-25 ENCOUNTER — Encounter: Payer: Self-pay | Admitting: Internal Medicine

## 2018-06-25 ENCOUNTER — Ambulatory Visit (INDEPENDENT_AMBULATORY_CARE_PROVIDER_SITE_OTHER): Payer: Self-pay | Admitting: Internal Medicine

## 2018-06-25 DIAGNOSIS — J45991 Cough variant asthma: Secondary | ICD-10-CM

## 2018-06-25 MED ORDER — BENZONATATE 200 MG PO CAPS: 200.0000 mg | ORAL_CAPSULE | Freq: Three times a day (TID) | ORAL | 2 refills | Status: DC | PRN

## 2018-06-25 NOTE — Patient Instructions (Addendum)
For drainage / throat tickle try take CHLORPHENIRAMINE  4 mg - take one every 4 hours as needed - available over the counter- may cause drowsiness so start with just a bedtime dose or two and see how you tolerate it before trying in daytime    Any time the cough gets worse >  Take tessalon 200 mg every 6-8 hours as needed   Anytime you start coughing any reason >  Try prilosec otc 20mg   Take 30-60 min before first meal of the day and Pepcid ac (famotidine) 20 mg one @  bedtime until cough is completely gone for at least a week without the need for cough suppression    If you are satisfied with your treatment plan,  let your doctor know and he/she can either refill your medications or you can return here when your prescription runs out.     If in any way you are not 100% satisfied,  please tell us.  If 100% better, tell your friends!  Pulmonary follow up is as needed

## 2018-06-25 NOTE — Progress Notes (Signed)
Savannah Bell, female    DOB: 03-15-1951,   MRN: [id number]    Brief patient profile:  89 yobf  Quit smoking 1983 s resp symptoms  with  Onset sinus problems while living in same home in  [place] around 2013   with springtime = fall sniffling/ sneezing/ congestion > rx by PCP with flonase and improved but returned every season sometimes with cough better with clariton prn but then Aug 2019 no better with clariton or robitussin / prescription cough meds and  Persisted daily  so referred to pulmonary clinic 05/16/2018 by Dr Lorin Picket with h/o chf but no improvement in cough with adequate diuresis.   History of Present Illness  05/16/2018  Pulmonary/ 1st office eval  Chief Complaint  Patient presents with  . Pulmonary Consult    Referred by Dr. Lorin Picket. Pt c/o cough x 3 months- non prod and with no specific trigger.   Dyspnea:  Not limited by breathing from desired activities   Cough: indolent onset persistent cough since early aug 2019 not present sleeping  s assoc flare of  sniffles  Sleep: able lie flat  SABA use: none rec Stop clariton and start zyrtec 10 mg at bedtime  For cough >  Tessalon pearls 200 mg every 8 hours as needed  Pantoprazole (protonix) 40 mg   Take  30-60 min before first meal of the day and Pepcid (famotidine)  20 mg one @  bedtime until return to office     GERD (REFLUX)  is an extremely common cause of respiratory symptoms just like yours , many times with no obvious heartburn at all.  Please schedule a follow up office visit in 4 weeks, sooner if needed      06/11/2018  f/u ov/Wert re: cough x aug 2019 wake, not while asleep,  mostly dry  Chief Complaint  Patient presents with  . Follow-up    Cough is unchanged. No new co's.   Dyspnea:  Not limited by breathing from desired activities   Cough: as above  - feels tickle in throat  Sleeping: fine flat 2 pillows  SABA use: none  02: none  Still using mints rec For drainage / throat tickle try take  CHLORPHENIRAMINE  4 mg (chlortab =[facility])  - take one every 4 hours as needed - available over the counter- may cause drowsiness so start with just a bedtime dose or two and see how you tolerate it before trying in daytime   Prednisone 10 mg take  4 each am x 2 days,   2 each am x 2 days,  1 each am x 2 days and stop  Continue protonix 40 mg Take 30-60 min before first meal and right after the last meal take pepcid 20mg  (famotidine) GERD diet  Please schedule a follow up office visit in 2  weeks, sooner if needed  with all medications /inhalers/ solutions in hand so we can verify exactly what you are taking. This includes all medications from all doctors and over the counters   06/25/2018  f/u ov/Wert re: cough x aug 2019  Chief Complaint  Patient presents with  . Follow-up    Cough has improved some. No new co's.   Dyspnea:  Not limited by breathing from desired activities   Cough:  No longer needing tessalon   Sleeping: during the day works 830 pm to 530 am  /sleeps daytime ok  SABA use: none 02: none  No obvious day to day or daytime  variability or assoc excess/ purulent sputum or mucus plugs or hemoptysis or cp or chest tightness, subjective wheeze or overt sinus or hb symptoms.   Sleeping  without nocturnal  or early am exacerbation  of respiratory  c/o's or need for noct saba. Also denies any obvious fluctuation of symptoms with weather or environmental changes or other aggravating or alleviating factors except as outlined above   No unusual exposure hx or h/o childhood pna/ asthma or knowledge of premature birth.  Current Allergies, Complete Past Medical History, Past Surgical History, Family History, and Social History were reviewed in [employer] record.  ROS  The following are not active complaints unless bolded Hoarseness, sore throat, dysphagia, dental problems, itching, sneezing,  nasal congestion or discharge of excess mucus or purulent secretions,  ear ache,   fever, chills, sweats, unintended wt loss or wt gain, classically pleuritic or exertional cp,  orthopnea pnd or arm/hand swelling  or leg swelling, presyncope, palpitations, abdominal pain, anorexia, nausea, vomiting, diarrhea  or change in bowel habits or change in bladder habits, change in stools or change in urine, dysuria, hematuria,  rash, arthralgias, visual complaints, headache, numbness, weakness or ataxia or problems with walking or coordination,  change in mood or  memory.        Current Meds  Medication Sig  . amiodarone (PACERONE) 200 MG tablet TAKE 1 TABLET BY MOUTH EVERY DAY  . atorvastatin (LIPITOR) 40 MG tablet TAKE 1 TABLET BY MOUTH EVERY DAY AT 6PM  . benzonatate (TESSALON) 200 MG capsule Take 1 capsule (200 mg total) by mouth 3 (three) times daily as needed for cough.  Marland Kitchen CALCIUM-VITAMIN D PO Take by mouth.  . carvedilol (COREG) 3.125 MG tablet Take 3.125 mg by mouth 2 (two) times daily with a meal.  . chlorpheniramine (CHLOR-TRIMETON) 4 MG tablet Take 4 mg by mouth every 4 (four) hours as needed for allergies.  . famotidine (PEPCID) 20 MG tablet One at bedtime  . furosemide (LASIX) 20 MG tablet Take 1 tablet (20 mg total) by mouth daily.  Marland Kitchen losartan (COZAAR) 25 MG tablet Take 1 tablet (25 mg total) by mouth at bedtime.  . pantoprazole (PROTONIX) 40 MG tablet Take 1 tablet (40 mg total) by mouth daily. Take 30-60 min before first meal of the day  . spironolactone (ALDACTONE) 25 MG tablet Take 1 tablet (25 mg total) by mouth daily.  .  benzonatate (TESSALON) 200 MG capsule Take 1 capsule (200 mg total) by mouth 3 (three) times daily as needed for cough.                              Objective:    amb bf nad     06/25/2018     146   06/11/18 146 lb (66.2 kg)  06/10/18 145 lb 4.5 oz (65.9 kg)  05/16/18 144 lb (65.3 kg)      Vital signs reviewed - Note on arrival 02 sats  99% on RA        HEENT: nl dentition, turbinates bilaterally, and  oropharynx. Nl external ear canals without cough reflex   NECK :  without JVD/Nodes/TM/ nl carotid upstrokes bilaterally   LUNGS: no acc muscle use,  Nl contour chest which is clear to A and P bilaterally without cough on insp or exp maneuvers   CV:  RRR  no s3 or murmur or increase in P2, and no edema   ABD:  soft and nontender with nl inspiratory excursion in the supine position. No bruits or organomegaly appreciated, bowel sounds nl  MS:  Nl gait/ ext warm without deformities, calf tenderness, cyanosis or clubbing No obvious joint restrictions   SKIN: warm and dry without lesions    NEURO:  alert, approp, nl sensorium with  no motor or cerebellar deficits apparent.           Assessment

## 2018-06-26 ENCOUNTER — Encounter (HOSPITAL_COMMUNITY)
Admission: RE | Admit: 2018-06-26 | Discharge: 2018-06-26 | Disposition: A | Discharge status: Home / Self Care | Payer: 59 | Source: Ambulatory Visit | Attending: Internal Medicine | Admitting: Internal Medicine

## 2018-06-26 ENCOUNTER — Encounter: Payer: Self-pay | Admitting: Internal Medicine

## 2018-06-26 ENCOUNTER — Telehealth: Payer: Self-pay | Admitting: Internal Medicine

## 2018-06-26 ENCOUNTER — Encounter (HOSPITAL_COMMUNITY): Payer: 59

## 2018-06-26 DIAGNOSIS — I5020 Unspecified systolic (congestive) heart failure: Secondary | ICD-10-CM

## 2018-06-26 DIAGNOSIS — Z713 Dietary counseling and surveillance: Secondary | ICD-10-CM | POA: Diagnosis not present

## 2018-06-26 DIAGNOSIS — I214 Non-ST elevation (NSTEMI) myocardial infarction: Secondary | ICD-10-CM

## 2018-06-26 NOTE — Telephone Encounter (Signed)
error 

## 2018-06-26 NOTE — Assessment & Plan Note (Signed)
FENO 05/16/2018  =   7 - Allergy profile 05/16/2018 >  Eos 0.0 /  IgE  8 RAST neg - Spirometry 05/16/2018  FEV1 1.9 (126%)  Ratio 82 s prior rx/ no curvature - 05/16/2018 rx for gerd  > no change though not compliant with diet  - 06/11/2018  Added 1st gen H1 blockers per guidelines  And short term prednisone   - cough resolved at f/u ov 06/25/2018 > wean off gerd rx and if flares needs gi eval   Clearly improved and can probably just treat this cough with prn 's otc ie prilosec, pepcid and chlorpheniramine if recurs and pulmonary f/u can be prn   I had an extended summary discussion with the patient reviewing all relevant studies completed to date and  lasting 10  minutes of a 15 minute office visit    Each maintenance medication was reviewed in detail including most importantly the difference between maintenance and prns and under what circumstances the prns are to be triggered using an action plan format that is not reflected in the computer generated alphabetically organized AVS.     Please see AVS for specific instructions unique to this visit that I personally wrote and verbalized to the the pt in detail and then reviewed with pt  by my nurse highlighting any  changes in therapy recommended at today's visit to their plan of care.

## 2018-06-26 NOTE — Progress Notes (Signed)
Cardiac Individual Treatment Plan  Patient Details  Name: Savannah Bell MRN: [id number] Date of Birth: [birth year] Referring Provider:   Flowsheet Row CARDIAC REHAB PHASE II ORIENTATION from 04/09/2018 in [facility]  Referring Provider  Glori Bickers, MD      Initial Encounter Date:  [place] PHASE II ORIENTATION from 04/09/2018 in [facility]  Date  04/09/18      Visit Diagnosis: Systolic congestive heart failure, unspecified HF chronicity ([place])  12/11/17 NSTEMI  Patient's Home Medications on Admission:  Current Outpatient Medications:  .  amiodarone (PACERONE) 200 MG tablet, TAKE 1 TABLET BY MOUTH EVERY DAY, Disp: 90 tablet, Rfl: 1 .  atorvastatin (LIPITOR) 40 MG tablet, TAKE 1 TABLET BY MOUTH EVERY DAY AT 6PM, Disp: 90 tablet, Rfl: 3 .  benzonatate (TESSALON) 200 MG capsule, Take 1 capsule (200 mg total) by mouth 3 (three) times daily as needed for cough., Disp: 45 capsule, Rfl: 2 .  CALCIUM-VITAMIN D PO, Take by mouth., Disp: , Rfl:  .  carvedilol (COREG) 3.125 MG tablet, Take 3.125 mg by mouth 2 (two) times daily with a meal., Disp: , Rfl:  .  chlorpheniramine (CHLOR-TRIMETON) 4 MG tablet, Take 4 mg by mouth every 4 (four) hours as needed for allergies., Disp: , Rfl:  .  famotidine (PEPCID) 20 MG tablet, One at bedtime, Disp: 30 tablet, Rfl: 11 .  furosemide (LASIX) 20 MG tablet, Take 1 tablet (20 mg total) by mouth daily., Disp: 30 tablet, Rfl: 6 .  losartan (COZAAR) 25 MG tablet, Take 1 tablet (25 mg total) by mouth at bedtime., Disp: 30 tablet, Rfl: 6 .  pantoprazole (PROTONIX) 40 MG tablet, Take 1 tablet (40 mg total) by mouth daily. Take 30-60 min before first meal of the day, Disp: 30 tablet, Rfl: 2 .  spironolactone (ALDACTONE) 25 MG tablet, Take 1 tablet (25 mg total) by mouth daily., Disp: 30 tablet, Rfl: 6  Past Medical History: Past Medical History:  Diagnosis Date  . History of  blood transfusion    "when I had colon resection" (12/11/2017)  . Migraine    "stopped in the 1970s" (12/11/2017)  . Mitral valve prolapse     Tobacco Use: Social History   Tobacco Use  Smoking Status Former Smoker  . Packs/day: 0.50  . Years: 10.00  . Pack years: 5.00  . Types: Cigarettes  . Last attempt to quit: 1983  . Years since quitting: 36.9  Smokeless Tobacco Former Systems developer  . Types: Snuff, Chew  . Quit date: 2000    Labs: Recent Review Flowsheet Data    Labs for ITP Cardiac and Pulmonary Rehab Latest Ref Rng & Units 09/02/2011 09/07/2011 09/11/2011 12/11/2017 12/12/2017   Cholestrol 0 - 200 mg/dL - 102 117 - 190   LDLCALC 0 - 99 mg/dL - - - - 100(H)   HDL >40 mg/dL - - - - 81   Trlycerides <150 mg/dL - 65 112 - 45   Hemoglobin A1c 4.8 - 5.6 % - - - 5.7(H) -   TCO2 0 - 100 mmol/L 23 - - - -      Capillary Blood Glucose: Lab Results  Component Value Date   GLUCAP 96 09/15/2011   GLUCAP 109 (H) 09/15/2011   GLUCAP 117 (H) 09/14/2011   GLUCAP 110 (H) 09/14/2011   GLUCAP 112 (H) 09/14/2011     Exercise Target Goals: Exercise Program Goal: Individual exercise prescription set using results from  initial 6 min walk test and THRR while considering  patient's activity barriers and safety.   Exercise Prescription Goal: Initial exercise prescription builds to 30-45 minutes a day of aerobic activity, 2-3 days per week.  Home exercise guidelines will be given to patient during program as part of exercise prescription that the participant will acknowledge.  Activity Barriers & Risk Stratification: Activity Barriers & Cardiac Risk Stratification - 04/09/18 1445    Activity Barriers & Cardiac Risk Stratification          Activity Barriers  None    Cardiac Risk Stratification  High           6 Minute Walk: 6 Minute Walk    6 Minute Walk    Row Name 04/09/18 1419 06/10/18 1448   Phase  Initial  Discharge   Distance  1480 feet  1727 feet   Distance % Change  no  documentation  16.69 %   Distance Feet Change  no documentation  247 ft   Walk Time  6 minutes  6 minutes   # of Rest Breaks  0  0   MPH  2.8  3.27   METS  2.92  3.48   RPE  9  11   Perceived Dyspnea   0  no documentation   VO2 Peak  10.21  12.17   Symptoms  No  No   Resting HR  62 bpm  88 bpm   Resting BP  94/60  102/60   Resting Oxygen Saturation   100 %  no documentation   Exercise Oxygen Saturation  during 6 min walk  97 %  no documentation   Max Ex. HR  93 bpm  94 bpm   Max Ex. BP  102/78  122/70   2 Minute Post BP  102/74  120/70          Oxygen Initial Assessment:   Oxygen Re-Evaluation:   Oxygen Discharge (Final Oxygen Re-Evaluation):   Initial Exercise Prescription: Initial Exercise Prescription - 04/09/18 1400    Date of Initial Exercise RX and Referring Provider          Date  04/09/18    Referring Provider  Glori Bickers, MD    Expected Discharge Date  07/15/18        Treadmill          MPH  2.5    Grade  0    Minutes  10    METs  2.91        Bike          Level  0.7    Minutes  10    METs  3.03        NuStep          Level  2    SPM  85    Minutes  10    METs  2.5        Prescription Details          Frequency (times per week)  3    Duration  Progress to 30 minutes of continuous aerobic without signs/symptoms of physical distress        Intensity          THRR 40-80% of Max Heartrate  62-123    Ratings of Perceived Exertion  11-13    Perceived Dyspnea  0-4        Progression          Progression  Continue to progress workloads  to maintain intensity without signs/symptoms of physical distress.        Resistance Training          Training Prescription  Yes    Weight  2lbs    Reps  10-15           Perform Capillary Blood Glucose checks as needed.  Exercise Prescription Changes: Exercise Prescription Changes    Response to Exercise    Row Name 04/15/18 1330 04/29/18 1122 05/15/18 1315 06/04/18 1554 06/10/18  1607   Blood Pressure (Admit)  92/64  110/58  104/62  108/78  108/78   Blood Pressure (Exercise)  108/70  110/62  130/80  120/78  120/78   Blood Pressure (Exit)  102/72  98/62  98/70  102/62  102/62   Heart Rate (Admit)  78 bpm  90 bpm  73 bpm  71 bpm  71 bpm   Heart Rate (Exercise)  105 bpm  118 bpm  108 bpm  101 bpm  101 bpm   Heart Rate (Exit)  78 bpm  75 bpm  67 bpm  71 bpm  71 bpm   Rating of Perceived Exertion (Exercise)  '11  12  13  13  13   '$ Symptoms  none  none  none  none  none   Duration  Progress to 30 minutes of  aerobic without signs/symptoms of physical distress  Progress to 30 minutes of  aerobic without signs/symptoms of physical distress  Progress to 30 minutes of  aerobic without signs/symptoms of physical distress  Progress to 30 minutes of  aerobic without signs/symptoms of physical distress  Progress to 30 minutes of  aerobic without signs/symptoms of physical distress   Intensity  THRR unchanged  THRR unchanged  THRR unchanged  THRR unchanged  THRR unchanged       Progression    Row Name 04/15/18 1330 04/29/18 1122 05/15/18 1315 06/04/18 1554 06/10/18 1607   Progression  Continue to progress workloads to maintain intensity without signs/symptoms of physical distress.  Continue to progress workloads to maintain intensity without signs/symptoms of physical distress.  Continue to progress workloads to maintain intensity without signs/symptoms of physical distress.  Continue to progress workloads to maintain intensity without signs/symptoms of physical distress.  Continue to progress workloads to maintain intensity without signs/symptoms of physical distress.   Average METs  2.5  3.4  3.52  3.51  3.51       Resistance Training    Row Name 04/15/18 1330 04/29/18 1122 05/15/18 1315 06/04/18 1554 06/10/18 1607   Training Prescription  Yes  Yes  No  Yes  Yes   Weight  2lbs  2lbs  no documentation  2 lbs.   2 lbs.    Reps  10-15  10-15  no documentation  10-15  10-15   Time  10  Minutes  10 Minutes  no documentation  10 Minutes  10 Minutes       Interval Training    Row Name 04/15/18 1330 04/29/18 1122 05/15/18 1315 06/04/18 1554 06/10/18 1607   Interval Training  No  No  No  No  No       Treadmill    Row Name 04/15/18 1330 04/29/18 1122 05/15/18 1315 06/04/18 1554 06/10/18 1607   MPH  no documentation  no documentation  no documentation  no documentation  no documentation   Grade  no documentation  no documentation  no documentation  no documentation  no documentation   Minutes  no documentation  no documentation  no documentation  no documentation  no documentation   METs  no documentation  no documentation  no documentation  no documentation  no documentation       [place] Name 04/15/18 1330 04/29/18 1122 05/15/18 1315 06/04/18 1554 06/10/18 1607   Level  0.'7  1  1  1  1   '$ Minutes  '10  10  10  10  10   '$ METs  3.03  3.93  3.86  3.86  3.86       NuStep    Row Name 04/15/18 1330 04/29/18 1122 05/15/18 1315 06/04/18 1554 06/10/18 1607   Level  '2  2  3  3  3   '$ SPM  85  85  85  85  85   Minutes  '10  10  10  10  10   '$ METs  1.9  2.9  3.1  2.7  2.7       Track    Row Name 04/15/18 1330 04/29/18 1122 05/15/18 1315 06/04/18 1554 06/10/18 1607   Laps  '9  14  15  15  15   '$ Minutes  '10  10  10  10  10   '$ METs  2.57  3.44  3.6  3.96  3.96       Home Exercise Plan    Row Name 04/15/18 1330 04/29/18 1122 05/15/18 1315 06/04/18 1554 06/10/18 1607   Plans to continue exercise at  no documentation  no documentation  no documentation  no documentation  Home (comment)   Frequency  no documentation  no documentation  no documentation  no documentation  Add 3 additional days to program exercise sessions.   Initial Home Exercises Provided  no documentation  no documentation  no documentation  no documentation  04/24/18       Response to Exercise    Row Name 06/24/18 1500   Blood Pressure (Admit)  120/70   Blood Pressure (Exercise)  104/72   Blood Pressure (Exit)   104/72   Heart Rate (Admit)  71 bpm   Heart Rate (Exercise)  107 bpm   Heart Rate (Exit)  61 bpm   Rating of Perceived Exertion (Exercise)  13   Symptoms  none   Duration  Progress to 30 minutes of  aerobic without signs/symptoms of physical distress   Intensity  THRR unchanged       Progression    Row Name 06/24/18 1500   Progression  Continue to progress workloads to maintain intensity without signs/symptoms of physical distress.   Average METs  3.5       Resistance Training    Row Name 06/24/18 1500   Training Prescription  Yes   Weight  2 lbs.    Reps  10-15   Time  10 Minutes       Interval Training    Row Name 06/24/18 1500   Interval Training  No       Bike    Row Name 06/24/18 1500   Level  1   Minutes  10   METs  3.9       NuStep    Row Name 06/24/18 1500   Level  3   SPM  85   Minutes  10   METs  3       Track    Row Name 06/24/18 1500   Laps  15   Minutes  10  METs  3.6       Home Exercise Plan    Row Name 06/24/18 1500   Plans to continue exercise at  Home (comment)   Frequency  Add 3 additional days to program exercise sessions.   Initial Home Exercises Provided  04/24/18          Exercise Comments: Exercise Comments    Row Name 04/15/18 1423 04/24/18 1410 05/01/18 1612 05/16/18 0801 06/05/18 1454   Exercise Comments  Patient tolerated first session of exercise well without c/o.  Reviewed home exericse guidelins, METs, and goals with patient.  Reviewed METs and goals with patient.   Pt MET level has increased. Will continue to exercise at home.   Reveiwed METs and goals with Pt. Will continue exercising at a local gym.    [place] Name 06/26/18 1025   Exercise Comments  Reveiwed METs and goals with Pt. Pt was responsive and understood goals.       Exercise Goals and Review: Exercise Goals    Exercise Goals    Row Name 04/09/18 1402   Increase Physical Activity  Yes   Intervention  Provide advice, education, support and counseling  about physical activity/exercise needs.;Develop an individualized exercise prescription for aerobic and resistive training based on initial evaluation findings, risk stratification, comorbidities and participant's personal goals.   Expected Outcomes  Short Term: Attend rehab on a regular basis to increase amount of physical activity.;Long Term: Exercising regularly at least 3-5 days a week.;Long Term: Add in home exercise to make exercise part of routine and to increase amount of physical activity.   Increase Strength and Stamina  Yes   Intervention  Provide advice, education, support and counseling about physical activity/exercise needs.;Develop an individualized exercise prescription for aerobic and resistive training based on initial evaluation findings, risk stratification, comorbidities and participant's personal goals.   Expected Outcomes  Short Term: Increase workloads from initial exercise prescription for resistance, speed, and METs.;Short Term: Perform resistance training exercises routinely during rehab and add in resistance training at home;Long Term: Improve cardiorespiratory fitness, muscular endurance and strength as measured by increased METs and functional capacity (6MWT)   Able to understand and use rate of perceived exertion (RPE) scale  Yes   Intervention  Provide education and explanation on how to use RPE scale   Expected Outcomes  Short Term: Able to use RPE daily in rehab to express subjective intensity level;Long Term:  Able to use RPE to guide intensity level when exercising independently   Knowledge and understanding of Target Heart Rate Range (THRR)  Yes   Intervention  Provide education and explanation of THRR including how the numbers were predicted and where they are located for reference   Expected Outcomes  Short Term: Able to state/look up THRR;Long Term: Able to use THRR to govern intensity when exercising independently;Short Term: Able to use daily as guideline for  intensity in rehab   Able to check pulse independently  Yes   Intervention  Provide education and demonstration on how to check pulse in carotid and radial arteries.;Review the importance of being able to check your own pulse for safety during independent exercise   Expected Outcomes  Short Term: Able to explain why pulse checking is important during independent exercise;Long Term: Able to check pulse independently and accurately   Understanding of Exercise Prescription  Yes   Intervention  Provide education, explanation, and written materials on patient's individual exercise prescription   Expected Outcomes  Short Term: Able to explain program  exercise prescription;Long Term: Able to explain home exercise prescription to exercise independently          Exercise Goals Re-Evaluation : Exercise Goals Re-Evaluation    Exercise Goal Re-Evaluation    Row Name 04/15/18 1423 04/24/18 1410 05/01/18 1610 05/16/18 0800 06/05/18 1453   Exercise Goals Review  Able to understand and use rate of perceived exertion (RPE) scale;Increase Physical Activity  Able to understand and use rate of perceived exertion (RPE) scale;Increase Physical Activity;Understanding of Exercise Prescription;Knowledge and understanding of Target Heart Rate Range (THRR)  Increase Physical Activity;Increase Strength and Stamina;Able to check pulse independently;Understanding of Exercise Prescription;Knowledge and understanding of Target Heart Rate Range (THRR);Able to understand and use rate of perceived exertion (RPE) scale  Increase Physical Activity;Increase Strength and Stamina;Able to check pulse independently;Understanding of Exercise Prescription;Knowledge and understanding of Target Heart Rate Range (THRR);Able to understand and use rate of perceived exertion (RPE) scale  Increase Physical Activity;Increase Strength and Stamina;Able to check pulse independently;Understanding of Exercise Prescription;Knowledge and understanding of  Target Heart Rate Range (THRR);Able to understand and use rate of perceived exertion (RPE) scale   Comments  Patient able to understand and use RPE scale appropriately.  Reviewed home exercise guidelines including THRR, RPE scale, and endpoints for exercise. Discussed attending pulse counting class with patient. Patient is walking 1-2 miles daily as her mode of home exercise.  Patient will continue to walking 1 to 2 miles in addition to CR exercsise.   Pt MET level has increased to 3.52. Pt is toleratring exercise prescription well and exercising at home in addition to Cardiac Rehab.   Reviewed METs and goals with Pt. Pt has a MET level of 3.51. Will continue exercising at a local gym by taking exercise classes.    Expected Outcomes  Increase workloads as tolerated to help improve cardiorespiratory fitness.  Patient will continue daily walking in addition to exercise at cardiac rehab to help achieve personal health and fitness goals.  Will continue to monitor and progress as tolerated.   Will continue to monitor and progress Pt as tolerated.   Will continue to monitor and progress Pt as tolerated.        Exercise Goal Re-Evaluation    Row Name 06/26/18 1024   Exercise Goals Review  Increase Physical Activity;Increase Strength and Stamina;Able to check pulse independently;Knowledge and understanding of Target Heart Rate Range (THRR);Understanding of Exercise Prescription;Able to understand and use rate of perceived exertion (RPE) scale   Comments  Reviewed METs and goals with Pt. Pt has a MET level of 3.5 and has not increased from last check. Recommended Pt to increase MET level with each session to increase average. Will continue exercising at a local gym by taking exercise classes.    Expected Outcomes  Will continue to monitor and progress Pt as tolerated.           Discharge Exercise Prescription (Final Exercise Prescription Changes): Exercise Prescription Changes - 06/24/18 1500    Response to  Exercise          Blood Pressure (Admit)  120/70    Blood Pressure (Exercise)  104/72    Blood Pressure (Exit)  104/72    Heart Rate (Admit)  71 bpm    Heart Rate (Exercise)  107 bpm    Heart Rate (Exit)  61 bpm    Rating of Perceived Exertion (Exercise)  13    Symptoms  none    Duration  Progress to 30 minutes of  aerobic without signs/symptoms  of physical distress    Intensity  THRR unchanged        Progression          Progression  Continue to progress workloads to maintain intensity without signs/symptoms of physical distress.    Average METs  3.5        Resistance Training          Training Prescription  Yes    Weight  2 lbs.     Reps  10-15    Time  10 Minutes        Interval Training          Interval Training  No        Bike          Level  1    Minutes  10    METs  3.9        NuStep          Level  3    SPM  85    Minutes  10    METs  3        Track          Laps  15    Minutes  10    METs  3.6        Home Exercise Plan          Plans to continue exercise at  Home (comment)    Frequency  Add 3 additional days to program exercise sessions.    Initial Home Exercises Provided  04/24/18           Nutrition:  Target Goals: Understanding of nutrition guidelines, daily intake of sodium '1500mg'$ , cholesterol '200mg'$ , calories 30% from fat and 7% or less from saturated fats, daily to have 5 or more servings of fruits and vegetables.  Biometrics: Pre Biometrics - 04/09/18 1350    Pre Biometrics          Height  4' 10.75" (1.492 m)    Weight  65 kg    Waist Circumference  32 inches    Hip Circumference  41.75 inches    Waist to Hip Ratio  0.77 %    BMI (Calculated)  29.2    Triceps Skinfold  31 mm    % Body Fat  40 %    Grip Strength  29.5 kg    Flexibility  18 in    Single Leg Stand  30 seconds          Post Biometrics - 06/10/18 1450     Post  Biometrics          Height  4' 10.75" (1.492 m)    Weight  65.9 kg    Waist  Circumference  32 inches    Hip Circumference  42 inches    Waist to Hip Ratio  0.76 %    BMI (Calculated)  29.6    Triceps Skinfold  32 mm    % Body Fat  40.4 %    Grip Strength  30 kg    Flexibility  17.5 in    Single Leg Stand  2.75 seconds           Nutrition Therapy Plan and Nutrition Goals:   Nutrition Assessments:   Nutrition Goals Re-Evaluation:   Nutrition Goals Re-Evaluation:   Nutrition Goals Discharge (Final Nutrition Goals Re-Evaluation):   Psychosocial: Target Goals: Acknowledge presence or absence of significant depression and/or stress, maximize coping skills, provide positive support system. Participant is able  to verbalize types and ability to use techniques and skills needed for reducing stress and depression.  Initial Review & Psychosocial Screening: Initial Psych Review & Screening - 04/09/18 1438    Initial Review          Current issues with  Current Stress Concerns;Current Anxiety/Panic    Source of Stress Concerns  Chronic Illness;Unable to perform yard/household activities;Unable to participate in former interests or hobbies        [place]?  No        Barriers          Psychosocial barriers to participate in program  The patient should benefit from training in stress management and relaxation.        Screening Interventions          Interventions  Encouraged to exercise           Quality of Life Scores: Quality of Life - 04/09/18 1453    Quality of Life          Select  Quality of Life        Quality of Life Scores          Health/Function Pre  30 %    Socioeconomic Pre  30 %    Psych/Spiritual Pre  30 %    Family Pre  30 %    GLOBAL Pre  30 %          Scores of 19 and below usually indicate a poorer quality of life in these areas.  A difference of  2-3 points is a clinically meaningful difference.  A difference of 2-3 points in the total score of the Quality of Life Index has  been associated with significant improvement in overall quality of life, self-image, physical symptoms, and general health in studies assessing change in quality of life.  PHQ-9: Recent Review Flowsheet Data    There is no flowsheet data to display.     Interpretation of Total Score  Total Score Depression Severity:  1-4 = Minimal depression, 5-9 = Mild depression, 10-14 = Moderate depression, 15-19 = Moderately severe depression, 20-27 = Severe depression   Psychosocial Evaluation and Intervention: Psychosocial Evaluation - 04/18/18 0744    Psychosocial Evaluation & Interventions          Interventions  Encouraged to exercise with the program and follow exercise prescription;Stress management education;Relaxation education    Comments  pt with health related stress and anxiety.      Expected Outcomes  pt will exhibit improved outlook and coping skills.     Continue Psychosocial Services   Follow up required by staff           Psychosocial Re-Evaluation: Psychosocial Re-Evaluation    Psychosocial Re-Evaluation    [place] Name 05/16/18 1152 06/06/18 1219 06/26/18 1720   Current issues with  Current Stress Concerns;Current Anxiety/Panic  Current Stress Concerns;Current Anxiety/Panic  Current Stress Concerns;Current Anxiety/Panic   Comments  pt with health related stress and anxiety verbalizes improved confidence in her CR participation.    pt with health related stress and anxiety verbalizes improved confidence in her CR participation.    pt with health related stress and anxiety verbalizes improved confidence in her CR participation.     Expected Outcomes  pt will exhibit positive outlook with good coping skills.   pt will exhibit positive outlook with good coping skills.   pt will exhibit positive outlook  with good coping skills.    Interventions  Encouraged to attend Cardiac Rehabilitation for the exercise;Stress management education;Relaxation education  Encouraged to attend Cardiac  Rehabilitation for the exercise;Stress management education;Relaxation education  Encouraged to attend Cardiac Rehabilitation for the exercise;Stress management education;Relaxation education       Initial Review    Row Name 05/16/18 1152 06/06/18 1219 06/26/18 1720   Source of Stress Concerns  Chronic Illness;Unable to perform yard/household activities;Unable to participate in former interests or hobbies  no documentation  Chronic Illness;Unable to perform yard/household activities;Unable to participate in former interests or hobbies          Psychosocial Discharge (Final Psychosocial Re-Evaluation): Psychosocial Re-Evaluation - 06/26/18 1720    Psychosocial Re-Evaluation          Current issues with  Current Stress Concerns;Current Anxiety/Panic    Comments  pt with health related stress and anxiety verbalizes improved confidence in her CR participation.      Expected Outcomes  pt will exhibit positive outlook with good coping skills.     Interventions  Encouraged to attend Cardiac Rehabilitation for the exercise;Stress management education;Relaxation education        Initial Review          Source of Stress Concerns  Chronic Illness;Unable to perform yard/household activities;Unable to participate in former interests or hobbies           Vocational Rehabilitation: Provide vocational rehab assistance to qualifying candidates.   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 Participant will state understanding/return demonstration of topics presented.  Learning Barriers/Preferences:   Education Topics: Count Your Pulse:  -Group instruction provided by verbal instruction,  demonstration, patient participation and written materials to support subject.  Instructors address importance of being able to find your pulse and how to count your pulse when at home without a heart monitor.  Patients get hands on experience counting their pulse with staff help and individually. Flowsheet Row CARDIAC REHAB PHASE II EXERCISE from 06/26/2018 in [place]  Date  05/10/18  Instruction Review Code  2- Demonstrated Understanding      Heart Attack, Angina, and Risk Factor Modification:  -Group instruction provided by verbal instruction, video, and written materials to support subject.  Instructors address signs and symptoms of angina and heart attacks.    Also discuss risk factors for heart disease and how to make changes to improve heart health risk factors. Flowsheet Row CARDIAC REHAB PHASE II EXERCISE from 06/26/2018 in [place]  Date  06/26/18  Instruction Review Code  2- Demonstrated Understanding      Functional Fitness:  -Group instruction provided by verbal instruction, demonstration, patient participation, and written materials to support subject.  Instructors address safety measures for doing things around the house.  Discuss how to get up and down off the floor, how to pick things up properly, how to safely get out of a chair without assistance, and balance training. Flowsheet Row CARDIAC REHAB PHASE II EXERCISE from 06/26/2018 in [place]  Date  05/31/18  Educator  EP  Instruction Review Code  2- Demonstrated Understanding      Meditation and Mindfulness:  -Group instruction provided by verbal instruction, patient participation, and written materials to support subject.  Instructor addresses importance of mindfulness and meditation practice to help reduce stress and improve awareness.  Instructor also leads participants through a meditation exercise.  Flowsheet Row  CARDIAC REHAB PHASE II EXERCISE from 06/26/2018 in [place]  Date  05/08/18  Educator  RN  Instruction Review Code  2- Demonstrated Understanding      Stretching for Flexibility and Mobility:  -Group instruction provided by verbal instruction, patient participation, and written materials to support subject.  Instructors lead participants through series of stretches that are designed to increase flexibility thus improving mobility.  These stretches are additional exercise for major muscle groups that are typically performed during regular warm up and cool down.   Hands Only CPR:  -Group verbal, video, and participation provides a basic overview of AHA guidelines for community CPR. Role-play of emergencies allow participants the opportunity to practice calling for help and chest compression technique with discussion of AED use.   Hypertension: -Group verbal and written instruction that provides a basic overview of hypertension including the most recent diagnostic guidelines, risk factor reduction with self-care instructions and medication management. Flowsheet Row CARDIAC REHAB PHASE II EXERCISE from 06/26/2018 in [place]  Date  06/05/18  Instruction Review Code  2- Demonstrated Understanding       Nutrition I class: Heart Healthy Eating:  -Group instruction provided by PowerPoint slides, verbal discussion, and written materials to support subject matter. The instructor gives an explanation and review of the Therapeutic Lifestyle Changes diet recommendations, which includes a discussion on lipid goals, dietary fat, sodium, fiber, plant stanol/sterol esters, sugar, and the components of a well-balanced, healthy diet.   Nutrition II class: Lifestyle Skills:  -Group instruction provided by PowerPoint slides, verbal discussion, and written materials to support subject matter. The instructor gives an explanation and review of  label reading, grocery shopping for heart health, heart healthy recipe modifications, and ways to make healthier choices when eating out.   Diabetes Question & Answer:  -Group instruction provided by PowerPoint slides, verbal discussion, and written materials to support subject matter. The instructor gives an explanation and review of diabetes co-morbidities, pre- and post-prandial blood glucose goals, pre-exercise blood glucose goals, signs, symptoms, and treatment of hypoglycemia and hyperglycemia, and foot care basics.   Diabetes Blitz:  -Group instruction provided by PowerPoint slides, verbal discussion, and written materials to support subject matter. The instructor gives an explanation and review of the physiology behind type 1 and type 2 diabetes, diabetes medications and rational behind using different medications, pre- and post-prandial blood glucose recommendations and Hemoglobin A1c goals, diabetes diet, and exercise including blood glucose guidelines for exercising safely.    Portion Distortion:  -Group instruction provided by PowerPoint slides, verbal discussion, written materials, and food models to support subject matter. The instructor gives an explanation of serving size versus portion size, changes in portions sizes over the last 20 years, and what consists of a serving from each food group.   Stress Management:  -Group instruction provided by verbal instruction, video, and written materials to support subject matter.  Instructors review role of stress in heart disease and how to cope with stress positively.   Flowsheet Row CARDIAC REHAB PHASE II EXERCISE from 06/26/2018 in [place]  Date  05/22/18  Instruction Review Code  2- Demonstrated Understanding      Exercising on Your Own:  -Group instruction provided by verbal instruction, power point, and written materials to support subject.  Instructors discuss benefits of exercise, components  of exercise, frequency and intensity of exercise, and end points for exercise.  Also discuss use of nitroglycerin and activating EMS.  Review options of places to exercise outside of rehab.  Review guidelines for sex with heart disease. Flowsheet Row CARDIAC REHAB PHASE II EXERCISE from 06/26/2018 in [place]  Date  05/01/18  Instruction Review Code  2- Demonstrated Understanding      Cardiac Drugs I:  -Group instruction provided by verbal instruction and written materials to support subject.  Instructor reviews cardiac drug classes: antiplatelets, anticoagulants, beta blockers, and statins.  Instructor discusses reasons, side effects, and lifestyle considerations for each drug class. Flowsheet Row CARDIAC REHAB PHASE II EXERCISE from 06/26/2018 in [place]  Date  05/29/18  Educator  Pharmacist  Instruction Review Code  2- Demonstrated Understanding      Cardiac Drugs II:  -Group instruction provided by verbal instruction and written materials to support subject.  Instructor reviews cardiac drug classes: angiotensin converting enzyme inhibitors (ACE-I), angiotensin II receptor blockers (ARBs), nitrates, and calcium channel blockers.  Instructor discusses reasons, side effects, and lifestyle considerations for each drug class. Flowsheet Row CARDIAC REHAB PHASE II EXERCISE from 06/26/2018 in [place]  Date  04/24/18  Educator  Pharmacist  Instruction Review Code  2- Demonstrated Understanding      Anatomy and Physiology of the Circulatory System:  Group verbal and written instruction and models provide basic cardiac anatomy and physiology, with the coronary electrical and arterial systems. Review of: AMI, Angina, Valve disease, Heart Failure, Peripheral Artery Disease, Cardiac Arrhythmia, Pacemakers, and the ICD. Flowsheet Row CARDIAC REHAB PHASE II EXERCISE from 06/26/2018 in [place]  Date  06/12/18  Educator  RN  Instruction Review Code  2- Demonstrated Understanding      Other Education:  -Group or individual verbal, written, or video instructions that support the educational goals of the cardiac rehab program.   Holiday Eating Survival Tips:  -Group instruction provided by PowerPoint slides, verbal discussion, and written materials to support subject matter. The instructor gives patients tips, tricks, and techniques to help them not only survive but enjoy the holidays despite the onslaught of food that accompanies the holidays.   Knowledge Questionnaire Score: Knowledge Questionnaire Score - 04/09/18 1440    Knowledge Questionnaire Score          Pre Score  18/24           Core Components/Risk Factors/Patient Goals at Admission: Personal Goals and Risk Factors at Admission - 04/09/18 1433    Core Components/Risk Factors/Patient Goals on Admission           Weight Management  Obesity;Yes    Intervention  Weight Management: Develop a combined nutrition and exercise program designed to reach desired caloric intake, while maintaining appropriate intake of nutrient and fiber, sodium and fats, and appropriate energy expenditure required for the weight goal.;Weight Management: Provide education and appropriate resources to help participant work on and attain dietary goals.;Weight Management/Obesity: Establish reasonable short term and long term weight goals.;Obesity: Provide education and appropriate resources to help participant work on and attain dietary goals.    Expected Outcomes  Short Term: Continue to assess and modify interventions until short term  weight is achieved;Long Term: Adherence to nutrition and physical activity/exercise program aimed toward attainment of established weight goal;Weight Loss: Understanding of general recommendations for a balanced deficit meal plan, which promotes 1-2 lb weight loss per week and includes  a negative energy balance of [phone] kcal/d    Heart Failure  Yes    Intervention  Provide a combined exercise and nutrition program that is supplemented with education, support and counseling about heart failure. Directed toward relieving symptoms such as shortness of breath, decreased exercise tolerance, and extremity edema.    Expected Outcomes  Improve functional capacity of life;Short term: Daily weights obtained and reported for increase. Utilizing diuretic protocols set by physician.;Short term: Attendance in program 2-3 days a week with increased exercise capacity. Reported lower sodium intake. Reported increased fruit and vegetable intake. Reports medication compliance.;Long term: Adoption of self-care skills and reduction of barriers for early signs and symptoms recognition and intervention leading to self-care maintenance.    Lipids  Yes    Intervention  Provide education and support for participant on nutrition & aerobic/resistive exercise along with prescribed medications to achieve LDL '70mg'$ , HDL >'40mg'$ .    Expected Outcomes  Short Term: Participant states understanding of desired cholesterol values and is compliant with medications prescribed. Participant is following exercise prescription and nutrition guidelines.;Long Term: Cholesterol controlled with medications as prescribed, with individualized exercise RX and with personalized nutrition plan. Value goals: LDL < '70mg'$ , HDL > 40 mg.    Stress  Yes    Intervention  Offer individual and/or small group education and counseling on adjustment to heart disease, stress management and health-related lifestyle change. Teach and support self-help strategies.;Refer participants experiencing significant psychosocial distress to appropriate mental health specialists for further evaluation and treatment. When possible, include family members and significant others in education/counseling sessions.    Expected Outcomes  Short Term: Participant  demonstrates changes in health-related behavior, relaxation and other stress management skills, ability to obtain effective social support, and compliance with psychotropic medications if prescribed.;Long Term: Emotional wellbeing is indicated by absence of clinically significant psychosocial distress or social isolation.           Core Components/Risk Factors/Patient Goals Review:  Goals and Risk Factor Review    Core Components/Risk Factors/Patient Goals Review    Row Name 04/18/18 0746 05/16/18 1154 06/06/18 1220 06/26/18 1720   Personal Goals Review  Weight Management/Obesity;Heart Failure;Lipids;Stress  Weight Management/Obesity;Heart Failure;Lipids;Stress  Weight Management/Obesity;Heart Failure;Lipids;Stress  Weight Management/Obesity;Heart Failure;Lipids;Stress   Review  pt with multiple CAD RF demonstrates willingness to participate in CR program. pt encouraged to participate in exercise and education opportunities.   pt with multiple CAD RF demonstrates willingness to participate in CR program. pt encouraged to participate in exercise and education opportunities. pt reports consistency in HEP and eagerness to maintain healthy lifestyle.   pt with multiple CAD RF demonstrates willingness to participate in CR program. pt encouraged to participate in exercise and education opportunities. pt reports consistency in HEP and eagerness to maintain healthy lifestyle.   pt with multiple CAD RF demonstrates willingness to participate in CR program. pt encouraged to participate in exercise and education opportunities. pt reports consistency in HEP and eagerness to maintain healthy lifestyle.    Expected Outcomes  pt will participate in CR exercise, nutrition and lifestyle modification opportunities to decrease overall RF.   pt will participate in CR exercise, nutrition and lifestyle modification opportunities to decrease overall RF.   pt will participate in CR exercise, nutrition and lifestyle modification  opportunities to decrease overall RF.   pt will participate in CR exercise, nutrition and lifestyle modification opportunities to decrease overall RF.           Core Components/Risk Factors/Patient Goals at Discharge (Final Review):  Goals and Risk Factor Review - 06/26/18 1720    Core Components/Risk Factors/Patient Goals Review          Personal Goals Review  Weight Management/Obesity;Heart Failure;Lipids;Stress    Review  pt with multiple CAD RF demonstrates willingness to participate in CR program. pt encouraged to participate in exercise and education opportunities. pt reports consistency in HEP and eagerness to maintain healthy lifestyle.     Expected Outcomes  pt will participate in CR exercise, nutrition and lifestyle modification opportunities to decrease overall RF.            ITP Comments: ITP Comments    Row Name 04/09/18 1353 04/18/18 0744 05/16/18 1151 06/06/18 1218 06/26/18 1720   ITP Comments  Dr.Traci Radford Pax, Medical Director   30 day ITP review. pt recently started group exercise program.    30 day ITP review. pt with good attendance and participation. pt demonstrates willingness to participate in CR program.   30 day ITP review. pt with good attendance and participation. pt demonstrates willingness to participate in CR program.   30 day ITP review. pt with good attendance and participation. pt demonstrates willingness to participate in CR program. pt recent absence from viral illness.       Comments:

## 2018-06-28 ENCOUNTER — Encounter (HOSPITAL_COMMUNITY)
Admission: RE | Admit: 2018-06-28 | Discharge: 2018-06-28 | Disposition: A | Discharge status: Home / Self Care | Payer: 59 | Source: Ambulatory Visit | Attending: Internal Medicine | Admitting: Internal Medicine

## 2018-06-28 ENCOUNTER — Encounter (HOSPITAL_COMMUNITY): Payer: 59

## 2018-06-28 DIAGNOSIS — I214 Non-ST elevation (NSTEMI) myocardial infarction: Secondary | ICD-10-CM

## 2018-06-28 DIAGNOSIS — Z713 Dietary counseling and surveillance: Secondary | ICD-10-CM | POA: Diagnosis not present

## 2018-06-28 DIAGNOSIS — I5020 Unspecified systolic (congestive) heart failure: Secondary | ICD-10-CM

## 2018-07-01 ENCOUNTER — Encounter (HOSPITAL_COMMUNITY)
Admission: RE | Admit: 2018-07-01 | Discharge: 2018-07-01 | Disposition: A | Discharge status: Home / Self Care | Payer: 59 | Source: Ambulatory Visit | Attending: Internal Medicine | Admitting: Internal Medicine

## 2018-07-01 ENCOUNTER — Other Ambulatory Visit (HOSPITAL_COMMUNITY): Payer: Self-pay | Admitting: Physician Assistant

## 2018-07-01 ENCOUNTER — Encounter (HOSPITAL_COMMUNITY): Payer: 59

## 2018-07-01 DIAGNOSIS — I5020 Unspecified systolic (congestive) heart failure: Secondary | ICD-10-CM

## 2018-07-01 DIAGNOSIS — I214 Non-ST elevation (NSTEMI) myocardial infarction: Secondary | ICD-10-CM

## 2018-07-01 DIAGNOSIS — Z713 Dietary counseling and surveillance: Secondary | ICD-10-CM | POA: Diagnosis not present

## 2018-07-01 NOTE — Telephone Encounter (Signed)
This is a CHF pt 

## 2018-07-03 ENCOUNTER — Encounter (HOSPITAL_COMMUNITY)
Admission: RE | Admit: 2018-07-03 | Discharge: 2018-07-03 | Disposition: A | Discharge status: Home / Self Care | Payer: 59 | Source: Ambulatory Visit | Attending: Internal Medicine | Admitting: Internal Medicine

## 2018-07-03 ENCOUNTER — Encounter (HOSPITAL_COMMUNITY): Payer: 59

## 2018-07-03 DIAGNOSIS — Z713 Dietary counseling and surveillance: Secondary | ICD-10-CM | POA: Diagnosis not present

## 2018-07-03 DIAGNOSIS — I214 Non-ST elevation (NSTEMI) myocardial infarction: Secondary | ICD-10-CM

## 2018-07-03 DIAGNOSIS — I5020 Unspecified systolic (congestive) heart failure: Secondary | ICD-10-CM

## 2018-07-05 ENCOUNTER — Encounter (HOSPITAL_COMMUNITY): Payer: 59

## 2018-07-05 ENCOUNTER — Encounter (HOSPITAL_COMMUNITY)
Admission: RE | Admit: 2018-07-05 | Discharge: 2018-07-05 | Disposition: A | Discharge status: Home / Self Care | Payer: 59 | Source: Ambulatory Visit | Attending: Internal Medicine | Admitting: Internal Medicine

## 2018-07-05 DIAGNOSIS — Z713 Dietary counseling and surveillance: Secondary | ICD-10-CM | POA: Diagnosis not present

## 2018-07-05 DIAGNOSIS — I5020 Unspecified systolic (congestive) heart failure: Secondary | ICD-10-CM

## 2018-07-05 DIAGNOSIS — I214 Non-ST elevation (NSTEMI) myocardial infarction: Secondary | ICD-10-CM

## 2018-07-08 ENCOUNTER — Encounter (HOSPITAL_COMMUNITY)
Admission: RE | Admit: 2018-07-08 | Discharge: 2018-07-08 | Disposition: A | Discharge status: Home / Self Care | Payer: 59 | Source: Ambulatory Visit | Attending: Internal Medicine | Admitting: Internal Medicine

## 2018-07-08 ENCOUNTER — Encounter (HOSPITAL_COMMUNITY): Payer: 59

## 2018-07-08 DIAGNOSIS — I214 Non-ST elevation (NSTEMI) myocardial infarction: Secondary | ICD-10-CM

## 2018-07-08 DIAGNOSIS — I5020 Unspecified systolic (congestive) heart failure: Secondary | ICD-10-CM

## 2018-07-08 DIAGNOSIS — Z713 Dietary counseling and surveillance: Secondary | ICD-10-CM | POA: Diagnosis not present

## 2018-07-12 ENCOUNTER — Encounter (HOSPITAL_COMMUNITY): Payer: 59

## 2018-07-12 ENCOUNTER — Encounter (HOSPITAL_COMMUNITY)
Admission: RE | Admit: 2018-07-12 | Discharge: 2018-07-12 | Disposition: A | Discharge status: Home / Self Care | Payer: 59 | Source: Ambulatory Visit | Attending: Internal Medicine | Admitting: Internal Medicine

## 2018-07-12 DIAGNOSIS — Z713 Dietary counseling and surveillance: Secondary | ICD-10-CM | POA: Diagnosis not present

## 2018-07-12 DIAGNOSIS — I214 Non-ST elevation (NSTEMI) myocardial infarction: Secondary | ICD-10-CM

## 2018-07-12 DIAGNOSIS — I5020 Unspecified systolic (congestive) heart failure: Secondary | ICD-10-CM

## 2018-07-15 ENCOUNTER — Encounter (HOSPITAL_COMMUNITY): Payer: Self-pay

## 2018-07-15 ENCOUNTER — Encounter (HOSPITAL_COMMUNITY): Payer: 59

## 2018-07-15 ENCOUNTER — Encounter (HOSPITAL_COMMUNITY)
Admission: RE | Admit: 2018-07-15 | Discharge: 2018-07-15 | Disposition: A | Discharge status: Home / Self Care | Payer: 59 | Source: Ambulatory Visit | Attending: Internal Medicine | Admitting: Internal Medicine

## 2018-07-15 DIAGNOSIS — Z713 Dietary counseling and surveillance: Secondary | ICD-10-CM | POA: Diagnosis not present

## 2018-07-15 DIAGNOSIS — I214 Non-ST elevation (NSTEMI) myocardial infarction: Secondary | ICD-10-CM

## 2018-07-15 DIAGNOSIS — I5020 Unspecified systolic (congestive) heart failure: Secondary | ICD-10-CM

## 2018-07-23 NOTE — Progress Notes (Signed)
Discharge Progress Report  Patient Details  Name: Savannah Bell MRN: [id number] Date of Birth: [birth year] Referring Provider:   Flowsheet Row CARDIAC REHAB PHASE II ORIENTATION from 04/09/2018 in [place]  Referring Provider  Glori Bickers, MD       Number of Visits: 76  Reason for Discharge:  Patient reached a stable level of exercise.  Smoking History:  Social History   Tobacco Use  Smoking Status Former Smoker  . Packs/day: 0.50  . Years: 10.00  . Pack years: 5.00  . Types: Cigarettes  . Last attempt to quit: 1983  . Years since quitting: 37.0  Smokeless Tobacco Former Systems developer  . Types: Snuff, Chew  . Quit date: 2000    Diagnosis:  Systolic congestive heart failure, unspecified HF chronicity ([place])  12/11/17 NSTEMI  ADL [facility]:   Initial Exercise Prescription: Initial Exercise Prescription - 04/09/18 1400    Date of Initial Exercise RX and Referring Provider          Date  04/09/18    Referring Provider  Glori Bickers, MD    Expected Discharge Date  07/15/18        Treadmill          MPH  2.5    Grade  0    Minutes  10    METs  2.91        Bike          Level  0.7    Minutes  10    METs  3.03        NuStep          Level  2    SPM  85    Minutes  10    METs  2.5        Prescription Details          Frequency (times per week)  3    Duration  Progress to 30 minutes of continuous aerobic without signs/symptoms of physical distress        Intensity          THRR 40-80% of Max Heartrate  62-123    Ratings of Perceived Exertion  11-13    Perceived Dyspnea  0-4        Progression          Progression  Continue to progress workloads to maintain intensity without signs/symptoms of physical distress.        Resistance Training          Training Prescription  Yes    Weight  2lbs    Reps  10-15           Discharge Exercise Prescription (Final Exercise Prescription Changes): Exercise  Prescription Changes - 07/15/18 1400    Response to Exercise          Blood Pressure (Admit)  100/64    Blood Pressure (Exercise)  102/78    Blood Pressure (Exit)  92/62    Heart Rate (Admit)  72 bpm    Heart Rate (Exercise)  102 bpm    Heart Rate (Exit)  74 bpm    Rating of Perceived Exertion (Exercise)  13    Symptoms  none    Duration  Progress to 30 minutes of  aerobic without signs/symptoms of physical distress    Intensity  THRR unchanged        Progression          Progression  Continue to progress workloads  to maintain intensity without signs/symptoms of physical distress.    Average METs  3.6        Resistance Training          Training Prescription  Yes    Weight  3 lbs.     Reps  10-15    Time  10 Minutes        Interval Training          Interval Training  No        Bike          Level  1    Minutes  10    METs  3.86        NuStep          Level  4    SPM  85    Minutes  10    METs  2.9        Track          Laps  16    Minutes  10    METs  3.96        Home Exercise Plan          Plans to continue exercise at  Home (comment)    Frequency  Add 3 additional days to program exercise sessions.    Initial Home Exercises Provided  04/24/18           Functional Capacity: 6 Minute Walk    6 Minute Walk    Row Name 04/09/18 1419 06/10/18 1448   Phase  Initial  Discharge   Distance  1480 feet  1727 feet   Distance % Change  no documentation  16.69 %   Distance Feet Change  no documentation  247 ft   Walk Time  6 minutes  6 minutes   # of Rest Breaks  0  0   MPH  2.8  3.27   METS  2.92  3.48   RPE  9  11   Perceived Dyspnea   0  no documentation   VO2 Peak  10.21  12.17   Symptoms  No  No   Resting HR  62 bpm  88 bpm   Resting BP  94/60  102/60   Resting Oxygen Saturation   100 %  no documentation   Exercise Oxygen Saturation  during 6 min walk  97 %  no documentation   Max Ex. HR  93 bpm  94 bpm   Max Ex. BP  102/78  122/70   2  Minute Post BP  102/74  120/70          Psychological, QOL, Others - Outcomes: PHQ 2/9: No flowsheet data found.  Quality of Life: Quality of Life - 07/15/18 1427    Quality of Life Scores          Health/Function Post  30 %    Socioeconomic Post  30 %    Psych/Spiritual Post  30 %    Family Post  30 %    GLOBAL Post  30 %           Personal Goals: Goals established at orientation with interventions provided to work toward goal. Personal Goals and Risk Factors at Admission - 04/09/18 1433    Core Components/Risk Factors/Patient Goals on Admission           Weight Management  Obesity;Yes    Intervention  Weight Management: Develop a combined nutrition and exercise program designed to reach desired caloric  intake, while maintaining appropriate intake of nutrient and fiber, sodium and fats, and appropriate energy expenditure required for the weight goal.;Weight Management: Provide education and appropriate resources to help participant work on and attain dietary goals.;Weight Management/Obesity: Establish reasonable short term and long term weight goals.;Obesity: Provide education and appropriate resources to help participant work on and attain dietary goals.    Expected Outcomes  Short Term: Continue to assess and modify interventions until short term weight is achieved;Long Term: Adherence to nutrition and physical activity/exercise program aimed toward attainment of established weight goal;Weight Loss: Understanding of general recommendations for a balanced deficit meal plan, which promotes 1-2 lb weight loss per week and includes a negative energy balance of [id number] kcal/d    Heart Failure  Yes    Intervention  Provide a combined exercise and nutrition program that is supplemented with education, support and counseling about heart failure. Directed toward relieving symptoms such as shortness of breath, decreased exercise tolerance, and extremity edema.    Expected Outcomes   Improve functional capacity of life;Short term: Daily weights obtained and reported for increase. Utilizing diuretic protocols set by physician.;Short term: Attendance in program 2-3 days a week with increased exercise capacity. Reported lower sodium intake. Reported increased fruit and vegetable intake. Reports medication compliance.;Long term: Adoption of self-care skills and reduction of barriers for early signs and symptoms recognition and intervention leading to self-care maintenance.    Lipids  Yes    Intervention  Provide education and support for participant on nutrition & aerobic/resistive exercise along with prescribed medications to achieve LDL '70mg'$ , HDL >'40mg'$ .    Expected Outcomes  Short Term: Participant states understanding of desired cholesterol values and is compliant with medications prescribed. Participant is following exercise prescription and nutrition guidelines.;Long Term: Cholesterol controlled with medications as prescribed, with individualized exercise RX and with personalized nutrition plan. Value goals: LDL < '70mg'$ , HDL > 40 mg.    Stress  Yes    Intervention  Offer individual and/or small group education and counseling on adjustment to heart disease, stress management and health-related lifestyle change. Teach and support self-help strategies.;Refer participants experiencing significant psychosocial distress to appropriate mental health specialists for further evaluation and treatment. When possible, include family members and significant others in education/counseling sessions.    Expected Outcomes  Short Term: Participant demonstrates changes in health-related behavior, relaxation and other stress management skills, ability to obtain effective social support, and compliance with psychotropic medications if prescribed.;Long Term: Emotional wellbeing is indicated by absence of clinically significant psychosocial distress or social isolation.            Personal Goals  Discharge: Goals and Risk Factor Review    Core Components/Risk Factors/Patient Goals Review    Row Name 04/18/18 0746 05/16/18 1154 06/06/18 1220 06/26/18 1720 07/15/18 1627   Personal Goals Review  Weight Management/Obesity;Heart Failure;Lipids;Stress  Weight Management/Obesity;Heart Failure;Lipids;Stress  Weight Management/Obesity;Heart Failure;Lipids;Stress  Weight Management/Obesity;Heart Failure;Lipids;Stress  Weight Management/Obesity;Heart Failure;Lipids;Stress   Review  pt with multiple CAD RF demonstrates willingness to participate in CR program. pt encouraged to participate in exercise and education opportunities.   pt with multiple CAD RF demonstrates willingness to participate in CR program. pt encouraged to participate in exercise and education opportunities. pt reports consistency in HEP and eagerness to maintain healthy lifestyle.   pt with multiple CAD RF demonstrates willingness to participate in CR program. pt encouraged to participate in exercise and education opportunities. pt reports consistency in HEP and eagerness to maintain healthy lifestyle.   pt with  multiple CAD RF demonstrates willingness to participate in CR program. pt encouraged to participate in exercise and education opportunities. pt reports consistency in HEP and eagerness to maintain healthy lifestyle.   pt with multiple CAD RF demonstrates willingness to participate in CR program. pt encouraged to continue participate in exercise and education opportunities. pt reports consistency in HEP and eagerness to maintain healthy lifestyle. pt plans to continue [facility] her [facility] at [facility] pt confirmed she is following CHF self care such as daily weights, low NA diet and knowing s/s to call MD.     Expected Outcomes  pt will participate in CR exercise, nutrition and lifestyle modification opportunities to decrease overall RF.   pt will participate in CR exercise, nutrition and lifestyle modification opportunities to decrease  overall RF.   pt will participate in CR exercise, nutrition and lifestyle modification opportunities to decrease overall RF.   pt will participate in CR exercise, nutrition and lifestyle modification opportunities to decrease overall RF.   pt will participate in exercise, nutrition and lifestyle modification opportunities to decrease overall RF.           Exercise Goals and Review: Exercise Goals    Exercise Goals    Row Name 04/09/18 1402   Increase Physical Activity  Yes   Intervention  Provide advice, education, support and counseling about physical activity/exercise needs.;Develop an individualized exercise prescription for aerobic and resistive training based on initial evaluation findings, risk stratification, comorbidities and participant's personal goals.   Expected Outcomes  Short Term: Attend rehab on a regular basis to increase amount of physical activity.;Long Term: Exercising regularly at least 3-5 days a week.;Long Term: Add in home exercise to make exercise part of routine and to increase amount of physical activity.   Increase Strength and Stamina  Yes   Intervention  Provide advice, education, support and counseling about physical activity/exercise needs.;Develop an individualized exercise prescription for aerobic and resistive training based on initial evaluation findings, risk stratification, comorbidities and participant's personal goals.   Expected Outcomes  Short Term: Increase workloads from initial exercise prescription for resistance, speed, and METs.;Short Term: Perform resistance training exercises routinely during rehab and add in resistance training at home;Long Term: Improve cardiorespiratory fitness, muscular endurance and strength as measured by increased METs and functional capacity (6MWT)   Able to understand and use rate of perceived exertion (RPE) scale  Yes   Intervention  Provide education and explanation on how to use RPE scale   Expected Outcomes  Short Term:  Able to use RPE daily in rehab to express subjective intensity level;Long Term:  Able to use RPE to guide intensity level when exercising independently   Knowledge and understanding of Target Heart Rate Range (THRR)  Yes   Intervention  Provide education and explanation of THRR including how the numbers were predicted and where they are located for reference   Expected Outcomes  Short Term: Able to state/look up THRR;Long Term: Able to use THRR to govern intensity when exercising independently;Short Term: Able to use daily as guideline for intensity in rehab   Able to check pulse independently  Yes   Intervention  Provide education and demonstration on how to check pulse in carotid and radial arteries.;Review the importance of being able to check your own pulse for safety during independent exercise   Expected Outcomes  Short Term: Able to explain why pulse checking is important during independent exercise;Long Term: Able to check pulse independently and accurately   Understanding of Exercise  Prescription  Yes   Intervention  Provide education, explanation, and written materials on patient's individual exercise prescription   Expected Outcomes  Short Term: Able to explain program exercise prescription;Long Term: Able to explain home exercise prescription to exercise independently          Exercise Goals Re-Evaluation: Exercise Goals Re-Evaluation    Exercise Goal Re-Evaluation    Row Name 04/15/18 1423 04/24/18 1410 05/01/18 1610 05/16/18 0800 06/05/18 1453   Exercise Goals Review  Able to understand and use rate of perceived exertion (RPE) scale;Increase Physical Activity  Able to understand and use rate of perceived exertion (RPE) scale;Increase Physical Activity;Understanding of Exercise Prescription;Knowledge and understanding of Target Heart Rate Range (THRR)  Increase Physical Activity;Increase Strength and Stamina;Able to check pulse independently;Understanding of Exercise  Prescription;Knowledge and understanding of Target Heart Rate Range (THRR);Able to understand and use rate of perceived exertion (RPE) scale  Increase Physical Activity;Increase Strength and Stamina;Able to check pulse independently;Understanding of Exercise Prescription;Knowledge and understanding of Target Heart Rate Range (THRR);Able to understand and use rate of perceived exertion (RPE) scale  Increase Physical Activity;Increase Strength and Stamina;Able to check pulse independently;Understanding of Exercise Prescription;Knowledge and understanding of Target Heart Rate Range (THRR);Able to understand and use rate of perceived exertion (RPE) scale   Comments  Patient able to understand and use RPE scale appropriately.  Reviewed home exercise guidelines including THRR, RPE scale, and endpoints for exercise. Discussed attending pulse counting class with patient. Patient is walking 1-2 miles daily as her mode of home exercise.  Patient will continue to walking 1 to 2 miles in addition to CR exercsise.   Pt MET level has increased to 3.52. Pt is toleratring exercise prescription well and exercising at home in addition to Cardiac Rehab.   Reviewed METs and goals with Pt. Pt has a MET level of 3.51. Will continue exercising at a local gym by taking exercise classes.    Expected Outcomes  Increase workloads as tolerated to help improve cardiorespiratory fitness.  Patient will continue daily walking in addition to exercise at cardiac rehab to help achieve personal health and fitness goals.  Will continue to monitor and progress as tolerated.   Will continue to monitor and progress Pt as tolerated.   Will continue to monitor and progress Pt as tolerated.        Exercise Goal Re-Evaluation    Row Name 06/26/18 1024 07/15/18 1452   Exercise Goals Review  Increase Physical Activity;Increase Strength and Stamina;Able to check pulse independently;Knowledge and understanding of Target Heart Rate Range  (THRR);Understanding of Exercise Prescription;Able to understand and use rate of perceived exertion (RPE) scale  Increase Physical Activity;Increase Strength and Stamina;Able to understand and use rate of perceived exertion (RPE) scale;Knowledge and understanding of Target Heart Rate Range (THRR);Understanding of Exercise Prescription;Able to check pulse independently   Comments  Reviewed METs and goals with Pt. Pt has a MET level of 3.5 and has not increased from last check. Recommended Pt to increase MET level with each session to increase average. Will continue exercising at a local gym by taking exercise classes.   Pt graduated cardiac rehab today. Achieved a 3.6 MET level and progressed through the program well. Plans to continue exercising at a local gym by taking group fitness classes.    Expected Outcomes  Will continue to monitor and progress Pt as tolerated.   Will continue exercise at local gym.           Nutrition & Weight - Outcomes:  Pre Biometrics - 04/09/18 1350    Pre Biometrics          Height  4' 10.75" (1.492 m)    Weight  65 kg    Waist Circumference  32 inches    Hip Circumference  41.75 inches    Waist to Hip Ratio  0.77 %    BMI (Calculated)  29.2    Triceps Skinfold  31 mm    % Body Fat  40 %    Grip Strength  29.5 kg    Flexibility  18 in    Single Leg Stand  30 seconds          Post Biometrics - 06/10/18 1450     Post  Biometrics          Height  4' 10.75" (1.492 m)    Weight  65.9 kg    Waist Circumference  32 inches    Hip Circumference  42 inches    Waist to Hip Ratio  0.76 %    BMI (Calculated)  29.6    Triceps Skinfold  32 mm    % Body Fat  40.4 %    Grip Strength  30 kg    Flexibility  17.5 in    Single Leg Stand  2.75 seconds           Nutrition: Nutrition Therapy & Goals - 07/19/18 0957    Nutrition Therapy          Diet  heart healthy, carb modified        Personal Nutrition Goals          Nutrition Goal  Pt to identify and  limit food sources of saturated fat, trans fat, sodium, refined carbohydrates        Intervention Plan          Intervention  Prescribe, educate and counsel regarding individualized specific dietary modifications aiming towards targeted core components such as weight, hypertension, lipid management, diabetes, heart failure and other comorbidities.    Expected Outcomes  Short Term Goal: Understand basic principles of dietary content, such as calories, fat, sodium, cholesterol and nutrients.;Long Term Goal: Adherence to prescribed nutrition plan.           Nutrition Discharge: Nutrition Assessments - 07/19/18 1001    MEDFICTS Scores          Pre Score  6    Post Score  --   pt did not return medficts post survey          Education Questionnaire Score: Knowledge Questionnaire Score - 07/15/18 1427    Knowledge Questionnaire Score          Post Score  22/24           Goals reviewed with patient; copy given to patient.

## 2018-07-31 ENCOUNTER — Other Ambulatory Visit: Payer: Self-pay | Admitting: Internal Medicine

## 2018-07-31 DIAGNOSIS — J45991 Cough variant asthma: Secondary | ICD-10-CM

## 2018-08-03 ENCOUNTER — Other Ambulatory Visit: Payer: Self-pay | Admitting: Internal Medicine

## 2018-08-03 DIAGNOSIS — J45991 Cough variant asthma: Secondary | ICD-10-CM

## 2018-08-09 ENCOUNTER — Other Ambulatory Visit (HOSPITAL_COMMUNITY): Payer: 59

## 2018-08-09 ENCOUNTER — Encounter (HOSPITAL_COMMUNITY): Payer: 59 | Admitting: Internal Medicine

## 2018-08-22 ENCOUNTER — Encounter (HOSPITAL_COMMUNITY): Payer: Self-pay | Admitting: Internal Medicine

## 2018-08-22 ENCOUNTER — Ambulatory Visit (HOSPITAL_COMMUNITY)
Admission: RE | Admit: 2018-08-22 | Discharge: 2018-08-22 | Disposition: A | Discharge status: Home / Self Care | Payer: 59 | Source: Ambulatory Visit | Attending: Internal Medicine | Admitting: Internal Medicine

## 2018-08-22 ENCOUNTER — Ambulatory Visit (HOSPITAL_BASED_OUTPATIENT_CLINIC_OR_DEPARTMENT_OTHER)
Admission: RE | Admit: 2018-08-22 | Discharge: 2018-08-22 | Disposition: A | Discharge status: Home / Self Care | Payer: 59 | Source: Ambulatory Visit | Attending: Internal Medicine | Admitting: Internal Medicine

## 2018-08-22 VITALS — BP 112/78 | HR 71 | Wt 144.0 lb

## 2018-08-22 DIAGNOSIS — Z79899 Other long term (current) drug therapy: Secondary | ICD-10-CM | POA: Diagnosis not present

## 2018-08-22 DIAGNOSIS — I110 Hypertensive heart disease with heart failure: Secondary | ICD-10-CM | POA: Diagnosis not present

## 2018-08-22 DIAGNOSIS — I428 Other cardiomyopathies: Secondary | ICD-10-CM | POA: Diagnosis not present

## 2018-08-22 DIAGNOSIS — E119 Type 2 diabetes mellitus without complications: Secondary | ICD-10-CM | POA: Insufficient documentation

## 2018-08-22 DIAGNOSIS — I5032 Chronic diastolic (congestive) heart failure: Secondary | ICD-10-CM

## 2018-08-22 DIAGNOSIS — K219 Gastro-esophageal reflux disease without esophagitis: Secondary | ICD-10-CM | POA: Insufficient documentation

## 2018-08-22 DIAGNOSIS — I5022 Chronic systolic (congestive) heart failure: Secondary | ICD-10-CM | POA: Diagnosis present

## 2018-08-22 DIAGNOSIS — I341 Nonrheumatic mitral (valve) prolapse: Secondary | ICD-10-CM | POA: Insufficient documentation

## 2018-08-22 DIAGNOSIS — E785 Hyperlipidemia, unspecified: Secondary | ICD-10-CM | POA: Diagnosis not present

## 2018-08-22 DIAGNOSIS — R0683 Snoring: Secondary | ICD-10-CM | POA: Insufficient documentation

## 2018-08-22 DIAGNOSIS — I4510 Unspecified right bundle-branch block: Secondary | ICD-10-CM | POA: Diagnosis not present

## 2018-08-22 DIAGNOSIS — Z87891 Personal history of nicotine dependence: Secondary | ICD-10-CM | POA: Diagnosis not present

## 2018-08-22 DIAGNOSIS — I493 Ventricular premature depolarization: Secondary | ICD-10-CM

## 2018-08-22 DIAGNOSIS — F419 Anxiety disorder, unspecified: Secondary | ICD-10-CM | POA: Diagnosis not present

## 2018-08-22 LAB — COMPREHENSIVE METABOLIC PANEL
ALT: 81 U/L — ABNORMAL HIGH (ref 0–44)
AST: 63 U/L — ABNORMAL HIGH (ref 15–41)
Albumin: 3.9 g/dL (ref 3.5–5.0)
Alkaline Phosphatase: 60 U/L (ref 38–126)
Anion gap: 14 (ref 5–15)
BUN: 21 mg/dL (ref 8–23)
CO2: 24 mmol/L (ref 22–32)
Calcium: 9.3 mg/dL (ref 8.9–10.3)
Chloride: 99 mmol/L (ref 98–111)
Creatinine, Ser: 1.38 mg/dL — ABNORMAL HIGH (ref 0.44–1.00)
GFR calc Af Amer: 46 mL/min — ABNORMAL LOW (ref >60)
GFR calc non Af Amer: 40 mL/min — ABNORMAL LOW (ref >60)
Glucose, Bld: 102 mg/dL — ABNORMAL HIGH (ref 70–99)
Potassium: 4.4 mmol/L (ref 3.5–5.1)
Sodium: 137 mmol/L (ref 135–145)
Total Bilirubin: 0.8 mg/dL (ref 0.3–1.2)
Total Protein: 6.9 g/dL (ref 6.5–8.1)

## 2018-08-22 LAB — CBC
HCT: 42.8 % (ref 36.0–46.0)
Hemoglobin: 13.5 g/dL (ref 12.0–15.0)
MCH: 29.1 pg (ref 26.0–34.0)
MCHC: 31.5 g/dL (ref 30.0–36.0)
MCV: 92.2 fL (ref 80.0–100.0)
Platelets: 159 10*3/uL (ref 150–400)
RBC: 4.64 MIL/uL (ref 3.87–5.11)
RDW: 13.4 % (ref 11.5–15.5)
WBC: 3.2 10*3/uL — ABNORMAL LOW (ref 4.0–10.5)
nRBC: 0 % (ref 0.0–0.2)

## 2018-08-22 LAB — TSH: TSH: 1.453 u[IU]/mL (ref 0.350–4.500)

## 2018-08-22 LAB — T4, FREE: Free T4: 1.44 ng/dL (ref 0.82–1.77)

## 2018-08-22 MED ORDER — LOSARTAN POTASSIUM 25 MG PO TABS: 25.0000 mg | ORAL_TABLET | Freq: Two times a day (BID) | ORAL | 6 refills | Status: DC

## 2018-08-22 NOTE — Patient Instructions (Signed)
Labs done today. We will contact you with any abnormal lab work.  Your physician has recommended that you have a cardiopulmonary stress test (CPX). CPX testing is a non-invasive measurement of heart and lung function. It replaces a traditional treadmill stress test. This type of test provides a tremendous amount of information that relates not only to your present condition but also for future outcomes. This test combines measurements of you ventilation, respiratory gas exchange in the lungs, electrocardiogram (EKG), blood pressure and physical response before, during, and following an exercise protocol.  You have been referred to [facility] Cardiology: Electrophysiology. This is located at [address] office. They will contact you in order to schedule this appointment.  INCREASE Losartan 25mg  (1 tab) twice daily  Follow up with Dr. [name] in 2-3 months

## 2018-08-22 NOTE — Progress Notes (Signed)
Advanced Heart Failure Clinic Note   Referring Physician: PCP: [name], NP (Inactive) PCP-Cardiologist: [name], MD   HPI: Savannah Bell is a 68 y.o. female with chronic systolic HF due to NICM and history of  frequent PVCs.   Admitted 5/28-12/16/17 with acute systolic HF. Echo showed EF 15%. R/LHC showed normal coronaries and normal LV pressures. cMRI showed EF 17% with no LGE. HF team consulted. She was noted to have frequent PVC's on tele. Loaded with IV amiodarone, then transitioned to PO. She was diuresed with IV lasix, then transitioned to lasix 20 mg daily. HF medications optimized. DC weight: 145 lbs.   She presents today for regular follow up. Doing well overall. She stays active throughout the week with the [employer] program, Zumba classes and yoga. She denies SOB, orthopnea, lightheadedness, dizziness, or chest pain. Chronic dry cough associated with GERD that she is being treated for. She is taking all medications as instructed. She has been referred for sleep study for possible apneic episodes and is agreeable to have it done, but says "they haven't called."   Echo 08/22/2018: EF 20%, personally reviewed by Dr. [name].   Echo 03/27/18 EF 20-25% but LV less dilated   Studies:  Echo 5/29: EF 15% with grade 1 DD, no LV thrombus, mild MR, trivial TR, PA peak pressure 31 mmHg, and trivial pericardial effusion.   R/LHC 5/29: that showed normal coronary arteries, EF 10-20%, normal LV filling pressures (no right sided pressures recorded).   Cardiac MRI 12/13/17: EF 17%, findings consistent with NICM with no evidence ofLGE to suggestinflammatory or infiltrative cardiomyopathy.   Review of systems complete and found to be negative unless listed in HPI.   Past Medical History:  Diagnosis Date  . History of blood transfusion    "when I had colon resection" (12/11/2017)  . Migraine    "stopped in the 1970s" (12/11/2017)  . Mitral valve prolapse      Current Outpatient Medications  Medication Sig Dispense Refill  . amiodarone (PACERONE) 200 MG tablet TAKE 1 TABLET BY MOUTH EVERY DAY 90 tablet 1  . atorvastatin (LIPITOR) 40 MG tablet TAKE 1 TABLET BY MOUTH EVERY DAY AT 6PM 90 tablet 3  . CALCIUM-VITAMIN D PO Take by mouth.    . carvedilol (COREG) 3.125 MG tablet Take 3.125 mg by mouth 2 (two) times daily with a meal.    . famotidine (PEPCID) 20 MG tablet TAKE 1 TABLET BY MOUTH EVERYDAY AT BEDTIME 90 tablet 0  . furosemide (LASIX) 20 MG tablet TAKE 1 TABLET BY MOUTH EVERY DAY 30 tablet 5  . losartan (COZAAR) 25 MG tablet Take 1 tablet (25 mg total) by mouth 2 (two) times daily. 30 tablet 6  . pantoprazole (PROTONIX) 40 MG tablet TAKE 1 TABLET BY MOUTH EVERY DAY 3O TO 60 MINS BEFORE FIRST MEAL OF THE DAY 30 tablet 2  . spironolactone (ALDACTONE) 25 MG tablet Take 1 tablet (25 mg total) by mouth daily. 30 tablet 6  . benzonatate (TESSALON) 200 MG capsule Take 1 capsule (200 mg total) by mouth 3 (three) times daily as needed for cough. (Patient not taking: Reported on 08/22/2018) 45 capsule 2  . chlorpheniramine (CHLOR-TRIMETON) 4 MG tablet Take 4 mg by mouth every 4 (four) hours as needed for allergies.     No current facility-administered medications for this encounter.    No Known Allergies  Social History   Socioeconomic History  . Marital status: Divorced    Spouse name: Not  on file  . Number of children: Not on file  . Years of education: Not on file  . Highest education level: Not on file  Occupational History  . Not on file  Social Needs  . Financial resource strain: Not on file  . Food insecurity:    Worry: Not on file    Inability: Not on file  . Transportation needs:    Medical: Not on file    Non-medical: Not on file  Tobacco Use  . Smoking status: Former Smoker    Packs/day: 0.50    Years: 10.00    Pack years: 5.00    Types: Cigarettes    Last attempt to quit: 1983    Years since quitting: 37.1  . Smokeless  tobacco: Former Neurosurgeon    Types: Snuff, Chew    Quit date: 2000  Substance and Sexual Activity  . Alcohol use: Not Currently  . Drug use: Never  . Sexual activity: Not Currently  Lifestyle  . Physical activity:    Days per week: Not on file    Minutes per session: Not on file  . Stress: Not on file  Relationships  . Social connections:    Talks on phone: Not on file    Gets together: Not on file    Attends religious service: Not on file    Active member of club or organization: Not on file    Attends meetings of clubs or organizations: Not on file    Relationship status: Not on file  . Intimate partner violence:    Fear of current or ex partner: Not on file    Emotionally abused: Not on file    Physically abused: Not on file    Forced sexual activity: Not on file  Other Topics Concern  . Not on file  Social History Narrative  . Not on file    Family History  Problem Relation Age of Onset  . Sudden Cardiac Death Mother    Vitals:   09/13/18 1201  BP: 112/78  Pulse: 71  SpO2: 97%  Weight: 65.3 kg   Wt Readings from Last 3 Encounters:  09-13-2018 65.3 kg  06/25/18 66.2 kg  06/11/18 66.2 kg   PHYSICAL EXAM: General:  Well appearing adult female in NAD. HEENT: normal Neck: supple. No JVD. Carotids 2+ bilat; no bruits. No thyromegaly on exam Cor: RRR. No M/G/R Lungs: CTA Abdomen: soft, nontender, nondistended. No hepatosplenomegaly. No bruits or masses. BS present Extremities: No LEE. No cyanosis or clubbing Neuro: Alert & oriented x 3, cranial nerves grossly intact. Moves all 4 extremities w/o difficulty.  Psych: Mood appropriate, affect pleasant  ASSESSMENT & PLAN:  1. Chronic systolic HF due to NICM. Normal coronaries on [facility] 12/11/17. cMRI shows no infiltrative or inflammatory CM.No family hx of HF. No ETOH. No hx of HTN. No recent virus. She had frequent PVCs (~20%) while inpatient, which may be causing her HF. She does not snore but thinks she goes  apneic. -Echo 12/12/17 EF 15% with grade 1 DD, no LV thrombus, mild MR, trivial TR, PA peak pressure 31 mmHg, and trivial pericardial effusion.  - Echo 03/27/18 EF remains ~ 20-25% but LV less dilated. Personally reviewed by Dr. [name]  - Echo Sep 13, 2018 EF remains low ~20% personally reviewed by Dr. [name].  - NYHA I-II symptoms. Will order CPX to further assess. - Volume status stable on exam   - Continue lasix 20 mg daily. CMET today.  - Continue spiro to 25  mg daily.  - Increase losartan 25 mg BID. May cut back to once daily if pt becomes lightheaded or dizzy. - Continue coreg 3.125 mg BID. Intolerant to up-titration due to palpitations.  - Continue to exercise  - EF remains low ~20% per Dr. [name]. Refer to EP for ICD work-up.   2. Frequent PVCs -~20%burdenon tele during previous admit - Zio Patch 03/27/18 with sinus rhythm with RBBB, Rare PVCs, No high-grade arrythmias or pauses. Pt triggered events all NSR.  -Most likely cause of her NICM - Continue amiodarone 200 mg daily. CMET, TSH, Free T4 today. - Reiterated importance of annual eye exams with Amiodarone - No PVCs appreciated on exam.   3. Hyperlipidemia  - Continue statin therapy.  4. Snoring - She has been referred twice for sleep study. States never heard back from them for scheduling. - Reiterated importance of sleep study to evaluate for sleep apnea. Pt is agreeable.   5. Anxiety - Per PCP.   CMET, CBC, TSH, Free T4 today. Ordered CPX. Referral to EP for ICD work-up. Increased Losartan to 25mg  BID. Will have patient follow up in clinic in 2-3 months, or sooner if symptoms worsen.   Celedonio Miyamoto, Student-PA 08/22/18   Patient seen and examined with the above-signed Advanced Practice Provider and/or Housestaff. I personally reviewed laboratory data, imaging studies and relevant notes. I independently examined the patient and formulated the important aspects of the plan. I have edited the note to  reflect any of my changes or salient points. I have personally discussed the plan with the patient and/or family.  Overall doing well. NYHA II. Volume status looks good. 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 Etiology of CM remains unclear. Will need CPX testing for further risk stratification and to better understand timing of possible need for advanced therapies. Will refer to EP for ICD. Will increase losartan to day and see how BP tolerates. May be able to switch to [facility] eventually. Refer for PSG to evaluate for possible OSA.   [name], MD  10:20 PM

## 2018-08-22 NOTE — Progress Notes (Signed)
  Echocardiogram 2D Echocardiogram has been performed.  Savannah Bell 08/22/2018, 12:29 PM

## 2018-08-26 ENCOUNTER — Ambulatory Visit (HOSPITAL_COMMUNITY): Payer: 59 | Attending: Cardiology

## 2018-08-26 DIAGNOSIS — I5032 Chronic diastolic (congestive) heart failure: Secondary | ICD-10-CM

## 2018-09-04 ENCOUNTER — Other Ambulatory Visit (HOSPITAL_COMMUNITY): Payer: Self-pay | Admitting: Physician Assistant

## 2018-09-04 NOTE — Telephone Encounter (Signed)
This is a CHF pt 

## 2018-09-10 ENCOUNTER — Ambulatory Visit: Payer: 59 | Admitting: Internal Medicine

## 2018-09-10 ENCOUNTER — Encounter: Payer: Self-pay | Admitting: Internal Medicine

## 2018-09-10 VITALS — BP 100/68 | HR 66 | Ht 59.0 in | Wt 143.4 lb

## 2018-09-10 DIAGNOSIS — I493 Ventricular premature depolarization: Secondary | ICD-10-CM | POA: Diagnosis not present

## 2018-09-10 DIAGNOSIS — I5022 Chronic systolic (congestive) heart failure: Secondary | ICD-10-CM

## 2018-09-10 NOTE — Patient Instructions (Addendum)
Medication Instructions:  Your physician recommends that you continue on your current medications as directed. Please refer to the Current Medication list given to you today.  Labwork: None ordered.  Testing/Procedures: Your physician has recommended that you have a defibrillator inserted. An implantable cardioverter defibrillator (ICD) is a small device that is placed in your chest or, in rare cases, your abdomen. This device uses electrical pulses or shocks to help control life-threatening, irregular heartbeats that could lead the heart to suddenly stop beating (sudden cardiac arrest). Leads are attached to the ICD that goes into your heart. This is done in the hospital and usually requires an overnight stay. Please see the instruction sheet given to you today for more information.   The following days are available for procedures:  March 2, 6, 16, 20, 23, 25, 27, 30 April 3, 6, 9, 15, 17, 20, 23, 27, 29  If you decide on a day please give me a call:  Dierdre Highman [phone]    Cardioverter Defibrillator Implantation  An implantable cardioverter defibrillator (ICD) is a small device that is placed under the skin in the chest or abdomen. An ICD consists of a battery, a small computer (pulse generator), and wires (leads) that go into the heart. An ICD is used to detect and correct two types of dangerous irregular heartbeats (arrhythmias):  A rapid heart rhythm (tachycardia).  An arrhythmia in which the lower chambers of the heart (ventricles) contract in an uncoordinated way (fibrillation). When an ICD detects tachycardia, it sends a low-energy shock to the heart to restore the heartbeat to normal (cardioversion). This signal is usually painless. If cardioversion does not work or if the ICD detects fibrillation, it delivers a high-energy shock to the heart (defibrillation) to restart the heart. This shock may feel like a strong jolt in the chest. Your health care provider may prescribe an  ICD if:  You have had an arrhythmia that originated in the ventricles.  Your heart has been damaged by a disease or heart condition. Sometimes, ICDs are programmed to act as a device called a pacemaker. Pacemakers can be used to treat a slow heartbeat (bradycardia) or tachycardia by taking over the heart rate with electrical impulses. Tell a health care provider about:  Any allergies you have.  All medicines you are taking, including vitamins, herbs, eye drops, creams, and over-the-counter medicines.  Any problems you or family members have had with anesthetic medicines.  Any blood disorders you have.  Any surgeries you have had.  Any medical conditions you have.  Whether you are pregnant or may be pregnant. What are the risks? Generally, this is a safe procedure. However, problems may occur, including:  Swelling, bleeding, or bruising.  Infection.  Blood clots.  Damage to other structures or organs, such as nerves, blood vessels, or the heart.  Allergic reactions to medicines used during the procedure. What happens before the procedure? Staying hydrated Follow instructions from your health care provider about hydration, which may include:  Up to 2 hours before the procedure - you may continue to drink clear liquids, such as water, clear fruit juice, black coffee, and plain tea. Eating and drinking restrictions Follow instructions from your health care provider about eating and drinking, which may include:  8 hours before the procedure - stop eating heavy meals or foods such as meat, fried foods, or fatty foods.  6 hours before the procedure - stop eating light meals or foods, such as toast or cereal.  6 hours before  the procedure - stop drinking milk or drinks that contain milk.  2 hours before the procedure - stop drinking clear liquids. Medicine Ask your health care provider about:  Changing or stopping your normal medicines. This is important if you take  diabetes medicines or blood thinners.  Taking medicines such as aspirin and ibuprofen. These medicines can thin your blood. Do not take these medicines before your procedure if your doctor tells you not to. Tests  You may have blood tests.  You may have a test to check the electrical signals in your heart (electrocardiogram, ECG).  You may have imaging tests, such as a chest X-ray. General instructions  For 24 hours before the procedure, stop using products that contain nicotine or tobacco, such as cigarettes and e-cigarettes. If you need help quitting, ask your health care provider.  Plan to have someone take you home from the hospital or clinic.  You may be asked to shower with a germ-killing soap. What happens during the procedure?  To reduce your risk of infection: ? Your health care team will wash or sanitize their hands. ? Your skin will be washed with soap. ? Hair may be removed from the surgical area.  Small monitors will be put on your body. They will be used to check your heart, blood pressure, and oxygen level.  An IV tube will be inserted into one of your veins.  You will be given one or more of the following: ? A medicine to help you relax (sedative). ? A medicine to numb the area (local anesthetic). ? A medicine to make you fall asleep (general anesthetic).  Leads will be guided through a blood vessel into your heart and attached to your heart muscles. Depending on the ICD, the leads may go into one ventricle or they may go into both ventricles and into an upper chamber of the heart. An X-ray machine (fluoroscope) will be usedto help guide the leads.  A small incision will be made to create a deep pocket under your skin.  The pulse generator will be placed into the pocket.  The ICD will be tested.  The incision will be closed with stitches (sutures), skin glue, or staples.  A bandage (dressing) will be placed over the incision. This procedure may vary among  health care providers and hospitals. What happens after the procedure?  Your blood pressure, heart rate, breathing rate, and blood oxygen level will be monitored often until the medicines you were given have worn off.  A chest X-ray will be taken to check that the ICD is in the right place.  You will need to stay in the hospital for 1-2 days so your health care provider can make sure your ICD is working.  Do not drive for 24 hours if you received a sedative. Ask your health care provider when it is safe for you to drive.  You may be given an identification card explaining that you have an ICD. Summary  An implantable cardioverter defibrillator (ICD) is a small device that is placed under the skin in the chest or abdomen. It is used to detect and correct dangerous irregular heartbeats (arrhythmias).  An ICD consists of a battery, a small computer (pulse generator), and wires (leads) that go into the heart.  When an ICD detects rapid heart rhythm (tachycardia), it sends a low-energy shock to the heart to restore the heartbeat to normal (cardioversion). If cardioversion does not work or if the ICD detects uncoordinated heart contractions (fibrillation),  it delivers a high-energy shock to the heart (defibrillation) to restart the heart.  You will need to stay in the hospital for 1-2 days to make sure your ICD is working. This information is not intended to replace advice given to you by your health care provider. Make sure you discuss any questions you have with your health care provider. Document Released: 03/25/2002 Document Revised: 07/12/2016 Document Reviewed: 07/12/2016 Elsevier Interactive Patient Education  2019 [employer].

## 2018-09-10 NOTE — Progress Notes (Signed)
HPI Savannah Bell is referred today by Dr. Dorthea Cove for consideration for ICD insertion. 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 She does not have CAD. She initially had an EF of 20% and has been placed on medical therapy. She has not had an improvement in her EF. She has class 2 symptoms. She denies syncope. She has LBBB. She was placed on amiodarone for PVC's which have resolved but unfortunately her EF has not improved.  No Known Allergies   Current Outpatient Medications  Medication Sig Dispense Refill  . amiodarone (PACERONE) 200 MG tablet TAKE 1 TABLET BY MOUTH EVERY DAY 90 tablet 1  . atorvastatin (LIPITOR) 40 MG tablet TAKE 1 TABLET BY MOUTH EVERY DAY AT 6PM 90 tablet 3  . benzonatate (TESSALON) 200 MG capsule Take 1 capsule (200 mg total) by mouth 3 (three) times daily as needed for cough. 45 capsule 2  . CALCIUM-VITAMIN D PO Take by mouth.    . carvedilol (COREG) 3.125 MG tablet Take 3.125 mg by mouth 2 (two) times daily with a meal.    . chlorpheniramine (CHLOR-TRIMETON) 4 MG tablet Take 4 mg by mouth every 4 (four) hours as needed for allergies.    . famotidine (PEPCID) 20 MG tablet TAKE 1 TABLET BY MOUTH EVERYDAY AT BEDTIME 90 tablet 0  . furosemide (LASIX) 20 MG tablet TAKE 1 TABLET BY MOUTH EVERY DAY 30 tablet 5  . losartan (COZAAR) 25 MG tablet Take 1 tablet (25 mg total) by mouth 2 (two) times daily. 30 tablet 6  . pantoprazole (PROTONIX) 40 MG tablet TAKE 1 TABLET BY MOUTH EVERY DAY 3O TO 60 MINS BEFORE FIRST MEAL OF THE DAY 30 tablet 2  . spironolactone (ALDACTONE) 25 MG tablet Take 1 tablet (25 mg total) by mouth daily. 30 tablet 6   No current facility-administered medications for this visit.      Past Medical History:  Diagnosis Date  . History of blood transfusion    "when I had colon resection" (12/11/2017)  . Migraine    "stopped in the 1970s" (12/11/2017)  . Mitral valve prolapse     ROS:   All systems reviewed and  negative except as noted in the HPI.   Past Surgical History:  Procedure Laterality Date  . ABDOMINAL HYSTERECTOMY  1998  . APPENDECTOMY  2003  . APPLICATION OF WOUND VAC  09/05/2011   Procedure: APPLICATION OF WOUND VAC;  Surgeon: Emelia Loron, MD;  Location: [facility];  Service: General;  Laterality: N/A;  . BOWEL RESECTION  09/05/2011   Procedure: SMALL BOWEL RESECTION;  Surgeon: Emelia Loron, MD;  Location: [facility];  Service: General;  Laterality: N/A;  . BUNIONECTOMY WITH HAMMERTOE RECONSTRUCTION Bilateral    "& shortened toes"  . CESAREAN SECTION  1972  . COLON SURGERY    . LAPAROTOMY  09/05/2011   Procedure: EXPLORATORY LAPAROTOMY;  Surgeon: Emelia Loron, MD;  Location: [facility];  Service: General;  Laterality: N/A;  . LEFT HEART CATH AND CORONARY ANGIOGRAPHY N/A 12/12/2017   Procedure: LEFT HEART CATH AND CORONARY ANGIOGRAPHY;  Surgeon: Lyn Records, MD;  Location: [facility];  Service: Cardiovascular;  Laterality: N/A;  . TUBAL LIGATION  1974     Family History  Problem Relation Age of Onset  . Sudden Cardiac Death Mother      Social History   Socioeconomic History  . Marital status: Divorced  Spouse name: Not on file  . Number of children: Not on file  . Years of education: Not on file  . Highest education level: Not on file  Occupational History  . Not on file  Social Needs  . Financial resource strain: Not on file  . Food insecurity:    Worry: Not on file    Inability: Not on file  . Transportation needs:    Medical: Not on file    Non-medical: Not on file  Tobacco Use  . Smoking status: Former Smoker    Packs/day: 0.50    Years: 10.00    Pack years: 5.00    Types: Cigarettes    Last attempt to quit: 1983    Years since quitting: 37.1  . Smokeless tobacco: Former User    Types: Snuff, Chew    Quit date: 2000  Substance and Sexual Activity  . Alcohol use: Not Currently  . Drug use: Never  . Sexual activity: Not Currently   Lifestyle  . Physical activity:    Days per week: Not on file    Minutes per session: Not on file  . Stress: Not on file  Relationships  . Social connections:    Talks on phone: Not on file    Gets together: Not on file    Attends religious service: Not on file    Active member of club or organization: Not on file    Attends meetings of clubs or organizations: Not on file    Relationship status: Not on file  . Intimate partner violence:    Fear of current or ex partner: Not on file    Emotionally abused: Not on file    Physically abused: Not on file    Forced sexual activity: Not on file  Other Topics Concern  . Not on file  Social History Narrative  . Not on file     BP 100/68   Pulse 66   Ht 4' 11" (1.499 m)   Wt 143 lb 6.4 oz (65 kg)   SpO2 99%   BMI 28.96 kg/m   Physical Exam:  Well appearing NAD HEENT: Unremarkable Neck:  No JVD, no thyromegally Lymphatics:  No adenopathy Back:  No CVA tenderness Lungs:  Clear with no wheezes HEART:  Regular rate rhythm, no murmurs, no rubs, no clicks Abd:  soft, positive bowel sounds, no organomegally, no rebound, no guarding Ext:  2 plus pulses, no edema, no cyanosis, no clubbing Skin:  No rashes no nodules Neuro:  CN II through XII intact, motor grossly intact  EKG - nsr with LBBB  DEVICE  Normal device function.  See PaceArt for details.   Assess/Plan: 1. Chronic systolic heart failure - her EF is 20% despite maximal medical therapy. I have recommended ICD insertion. With her LBBB, and QRS duration of 174 ms, I would recommend a biv device. She is considering her options and will call us if she wishes to proceed. 2. PVC's - these have resolved on low dose amiodarone. 3. Dyslipidemia - she will continue her statin therapy.   , M.D. 

## 2018-09-10 NOTE — H&P (View-Only) (Signed)
HPI Savannah Bell is referred today by Dr. Dorthea Cove for consideration for ICD insertion. 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 She does not have CAD. She initially had an EF of 20% and has been placed on medical therapy. She has not had an improvement in her EF. She has class 2 symptoms. She denies syncope. She has LBBB. She was placed on amiodarone for PVC's which have resolved but unfortunately her EF has not improved.  No Known Allergies   Current Outpatient Medications  Medication Sig Dispense Refill  . amiodarone (PACERONE) 200 MG tablet TAKE 1 TABLET BY MOUTH EVERY DAY 90 tablet 1  . atorvastatin (LIPITOR) 40 MG tablet TAKE 1 TABLET BY MOUTH EVERY DAY AT 6PM 90 tablet 3  . benzonatate (TESSALON) 200 MG capsule Take 1 capsule (200 mg total) by mouth 3 (three) times daily as needed for cough. 45 capsule 2  . CALCIUM-VITAMIN D PO Take by mouth.    . carvedilol (COREG) 3.125 MG tablet Take 3.125 mg by mouth 2 (two) times daily with a meal.    . chlorpheniramine (CHLOR-TRIMETON) 4 MG tablet Take 4 mg by mouth every 4 (four) hours as needed for allergies.    . famotidine (PEPCID) 20 MG tablet TAKE 1 TABLET BY MOUTH EVERYDAY AT BEDTIME 90 tablet 0  . furosemide (LASIX) 20 MG tablet TAKE 1 TABLET BY MOUTH EVERY DAY 30 tablet 5  . losartan (COZAAR) 25 MG tablet Take 1 tablet (25 mg total) by mouth 2 (two) times daily. 30 tablet 6  . pantoprazole (PROTONIX) 40 MG tablet TAKE 1 TABLET BY MOUTH EVERY DAY 3O TO 60 MINS BEFORE FIRST MEAL OF THE DAY 30 tablet 2  . spironolactone (ALDACTONE) 25 MG tablet Take 1 tablet (25 mg total) by mouth daily. 30 tablet 6   No current facility-administered medications for this visit.      Past Medical History:  Diagnosis Date  . History of blood transfusion    "when I had colon resection" (12/11/2017)  . Migraine    "stopped in the 1970s" (12/11/2017)  . Mitral valve prolapse     ROS:   All systems reviewed and  negative except as noted in the HPI.   Past Surgical History:  Procedure Laterality Date  . ABDOMINAL HYSTERECTOMY  1998  . APPENDECTOMY  2003  . APPLICATION OF WOUND VAC  09/05/2011   Procedure: APPLICATION OF WOUND VAC;  Surgeon: Emelia Loron, MD;  Location: [facility];  Service: General;  Laterality: N/A;  . BOWEL RESECTION  09/05/2011   Procedure: SMALL BOWEL RESECTION;  Surgeon: Emelia Loron, MD;  Location: [facility];  Service: General;  Laterality: N/A;  . BUNIONECTOMY WITH HAMMERTOE RECONSTRUCTION Bilateral    "& shortened toes"  . CESAREAN SECTION  1972  . COLON SURGERY    . LAPAROTOMY  09/05/2011   Procedure: EXPLORATORY LAPAROTOMY;  Surgeon: Emelia Loron, MD;  Location: [facility];  Service: General;  Laterality: N/A;  . LEFT HEART CATH AND CORONARY ANGIOGRAPHY N/A 12/12/2017   Procedure: LEFT HEART CATH AND CORONARY ANGIOGRAPHY;  Surgeon: Lyn Records, MD;  Location: [facility];  Service: Cardiovascular;  Laterality: N/A;  . TUBAL LIGATION  1974     Family History  Problem Relation Age of Onset  . Sudden Cardiac Death Mother      Social History   Socioeconomic History  . Marital status: Divorced  Spouse name: Not on file  . Number of children: Not on file  . Years of education: Not on file  . Highest education level: Not on file  Occupational History  . Not on file  Social Needs  . Financial resource strain: Not on file  . Food insecurity:    Worry: Not on file    Inability: Not on file  . Transportation needs:    Medical: Not on file    Non-medical: Not on file  Tobacco Use  . Smoking status: Former Smoker    Packs/day: 0.50    Years: 10.00    Pack years: 5.00    Types: Cigarettes    Last attempt to quit: 1983    Years since quitting: 37.1  . Smokeless tobacco: Former Neurosurgeon    Types: Snuff, Chew    Quit date: 2000  Substance and Sexual Activity  . Alcohol use: Not Currently  . Drug use: Never  . Sexual activity: Not Currently   Lifestyle  . Physical activity:    Days per week: Not on file    Minutes per session: Not on file  . Stress: Not on file  Relationships  . Social connections:    Talks on phone: Not on file    Gets together: Not on file    Attends religious service: Not on file    Active member of club or organization: Not on file    Attends meetings of clubs or organizations: Not on file    Relationship status: Not on file  . Intimate partner violence:    Fear of current or ex partner: Not on file    Emotionally abused: Not on file    Physically abused: Not on file    Forced sexual activity: Not on file  Other Topics Concern  . Not on file  Social History Narrative  . Not on file     BP 100/68   Pulse 66   Ht 4\' 11"  (1.499 m)   Wt 143 lb 6.4 oz (65 kg)   SpO2 99%   BMI 28.96 kg/m   Physical Exam:  Well appearing NAD HEENT: Unremarkable Neck:  No JVD, no thyromegally Lymphatics:  No adenopathy Back:  No CVA tenderness Lungs:  Clear with no wheezes HEART:  Regular rate rhythm, no murmurs, no rubs, no clicks Abd:  soft, positive bowel sounds, no organomegally, no rebound, no guarding Ext:  2 plus pulses, no edema, no cyanosis, no clubbing Skin:  No rashes no nodules Neuro:  CN II through XII intact, motor grossly intact  EKG - nsr with LBBB  DEVICE  Normal device function.  See PaceArt for details.   Assess/Plan: 1. Chronic systolic heart failure - her EF is 20% despite maximal medical therapy. I have recommended ICD insertion. With her LBBB, and QRS duration of 174 ms, I would recommend a biv device. She is considering her options and will call us if she wishes to proceed. 2. PVC's - these have resolved on low dose amiodarone. 3. Dyslipidemia - she will continue her statin therapy.  Lewayne Bunting, M.D.

## 2018-09-12 ENCOUNTER — Telehealth: Payer: Self-pay | Admitting: Internal Medicine

## 2018-09-12 NOTE — Telephone Encounter (Signed)
No outreach made to Pt by this nurse.  Unable to determine that anyone from this office called Pt.  Pt instructed to contact this office if she wanted to schedule procedure.  No action needed.

## 2018-09-12 NOTE — Telephone Encounter (Signed)
Pt seen with Dr. Ladona Ridgel 09/10/18. Not sure if Dr. Lubertha Basque nurse called the pt.

## 2018-09-12 NOTE — Telephone Encounter (Signed)
Follow up:   Patient states some one called and she is returning the back. Please call patient.

## 2018-09-13 ENCOUNTER — Ambulatory Visit: Payer: Self-pay | Admitting: Physician Assistant

## 2018-09-13 ENCOUNTER — Telehealth: Payer: Self-pay

## 2018-09-13 ENCOUNTER — Telehealth: Payer: Self-pay | Admitting: Internal Medicine

## 2018-09-13 ENCOUNTER — Other Ambulatory Visit: Payer: 59 | Admitting: *Deleted

## 2018-09-13 DIAGNOSIS — I5022 Chronic systolic (congestive) heart failure: Secondary | ICD-10-CM

## 2018-09-13 NOTE — Telephone Encounter (Signed)
Follow Up: ° ° ° °Returning your call from yesterday. °

## 2018-09-13 NOTE — Telephone Encounter (Signed)
Pt calling to set up ICD.  Pt scheduled for march 6 at 2:00 pm   Pt does not need repeat lab work.  Pt will come to office today to pick up instruction letter and scrub.

## 2018-09-13 NOTE — Telephone Encounter (Signed)
Call back received from Pt.  She needs to reschedule to march 16.  Letter corrected.

## 2018-09-14 LAB — BASIC METABOLIC PANEL
BUN/Creatinine Ratio: 12 (ref 12–28)
BUN: 17 mg/dL (ref 8–27)
CO2: 27 mmol/L (ref 20–29)
Calcium: 9.1 mg/dL (ref 8.7–10.3)
Chloride: 95 mmol/L — ABNORMAL LOW (ref 96–106)
Creatinine, Ser: 1.47 mg/dL — ABNORMAL HIGH (ref 0.57–1.00)
GFR calc Af Amer: 43 mL/min/{1.73_m2} — ABNORMAL LOW (ref >59)
GFR calc non Af Amer: 37 mL/min/{1.73_m2} — ABNORMAL LOW (ref >59)
Glucose: 87 mg/dL (ref 65–99)
Potassium: 4.7 mmol/L (ref 3.5–5.2)
Sodium: 133 mmol/L — ABNORMAL LOW (ref 134–144)

## 2018-09-14 LAB — CBC WITH DIFFERENTIAL/PLATELET
Basophils Absolute: 0 10*3/uL (ref 0.0–0.2)
Basos: 1 %
EOS (ABSOLUTE): 0 10*3/uL (ref 0.0–0.4)
Eos: 1 %
Hematocrit: 37.8 % (ref 34.0–46.6)
Hemoglobin: 12.5 g/dL (ref 11.1–15.9)
Immature Grans (Abs): 0 10*3/uL (ref 0.0–0.1)
Immature Granulocytes: 0 %
Lymphocytes Absolute: 1.5 10*3/uL (ref 0.7–3.1)
Lymphs: 42 %
MCH: 29.7 pg (ref 26.6–33.0)
MCHC: 33.1 g/dL (ref 31.5–35.7)
MCV: 90 fL (ref 79–97)
Monocytes Absolute: 0.6 10*3/uL (ref 0.1–0.9)
Monocytes: 17 %
Neutrophils Absolute: 1.4 10*3/uL (ref 1.4–7.0)
Neutrophils: 39 %
Platelets: 169 10*3/uL (ref 150–450)
RBC: 4.21 x10E6/uL (ref 3.77–5.28)
RDW: 12.8 % (ref 11.7–15.4)
WBC: 3.5 10*3/uL (ref 3.4–10.8)

## 2018-09-27 ENCOUNTER — Other Ambulatory Visit: Payer: Self-pay | Admitting: Nurse Practitioner

## 2018-09-27 DIAGNOSIS — E2839 Other primary ovarian failure: Secondary | ICD-10-CM

## 2018-09-30 ENCOUNTER — Ambulatory Visit (HOSPITAL_COMMUNITY)
Admission: RE | Disposition: A | Discharge status: Home / Self Care | Payer: Self-pay | Source: Home / Self Care | Attending: Internal Medicine

## 2018-09-30 ENCOUNTER — Ambulatory Visit (HOSPITAL_COMMUNITY)
Admission: RE | Admit: 2018-09-30 | Discharge: 2018-10-01 | Disposition: A | Discharge status: Home / Self Care | Payer: 59 | Attending: Internal Medicine | Admitting: Internal Medicine

## 2018-09-30 ENCOUNTER — Other Ambulatory Visit: Payer: Self-pay

## 2018-09-30 DIAGNOSIS — Z79899 Other long term (current) drug therapy: Secondary | ICD-10-CM | POA: Insufficient documentation

## 2018-09-30 DIAGNOSIS — Z90710 Acquired absence of both cervix and uterus: Secondary | ICD-10-CM | POA: Diagnosis not present

## 2018-09-30 DIAGNOSIS — I428 Other cardiomyopathies: Secondary | ICD-10-CM | POA: Diagnosis present

## 2018-09-30 DIAGNOSIS — E785 Hyperlipidemia, unspecified: Secondary | ICD-10-CM | POA: Diagnosis not present

## 2018-09-30 DIAGNOSIS — I442 Atrioventricular block, complete: Secondary | ICD-10-CM | POA: Diagnosis not present

## 2018-09-30 DIAGNOSIS — Z006 Encounter for examination for normal comparison and control in clinical research program: Secondary | ICD-10-CM | POA: Diagnosis not present

## 2018-09-30 DIAGNOSIS — I5023 Acute on chronic systolic (congestive) heart failure: Secondary | ICD-10-CM | POA: Diagnosis not present

## 2018-09-30 DIAGNOSIS — Z87891 Personal history of nicotine dependence: Secondary | ICD-10-CM | POA: Diagnosis not present

## 2018-09-30 DIAGNOSIS — I341 Nonrheumatic mitral (valve) prolapse: Secondary | ICD-10-CM | POA: Diagnosis not present

## 2018-09-30 DIAGNOSIS — Z8249 Family history of ischemic heart disease and other diseases of the circulatory system: Secondary | ICD-10-CM | POA: Insufficient documentation

## 2018-09-30 DIAGNOSIS — I5022 Chronic systolic (congestive) heart failure: Secondary | ICD-10-CM | POA: Diagnosis not present

## 2018-09-30 DIAGNOSIS — I447 Left bundle-branch block, unspecified: Secondary | ICD-10-CM | POA: Diagnosis not present

## 2018-09-30 DIAGNOSIS — Z95810 Presence of automatic (implantable) cardiac defibrillator: Secondary | ICD-10-CM

## 2018-09-30 HISTORY — PX: BIV ICD INSERTION CRT-D: EP1195

## 2018-09-30 LAB — SURGICAL PCR SCREEN
MRSA, PCR: NEGATIVE
Staphylococcus aureus: NEGATIVE

## 2018-09-30 SURGERY — BIV ICD INSERTION CRT-D

## 2018-09-30 MED ORDER — CARVEDILOL 3.125 MG PO TABS
3.1250 mg | ORAL_TABLET | Freq: Two times a day (BID) | ORAL | Status: DC
  Administered 2018-09-30 – 2018-10-01 (×2): 3.125 mg via ORAL
  Filled 2018-09-30 (×2): qty 1

## 2018-09-30 MED ORDER — LOSARTAN POTASSIUM 25 MG PO TABS
25.0000 mg | ORAL_TABLET | Freq: Two times a day (BID) | ORAL | Status: DC
  Administered 2018-10-01: 25 mg via ORAL
  Filled 2018-09-30 (×2): qty 1

## 2018-09-30 MED ORDER — LIDOCAINE HCL (PF) 1 % IJ SOLN
INTRAMUSCULAR | Status: DC | PRN
  Administered 2018-09-30: 60 mL

## 2018-09-30 MED ORDER — SODIUM CHLORIDE 0.9 % IV SOLN
INTRAVENOUS | Status: AC
  Filled 2018-09-30: qty 2

## 2018-09-30 MED ORDER — AMIODARONE HCL 200 MG PO TABS
200.0000 mg | ORAL_TABLET | Freq: Every day | ORAL | Status: DC
  Administered 2018-10-01: 200 mg via ORAL
  Filled 2018-09-30: qty 1

## 2018-09-30 MED ORDER — FUROSEMIDE 20 MG PO TABS
20.0000 mg | ORAL_TABLET | Freq: Every day | ORAL | Status: DC
  Administered 2018-09-30: 20 mg via ORAL
  Filled 2018-09-30: qty 1

## 2018-09-30 MED ORDER — SPIRONOLACTONE 25 MG PO TABS
25.0000 mg | ORAL_TABLET | Freq: Every day | ORAL | Status: DC
  Administered 2018-09-30: 25 mg via ORAL
  Filled 2018-09-30: qty 1

## 2018-09-30 MED ORDER — SODIUM CHLORIDE 0.9 % IV SOLN
INTRAVENOUS | Status: DC
  Administered 2018-09-30: 09:00:00 via INTRAVENOUS

## 2018-09-30 MED ORDER — MUPIROCIN 2 % EX OINT
1.0000 "application " | TOPICAL_OINTMENT | Freq: Once | CUTANEOUS | Status: AC
  Administered 2018-09-30: 1 via TOPICAL
  Filled 2018-09-30: qty 22

## 2018-09-30 MED ORDER — LIDOCAINE HCL (PF) 1 % IJ SOLN
INTRAMUSCULAR | Status: AC
  Filled 2018-09-30: qty 30

## 2018-09-30 MED ORDER — FENTANYL CITRATE (PF) 100 MCG/2ML IJ SOLN
INTRAMUSCULAR | Status: DC | PRN
  Administered 2018-09-30 (×3): 12.5 ug via INTRAVENOUS

## 2018-09-30 MED ORDER — BENZONATATE 100 MG PO CAPS: 200.0000 mg | ORAL_CAPSULE | Freq: Three times a day (TID) | ORAL | Status: DC | PRN

## 2018-09-30 MED ORDER — IOHEXOL 350 MG/ML SOLN
INTRAVENOUS | Status: DC | PRN
  Administered 2018-09-30: 5 mL via INTRAVENOUS
  Administered 2018-09-30: 10 mL via INTRAVENOUS

## 2018-09-30 MED ORDER — ONDANSETRON HCL 4 MG/2ML IJ SOLN: 4.0000 mg | Freq: Four times a day (QID) | INTRAMUSCULAR | Status: DC | PRN

## 2018-09-30 MED ORDER — MIDAZOLAM HCL 5 MG/5ML IJ SOLN
INTRAMUSCULAR | Status: AC
  Filled 2018-09-30: qty 5

## 2018-09-30 MED ORDER — CEFAZOLIN SODIUM-DEXTROSE 1-4 GM/50ML-% IV SOLN
1.0000 g | Freq: Four times a day (QID) | INTRAVENOUS | Status: AC
  Administered 2018-09-30 – 2018-10-01 (×3): 1 g via INTRAVENOUS
  Filled 2018-09-30 (×3): qty 50

## 2018-09-30 MED ORDER — FENTANYL CITRATE (PF) 100 MCG/2ML IJ SOLN
INTRAMUSCULAR | Status: AC
  Filled 2018-09-30: qty 2

## 2018-09-30 MED ORDER — CEFAZOLIN SODIUM-DEXTROSE 2-4 GM/100ML-% IV SOLN
INTRAVENOUS | Status: AC
  Filled 2018-09-30: qty 100

## 2018-09-30 MED ORDER — CEFAZOLIN SODIUM-DEXTROSE 2-4 GM/100ML-% IV SOLN
2.0000 g | INTRAVENOUS | Status: AC
  Administered 2018-09-30: 2 g via INTRAVENOUS

## 2018-09-30 MED ORDER — HEPARIN (PORCINE) IN NACL 1000-0.9 UT/500ML-% IV SOLN
INTRAVENOUS | Status: AC
  Filled 2018-09-30: qty 500

## 2018-09-30 MED ORDER — ACETAMINOPHEN 325 MG PO TABS
325.0000 mg | ORAL_TABLET | ORAL | Status: DC | PRN
  Administered 2018-09-30: 650 mg via ORAL
  Administered 2018-10-01 (×2): 325 mg via ORAL
  Filled 2018-09-30: qty 2
  Filled 2018-09-30: qty 1
  Filled 2018-09-30: qty 2

## 2018-09-30 MED ORDER — HEPARIN (PORCINE) IN NACL 1000-0.9 UT/500ML-% IV SOLN
INTRAVENOUS | Status: DC | PRN
  Administered 2018-09-30: 500 mL

## 2018-09-30 MED ORDER — ATORVASTATIN CALCIUM 40 MG PO TABS: 40.0000 mg | ORAL_TABLET | Freq: Every day | ORAL | Status: DC

## 2018-09-30 MED ORDER — MIDAZOLAM HCL 5 MG/5ML IJ SOLN
INTRAMUSCULAR | Status: DC | PRN
  Administered 2018-09-30 (×3): 1 mg via INTRAVENOUS

## 2018-09-30 MED ORDER — SODIUM CHLORIDE 0.9 % IV SOLN
80.0000 mg | INTRAVENOUS | Status: AC
  Administered 2018-09-30: 80 mg

## 2018-09-30 SURGICAL SUPPLY — 14 items
CABLE SURGICAL S-101-97-12 (CABLE) ×3 IMPLANT
CATH ACUITYPRO 45CM H 9F (CATHETERS) ×1 IMPLANT
CATH HEX JOSEPH 2-5-2 65CM 6F (CATHETERS) ×1 IMPLANT
ICD VIGILANT DF4 G247 (ICD Generator) ×1 IMPLANT
INGEVITY MRI 7740-45CM (Lead) ×2 IMPLANT
LEAD ACUITY X4 4671 (Lead) ×1 IMPLANT
LEAD PACING INGEVITY MRI 45CM (Lead) IMPLANT
LEAD RELIANCE G DF4 0292 (Lead) ×1 IMPLANT
PAD PRO RADIOLUCENT 2001M-C (PAD) ×2 IMPLANT
SHEATH CLASSIC 7F (SHEATH) ×1 IMPLANT
SHEATH CLASSIC 9.5F (SHEATH) ×1 IMPLANT
SHEATH CLASSIC 9F (SHEATH) ×1 IMPLANT
TRAY PACEMAKER INSERTION (PACKS) ×2 IMPLANT
WIRE LUGE 182CM (WIRE) ×1 IMPLANT

## 2018-09-30 NOTE — Interval H&P Note (Signed)
History and Physical Interval Note:  09/30/2018 11:58 AM  Savannah Bell  has presented today for surgery, with the diagnosis of Cardiomyopathy.  The various methods of treatment have been discussed with the patient and family. After consideration of risks, benefits and other options for treatment, the patient has consented to  Procedure(s): BIV ICD INSERTION CRT-D (N/A) as a surgical intervention.  The patient's history has been reviewed, patient examined, no change in status, stable for surgery.  I have reviewed the patient's chart and labs.  Questions were answered to the patient's satisfaction.     Lewayne Bunting

## 2018-09-30 NOTE — Discharge Summary (Addendum)
ELECTROPHYSIOLOGY PROCEDURE DISCHARGE SUMMARY    Patient ID: Savannah Bell,  MRN: [id number], DOB/AGE: December 26, 1950 68 y.o.  Admit date: 09/30/2018 Discharge date: 10/01/2018  Primary Care Physician: Maudie Flakes, FNP  Primary Cardiologist: Dr. [name] Electrophysiologist: Dr. Ladona Ridgel  Primary Discharge Diagnosis:  1. NICM  Secondary Discharge Diagnosis:  1. Chronic CHF (systolic) 2. PVCs 3. HLD  No Known Allergies   Procedures This Admission:  1.  Implantation of a BSCi CRT-D on 09/30/2018 by Dr Ladona Ridgel.   There were no immediate post procedure complications. 2.  CXR on 10/01/2018 demonstrated no pneumothorax status post device implantation.   Brief HPI: Savannah Bell is a 68 y.o. female was referred to electrophysiology in the outpatient setting for consideration of ICD implantation.  Past medical history includes above.  The patient has persistent LV dysfunction despite guideline directed therapy.  Risks, benefits, and alternatives to ICD implantation were reviewed with the patient who wished to proceed.   Hospital Course:  The patient was admitted and underwent implantation of an ICD with details as outlined above. She was monitored on telemetry overnight which demonstrated SR.  Left chest was without hematoma or ecchymosis.  The device was interrogated post procedure and found to be functioning normally.  CXR was obtained POD #1 and demonstrated no pneumothorax status post device implantation.  Wound care, arm mobility, and restrictions were reviewed with the patient.  The patient feels well this morning, no CP or SOB.  Reports minimal site discomfort, she was examined by Dr. Ladona Ridgel and considered stable for discharge to home.   The patient's discharge medications include an ARB (losartan) and beta blocker (carvedilol).   Physical Exam: Vitals:   09/30/18 1801 09/30/18 2123 10/01/18 0607 10/01/18 0650  BP: 102/62 92/63 (!) 96/53   Pulse:  67 71   Resp:  20  15   Temp:  (!) 97.5 F (36.4 C) 97.8 F (36.6 C)   TempSrc:  Oral Oral   SpO2:  98% 99%   Weight:    65.9 kg  Height:        GEN- The patient is well appearing, alert and oriented x 3 today.   HEENT: normocephalic, atraumatic; sclera clear, conjunctiva pink; hearing intact; oropharynx clear Lungs- CTA b/l, normal work of breathing.  No wheezes, rales, rhonchi Heart- RRR, no murmurs, rubs or gallops, PMI not laterally displaced GI- soft, non-tender, non-distended Extremities- no clubbing, cyanosis, or edema MS- no significant deformity or atrophy Skin- warm and dry, no rash or lesion, left chest without hematoma/ecchymosis Psych- euthymic mood, full affect Neuro- no gross defecits  Labs:   Lab Results  Component Value Date   WBC 3.5 09/13/2018   HGB 12.5 09/13/2018   HCT 37.8 09/13/2018   MCV 90 09/13/2018   PLT 169 09/13/2018   No results for input(s): NA, K, CL, CO2, BUN, CREATININE, CALCIUM, PROT, BILITOT, ALKPHOS, ALT, AST, GLUCOSE in the last 168 hours.  Invalid input(s): LABALBU  Discharge Medications:  Allergies as of 10/01/2018   No Known Allergies     Medication List    TAKE these medications   amiodarone 200 MG tablet Commonly known as:  PACERONE TAKE 1 TABLET BY MOUTH EVERY DAY   atorvastatin 40 MG tablet Commonly known as:  LIPITOR TAKE 1 TABLET BY MOUTH EVERY DAY AT 6PM What changed:  See the new instructions.   benzonatate 200 MG capsule Commonly known as:  TESSALON Take 1 capsule (200 mg total) by mouth 3 (  three) times daily as needed for cough.   CALCIUM 600 + D PO Take 1 tablet by mouth 2 (two) times daily.   carvedilol 3.125 MG tablet Commonly known as:  COREG Take 3.125 mg by mouth 2 (two) times daily with a meal.   chlorpheniramine 4 MG tablet Commonly known as:  CHLOR-TRIMETON Take 4 mg by mouth every 4 (four) hours as needed for allergies.   famotidine 20 MG tablet Commonly known as:  PEPCID TAKE 1 TABLET BY MOUTH EVERYDAY AT  BEDTIME What changed:    how much to take  how to take this  when to take this  reasons to take this  additional instructions   furosemide 20 MG tablet Commonly known as:  LASIX TAKE 1 TABLET BY MOUTH EVERY DAY   losartan 25 MG tablet Commonly known as:  COZAAR Take 1 tablet (25 mg total) by mouth 2 (two) times daily.   pantoprazole 40 MG tablet Commonly known as:  PROTONIX TAKE 1 TABLET BY MOUTH EVERY DAY 3O TO 60 MINS BEFORE FIRST MEAL OF THE DAY Notes to patient:  This medicine is listed as "patient reports not taking" Please confirm with your primary doctor or original prescribing physician regarding this medicine    spironolactone 25 MG tablet Commonly known as:  ALDACTONE Take 1 tablet (25 mg total) by mouth daily.       Disposition:  Home Discharge Instructions    Diet - low sodium heart healthy   Complete by:  As directed    Increase activity slowly   Complete by:  As directed      Follow-up Information    [facility] Follow up.   Specialty:  Cardiology Why:  10/15/2018 @ 9:00AM, wound check visit Contact information: [address] [place] [address] [phone]       Savannah Maw, MD Follow up.   Specialty:  Cardiology Why:  01/07/2019 @ 12:00PM (noon) Contact information: [address] [place] [address] [phone]           Duration of Discharge Encounter: Greater than 30 minutes including physician time.  Savannah Fredrickson, PA-C 10/01/2018 10:18 AM   EP Attending Patient seen and examined. Agree with above. She is doing well s/p Biv ICD insertion. Her CXR looks good. She is sstable for DC home. Usual followup.  Leonia Reeves.D.

## 2018-10-01 ENCOUNTER — Ambulatory Visit: Payer: 59

## 2018-10-01 ENCOUNTER — Ambulatory Visit (HOSPITAL_COMMUNITY): Payer: 59

## 2018-10-01 ENCOUNTER — Encounter (HOSPITAL_COMMUNITY): Payer: Self-pay | Admitting: Internal Medicine

## 2018-10-01 DIAGNOSIS — I428 Other cardiomyopathies: Secondary | ICD-10-CM | POA: Diagnosis not present

## 2018-10-01 DIAGNOSIS — I5023 Acute on chronic systolic (congestive) heart failure: Secondary | ICD-10-CM

## 2018-10-01 MED FILL — Gentamicin Sulfate Inj 40 MG/ML: INTRAMUSCULAR | Qty: 80 | Status: AC

## 2018-10-01 MED FILL — Cefazolin Sodium-Dextrose IV Solution 2 GM/100ML-4%: INTRAVENOUS | Qty: 100 | Status: AC

## 2018-10-01 NOTE — Discharge Instructions (Signed)
° ° °  Supplemental Discharge Instructions for  Pacemaker/Defibrillator Patients    PLEASE SEND A REMOTE TRANSMISSION ONCE HOME    Activity No heavy lifting or vigorous activity with your left/right arm for 6 to 8 weeks.  Do not raise your left/right arm above your head for one week.  Gradually raise your affected arm as drawn below.              10/04/2018                10/05/2018                10/06/2018               10/07/2018 __  NO DRIVING for  1 week   ; you may begin driving on  1/61/0960   .  WOUND CARE - Keep the wound area clean and dry.  Do not get this area wet for one week. No showers for one week; you may shower on  10/07/2018   . - The tape/steri-strips on your wound will fall off; do not pull them off.  No bandage is needed on the site.  DO  NOT apply any creams, oils, or ointments to the wound area. - If you notice any drainage or discharge from the wound, any swelling or bruising at the site, or you develop a fever > 101? F after you are discharged home, call the office at once.  Special Instructions - You are still able to use cellular telephones; use the ear opposite the side where you have your pacemaker/defibrillator.  Avoid carrying your cellular phone near your device. - When traveling through airports, show security personnel your identification card to avoid being screened in the metal detectors.  Ask the security personnel to use the hand wand. - Avoid arc welding equipment, MRI testing (magnetic resonance imaging), TENS units (transcutaneous nerve stimulators).  Call the office for questions about other devices. - Avoid electrical appliances that are in poor condition or are not properly grounded. - Microwave ovens are safe to be near or to operate.  Additional information for defibrillator patients should your device go off: - If your device goes off ONCE and you feel fine afterward, notify the device clinic nurses. - If your device goes off ONCE and you do  not feel well afterward, call 911. - If your device goes off TWICE, call 911. - If your device goes off THREE times in one day, call 911.  DO NOT DRIVE YOURSELF OR A FAMILY MEMBER WITH A DEFIBRILLATOR TO THE HOSPITAL--CALL 911.

## 2018-10-01 NOTE — Plan of Care (Signed)
  Problem: Clinical Measurements: Goal: Will remain free from infection Outcome: Progressing Note:  No s/s of infection noted.  IV ancef s/p PPM.

## 2018-10-11 ENCOUNTER — Telehealth: Payer: Self-pay

## 2018-10-11 NOTE — Telephone Encounter (Signed)
I called and spoke with patient, she could not get applications for virtual visit to work. She is going to keep trying and then try to get in touch with her daughter. I have moved her appointment to Monday so she can come in and have wound check since the virtual visit applications did not work. She will call if she gets them to work.

## 2018-10-11 NOTE — Telephone Encounter (Signed)
I called and spoke with patient, she did not understand how to operate her smart phone to download the applications needed for a video visit. She will call her daughter for help and I will call her back later.

## 2018-10-14 ENCOUNTER — Ambulatory Visit (INDEPENDENT_AMBULATORY_CARE_PROVIDER_SITE_OTHER): Payer: 59 | Admitting: *Deleted

## 2018-10-14 ENCOUNTER — Other Ambulatory Visit: Payer: Self-pay

## 2018-10-14 ENCOUNTER — Ambulatory Visit
Admission: RE | Admit: 2018-10-14 | Discharge: 2018-10-14 | Disposition: A | Discharge status: Home / Self Care | Payer: 59 | Source: Ambulatory Visit | Attending: Internal Medicine | Admitting: Internal Medicine

## 2018-10-14 DIAGNOSIS — Z95810 Presence of automatic (implantable) cardiac defibrillator: Secondary | ICD-10-CM

## 2018-10-14 DIAGNOSIS — I428 Other cardiomyopathies: Secondary | ICD-10-CM | POA: Diagnosis not present

## 2018-10-14 DIAGNOSIS — I5022 Chronic systolic (congestive) heart failure: Secondary | ICD-10-CM

## 2018-10-14 LAB — CUP PACEART INCLINIC DEVICE CHECK
Brady Statistic RA Percent Paced: 1 % — CL
Brady Statistic RV Percent Paced: 100 %
Date Time Interrogation Session: 20200330040000
HighPow Impedance: 59 Ohm
Implantable Lead Implant Date: 20200316
Implantable Lead Implant Date: 20200316
Implantable Lead Implant Date: 20200316
Implantable Lead Location: 753858
Implantable Lead Location: 753859
Implantable Lead Location: 753860
Implantable Lead Model: 292
Implantable Lead Model: 4671
Implantable Lead Model: 7740
Implantable Lead Serial Number: 1013232
Implantable Lead Serial Number: 446892
Implantable Lead Serial Number: 827624
Implantable Pulse Generator Implant Date: 20200316
Lead Channel Impedance Value: 425 Ohm
Lead Channel Impedance Value: 589 Ohm
Lead Channel Impedance Value: 912 Ohm
Lead Channel Pacing Threshold Amplitude: 1 V
Lead Channel Pacing Threshold Amplitude: 1.1 V
Lead Channel Pacing Threshold Amplitude: 2 V
Lead Channel Pacing Threshold Pulse Width: 0.4 ms
Lead Channel Pacing Threshold Pulse Width: 0.4 ms
Lead Channel Pacing Threshold Pulse Width: 1 ms
Lead Channel Sensing Intrinsic Amplitude: 7.5 mV
Lead Channel Setting Pacing Amplitude: 3.5 V
Lead Channel Setting Pacing Amplitude: 3.5 V
Lead Channel Setting Pacing Amplitude: 5 V
Lead Channel Setting Pacing Pulse Width: 0.4 ms
Lead Channel Setting Pacing Pulse Width: 1 ms
Lead Channel Setting Sensing Sensitivity: 0.5 mV
Lead Channel Setting Sensing Sensitivity: 1 mV
Pulse Gen Serial Number: 221486

## 2018-10-14 NOTE — Patient Instructions (Addendum)
Please go to [facility] at [address] for a chest X-ray. Their hours are 8am-5pm.

## 2018-10-14 NOTE — Progress Notes (Signed)
Device Clinic CRT-D wound check appointment. Steri-strips removed. Wound without redness or edema. Incision edges approximated, wound well healed. Normal device function. RA and LV thresholds, sensing, and impedances consistent with implant measurements. RV threshold now 2.0V @ 1.29ms, was 0.7V @ 0.88ms at implant, V-dependent today. Increased RV output to 5.0V @ 1.69ms, RA and LV outputs at 3.5V for extra safety margin until 3 month visit. CXR ordered per GT. Histogram distribution appropriate for patient and level of activity. No mode switches or ventricular arrhythmias noted. Patient educated about wound care, arm mobility, lifting restrictions, shock plan, and Latitude monitor. ROV with GT on 01/07/19.

## 2018-10-15 ENCOUNTER — Ambulatory Visit: Payer: 59

## 2018-10-17 ENCOUNTER — Telehealth: Payer: Self-pay | Admitting: *Deleted

## 2018-10-17 NOTE — Telephone Encounter (Signed)
Notes recorded by Marinus Maw, MD on 10/16/2018 at 9:38 PM EDT Leads are unchanged from the day after implant. GT  Patient made aware of results. She denies questions or concerns at this time and thanked me for my call.

## 2018-10-19 ENCOUNTER — Other Ambulatory Visit: Payer: Self-pay | Admitting: Internal Medicine

## 2018-10-19 DIAGNOSIS — J45991 Cough variant asthma: Secondary | ICD-10-CM

## 2018-10-23 ENCOUNTER — Telehealth: Payer: Self-pay | Admitting: Internal Medicine

## 2018-10-23 NOTE — Telephone Encounter (Signed)
  Patient had defib inserted in March and is calling to find out when she should go back to work. She does not see Dr Ladona Ridgel until June.

## 2018-10-24 NOTE — Telephone Encounter (Signed)
Responded via MyChart.

## 2018-10-29 ENCOUNTER — Other Ambulatory Visit (HOSPITAL_COMMUNITY): Payer: Self-pay | Admitting: Internal Medicine

## 2018-10-30 ENCOUNTER — Other Ambulatory Visit (HOSPITAL_COMMUNITY): Payer: Self-pay | Admitting: Internal Medicine

## 2018-11-05 ENCOUNTER — Telehealth (HOSPITAL_COMMUNITY): Payer: Self-pay | Admitting: *Deleted

## 2018-11-05 NOTE — Telephone Encounter (Signed)
We contacted pt to resch her appt w/Dr Bensimhon to telehealth visit due to covid-19 pandemic.  Appt sch for 5/5 however pt expressed concerns about RTW letter and was transfered to me.  Upon discussion w/pt she has been out of work due to ICD implant and want's to return.  She states she needs a letter stating the dates she has been out and her RTW date along with any restrictions.  She states she wants the letter mailed to her.  Advised letter will have to come Dr Lubertha Basque office as he placed the ICD, advised would send message to his nurse.

## 2018-11-06 NOTE — Telephone Encounter (Signed)
Call returned to Pt.  Advised this nurse would not be able to give her a return to work letter until April 29.  Advised Pt could return to work on May 4 with NO restrictions.  Will complete letter and mail as requested.

## 2018-11-19 ENCOUNTER — Ambulatory Visit (HOSPITAL_COMMUNITY)
Admission: RE | Admit: 2018-11-19 | Discharge: 2018-11-19 | Disposition: A | Discharge status: Home / Self Care | Payer: Medicare Other | Source: Ambulatory Visit | Attending: Internal Medicine | Admitting: Internal Medicine

## 2018-11-19 ENCOUNTER — Other Ambulatory Visit: Payer: Self-pay

## 2018-11-19 ENCOUNTER — Encounter (HOSPITAL_COMMUNITY): Payer: Self-pay

## 2018-11-19 DIAGNOSIS — I493 Ventricular premature depolarization: Secondary | ICD-10-CM

## 2018-11-19 DIAGNOSIS — G4733 Obstructive sleep apnea (adult) (pediatric): Secondary | ICD-10-CM

## 2018-11-19 DIAGNOSIS — I5022 Chronic systolic (congestive) heart failure: Secondary | ICD-10-CM

## 2018-11-19 MED ORDER — FUROSEMIDE 20 MG PO TABS: 20.0000 mg | ORAL_TABLET | Freq: Every day | ORAL | 3 refills | Status: DC

## 2018-11-19 MED ORDER — LOSARTAN POTASSIUM 25 MG PO TABS: 25.0000 mg | ORAL_TABLET | Freq: Two times a day (BID) | ORAL | 3 refills | Status: DC

## 2018-11-19 MED ORDER — CARVEDILOL 3.125 MG PO TABS: 3.1250 mg | ORAL_TABLET | Freq: Two times a day (BID) | ORAL | 3 refills | Status: DC

## 2018-11-19 NOTE — Patient Instructions (Addendum)
Lab work will need to be done tomorrow at 11:15am. The parking code for May is 8006.  Your physician recommends that your have a at home sleep study. Someone will be in contact with you in order to get this set up.  Please follow up with Dr. [name] in 2-3 months. This is scheduled for August 5th at 1:40pm. The parking code for August is 9008.

## 2018-11-19 NOTE — Progress Notes (Signed)
Spoke to pt to review after visit summary instructions. Refilled medications as 90 day supply. No further needs at this time.

## 2018-11-19 NOTE — Addendum Note (Signed)
Encounter addended by: Nicole Cella, RN on: 11/19/2018 10:30 AM  Actions taken: Diagnosis association updated, Order list changed, Clinical Note Signed

## 2018-11-19 NOTE — Progress Notes (Signed)
Heart Failure TeleHealth Note  Due to national recommendations of social distancing due to COVID 19, Audio/video telehealth visit is felt to be most appropriate for this patient at this time.  See MyChart message from today for patient consent regarding telehealth for [facility].  Date:  11/19/2018   ID:  Savannah Bell, DOB [birth year], MRN [id number]  Location: Home  Provider location: [facility] Type of Visit: Established patient  PCP:  Maudie Flakes, FNP  Cardiologist:  [name], MD Primary HF:   Chief Complaint: Heart Failure follow-up   History of Present Illness:  HPI: Savannah Bell is a 68 y.o. female with chronic systolic HF due to NICM and history of  frequent PVCs.   Admitted 5/28-12/16/17 with acute systolic HF. Echo showed EF 15%. R/LHC showed normal coronaries and normal LV pressures. cMRI showed EF 17% with no LGE. HF team consulted. She was noted to have frequent PVC's on tele. Loaded with IV amiodarone, then transitioned to PO. She was diuresed with IV lasix, then transitioned to lasix 20 mg daily. HF medications optimized. DC weight: 145 lbs.   We saw her in 2/20 and ding well but EF persistently 20% despite suppression of PVCs. Losartan increased and referred for ICD, CPX and sleeps study.  Underwent implant of BSci CRT-D on 09/30/18 by Dr. Ladona Ridgel. CPX showed moderate to severe HF limitation. Has not had sleep study yet.   She presents via Web designer for a telehealth visit today due to COVID pandemic. Doing well overall. Unfortunately the gym is closed so she can't go there but is walking at home daily. Denies significant DOE, orthopnea or PND. No edema. + chronic cough. Went to PCP about a month ago and told her "kidneys were worse." Taking lasix  daily   Savannah Bell denies symptoms worrisome for COVID 19.   Echo 08/22/2018: EF 20%,  Echo 03/27/18 EF 20-25% but LV less  dilated  CPX 08/26/18  FVC 2.31 (109%)    FEV1 1.85 (109%)     FEV1/FVC 80 (100%)     MVV 74 (95%)    Resting HR: 72 Peak HR: 137  (89% age predicted max HR) BP rest: 98/60 BP peak: 112/60 Peak VO2: 11.7 (64% predicted peak VO2) VE/VCO2 slope: 41 OUES: 0.75 Peak RER: 1.13 VE/MVV: 46% O2pulse: 6  (75% predicted O2pulse)   Studies:  [facility] 5/29:that showed normal coronary arteries, EF 10-20%, normal LV filling pressures(no right sided pressures recorded).   Cardiac MRI5/30/19:EF 17%, findings consistent with NICM with no evidence ofLGE to suggestinflammatory or infiltrative cardiomyopathy. who presents via Web designer for a telehealth visit today.       Past Medical History:  Diagnosis Date  . History of blood transfusion    "when I had colon resection" (12/11/2017)  . Migraine    "stopped in the 1970s" (12/11/2017)  . Mitral valve prolapse    Past Surgical History:  Procedure Laterality Date  . ABDOMINAL HYSTERECTOMY  1998  . APPENDECTOMY  2003  . APPLICATION OF WOUND VAC  09/05/2011   Procedure: APPLICATION OF WOUND VAC;  Surgeon: Emelia Loron, MD;  Location: [facility];  Service: General;  Laterality: N/A;  . BIV ICD INSERTION CRT-D N/A 09/30/2018   Procedure: BIV ICD INSERTION CRT-D;  Surgeon: Marinus Maw, MD;  [facility]: [facility];  Service: Cardiovascular;  Laterality: N/A;  . BOWEL RESECTION  09/05/2011   Procedure: SMALL BOWEL RESECTION;  Surgeon:  Emelia Loron, MD;  Location: [facility];  Service: General;  Laterality: N/A;  [place] WITH HAMMERTOE RECONSTRUCTION Bilateral    "& shortened toes"  . CESAREAN SECTION  1972  . COLON SURGERY    . LAPAROTOMY  09/05/2011   Procedure: EXPLORATORY LAPAROTOMY;  Surgeon: Emelia Loron, MD;  Location: [facility];  Service: General;  Laterality: N/A;  . LEFT HEART CATH AND CORONARY ANGIOGRAPHY N/A 12/12/2017   Procedure: LEFT HEART CATH AND CORONARY ANGIOGRAPHY;  Surgeon:  Lyn Records, MD;  [facility]: [facility];  Service: Cardiovascular;  Laterality: N/A;  . TUBAL LIGATION  1974     Current Outpatient Medications  Medication Sig Dispense Refill  . amiodarone (PACERONE) 200 MG tablet TAKE 1 TABLET BY MOUTH EVERY DAY (Patient taking differently: Take 200 mg by mouth daily. ) 90 tablet 1  . atorvastatin (LIPITOR) 40 MG tablet TAKE 1 TABLET BY MOUTH EVERY DAY AT 6PM (Patient taking differently: Take 40 mg by mouth daily at 6 PM. ) 90 tablet 3  . benzonatate (TESSALON) 200 MG capsule Take 1 capsule (200 mg total) by mouth 3 (three) times daily as needed for cough. 45 capsule 2  . Calcium Carb-Cholecalciferol (CALCIUM 600 + D PO) Take 1 tablet by mouth 2 (two) times daily.    . carvedilol (COREG) 3.125 MG tablet Take 3.125 mg by mouth 2 (two) times daily with a meal.    . chlorpheniramine (CHLOR-TRIMETON) 4 MG tablet Take 4 mg by mouth every 4 (four) hours as needed for allergies.    . famotidine (PEPCID) 20 MG tablet TAKE 1 TABLET BY MOUTH EVERYDAY AT BEDTIME (Patient taking differently: Take 20 mg by mouth at bedtime as needed for heartburn. ) 90 tablet 0  . furosemide (LASIX) 20 MG tablet TAKE 1 TABLET BY MOUTH EVERY DAY (Patient taking differently: Take 20 mg by mouth daily. ) 30 tablet 5  . losartan (COZAAR) 25 MG tablet TAKE 1 TABLET BY MOUTH TWICE A DAY 30 tablet 6  . pantoprazole (PROTONIX) 40 MG tablet TAKE 1 TABLET BY MOUTH EVERY DAY 3O TO 60 MINS BEFORE FIRST MEAL OF THE DAY 90 tablet 1  . spironolactone (ALDACTONE) 25 MG tablet TAKE 1 TABLET BY MOUTH EVERY DAY *DOSE CHANGE 90 tablet 2   No current facility-administered medications for this encounter.     Allergies:   Patient has no known allergies.   Social History:  The patient  reports that she quit smoking about 37 years ago. Her smoking use included cigarettes. She has a 5.00 pack-year smoking history. She quit smokeless tobacco use about 20 years ago.  Her smokeless tobacco use included  snuff and chew. She reports previous alcohol use. She reports that she does not use drugs.   Family History:  The patient's family history includes Sudden Cardiac Death in her mother.   ROS:  Please see the history of present illness.   All other systems are personally reviewed and negative.   Exam:  (Video/Tele Health Call; Exam is subjective and or/visual.) General:  Speaks in full sentences. No resp difficulty. Lungs: Normal respiratory effort with conversation.  Abdomen: Non-distended per patient report Extremities: Pt denies edema. Neuro: Alert & oriented x 3.   Recent Labs: 12/11/2017: B Natriuretic Peptide 902.1 08/22/2018: ALT 81; TSH 1.453 09/13/2018: BUN 17; Creatinine, Ser 1.47; Hemoglobin 12.5; Platelets 169; Potassium 4.7; Sodium 133  Personally reviewed   Wt Readings from Last 3 Encounters:  10/01/18 65.9 kg (145 lb 3.2 oz)  09/10/18 65 kg (143 lb 6.4 oz)  08/22/18 65.3 kg (144 lb)      ASSESSMENT AND PLAN:  1. Chronic systolic HF due to NICM. Normal coronaries on [facility] 12/11/17. cMRI shows no infiltrative or inflammatory CM.No family hx of HF. No ETOH. No hx of HTN. No recent virus. She had frequent PVCs (~20%) while inpatient, which may be causing her HF. She does not snore but thinks she goes apneic. -Echo 12/12/17 EF 15% with grade 1 DD, no LV thrombus, mild MR, trivial TR, PA peak pressure 31 mmHg, and trivial pericardial effusion.  - Echo 03/27/18 EF remains ~ 20-25% but LV less dilated. Personally reviewed by Dr. [name]  - Echo 08/22/2018 EF remains low ~20% personally reviewed by Dr. [name].  - s/p BSCi CRT-D in 3/20 - Stable NYHA II symptoms. However CPX test 2/20 shows significant HF limitation. Will need very close f/u to assess for advanced therapies. Consider RHC at some point in near future.  - Volume status stable by weight  - Continue lasix 20 mg daily. Says recent creatinine was up. Will receheck  - Continue spiro to 25 mg daily.  - Contine losartan  25 mg BID. May cut back to once daily if pt becomes lightheaded or dizzy. - Continue coreg 3.125 mg BID. Intolerant to up-titration due to palpitations.    2. Frequent PVCs -~20%burdenon tele during previous admit - Zio Patch 03/27/18 with sinus rhythm with RBBB, Rare PVCs, No high-grade arrythmias or pauses. Pt triggered events all NSR.  -Most likely cause of her NICM -Continueamiodarone 200 mg daily. CMET, TSH, Free T4 today. - Reiterated importance of annual eye exams with Amiodarone  3. Hyperlipidemia  - Continue statin therapy.  4. Snoring - She has been referred twice for sleep study. States never heard back from them for scheduling. - We will send her an at-home test   5. Anxiety - Per PCP.   COVID screen The patient does not have any symptoms that suggest any further testing/ screening at this time.  Social distancing reinforced today.  Recommended follow-up:  As above  Relevant cardiac medications were reviewed at length with the patient today.   The patient does not have concerns regarding their medications at this time.   The following changes were made today:  As above  Today, I have spent 21 minutes with the patient with telehealth technology discussing the above issues .    Signed, [name], MD  11/19/2018 9:37 AM  [facility] [address] Heart [facility] V[facility] [place] [address] [phone] (office) [phone] (fax)

## 2018-11-20 ENCOUNTER — Encounter (HOSPITAL_COMMUNITY): Payer: 59 | Admitting: Internal Medicine

## 2018-11-20 ENCOUNTER — Other Ambulatory Visit (HOSPITAL_COMMUNITY): Payer: 59

## 2018-11-22 ENCOUNTER — Telehealth (HOSPITAL_COMMUNITY): Payer: Self-pay

## 2018-11-22 NOTE — Telephone Encounter (Addendum)
       Height: 4'11    Weight:145 lbs 3.2oz BMI:28.96 STOP BANG RISK ASSESSMENT S (snore) Have you been told that you snore?     YES   T (tired) Are you often tired, fatigued, or sleepy during the day?   NO  O (obstruction) Do you stop breathing, choke, or gasp during sleep? YES   P (pressure) Do you have or are you being treated for high blood pressure? NO   B (BMI) Is your body index greater than 35 kg/m? NO   A (age) Are you 67 years old or older? YES   N (neck) Do you have a neck circumference greater than 16 inches?   YES/NO   G (gender) Are you a female? NO   TOTAL STOP/BANG "YES" ANSWERS 3                                                                       For Office Use Only              Procedure Order Form    YES to 3+ Stop Bang questions OR two clinical symptoms - patient qualifies for WatchPAT (CPT 95800)     Submit: This Form + Patient Face Sheet + Clinical Note via CloudPAT or Fax: [phone]         Clinical Notes: Will consult Sleep Specialist and refer for management of therapy due to patient increased risk of Sleep Apnea. Ordering a sleep study due to the following two clinical symptoms: Excessive daytime sleepiness G47.10 / Gastroesophageal reflux K21.9 / Nocturia R35.1 / Morning Headaches G44.221 / Difficulty concentrating R41.840 / Memory problems or poor judgment G31.84 / Personality changes or irritability R45.4 / Loud snoring R06.83 / Depression F32.9 / Unrefreshed by sleep G47.8 / Impotence N52.9 / History of high blood pressure R03.0 / Insomnia G47.00

## 2018-11-26 ENCOUNTER — Telehealth (HOSPITAL_COMMUNITY): Payer: Self-pay | Admitting: *Deleted

## 2018-11-26 ENCOUNTER — Other Ambulatory Visit: Payer: Self-pay | Admitting: Internal Medicine

## 2018-11-26 DIAGNOSIS — J45991 Cough variant asthma: Secondary | ICD-10-CM

## 2018-11-26 NOTE — Telephone Encounter (Signed)
Order, OV note, stopbang and demographics faxed to [facility] 

## 2018-11-27 ENCOUNTER — Other Ambulatory Visit: Payer: 59

## 2018-11-29 ENCOUNTER — Other Ambulatory Visit: Payer: 59

## 2018-12-09 ENCOUNTER — Encounter (INDEPENDENT_AMBULATORY_CARE_PROVIDER_SITE_OTHER): Payer: 59 | Admitting: Cardiology

## 2018-12-09 DIAGNOSIS — I5022 Chronic systolic (congestive) heart failure: Secondary | ICD-10-CM

## 2018-12-19 ENCOUNTER — Ambulatory Visit: Payer: 59 | Admitting: Cardiology

## 2018-12-19 ENCOUNTER — Other Ambulatory Visit (HOSPITAL_COMMUNITY): Payer: Self-pay | Admitting: Internal Medicine

## 2018-12-31 ENCOUNTER — Ambulatory Visit: Payer: 59

## 2018-12-31 ENCOUNTER — Telehealth: Payer: Self-pay

## 2018-12-31 ENCOUNTER — Encounter: Payer: 59 | Admitting: Internal Medicine

## 2018-12-31 NOTE — Telephone Encounter (Signed)
LM for pt to call, needs COVID screening prior to in-office appt with Dr Lovena Le on 6/23

## 2019-01-02 ENCOUNTER — Other Ambulatory Visit: Payer: Self-pay

## 2019-01-02 DIAGNOSIS — G4733 Obstructive sleep apnea (adult) (pediatric): Secondary | ICD-10-CM | POA: Insufficient documentation

## 2019-01-02 NOTE — Addendum Note (Signed)
Encounter addended by: Sueanne Margarita, MD on: 01/02/2019 10:17 PM  Actions taken: Charge Capture section accepted, Problem List modified

## 2019-01-03 ENCOUNTER — Ambulatory Visit (INDEPENDENT_AMBULATORY_CARE_PROVIDER_SITE_OTHER): Payer: 59 | Admitting: *Deleted

## 2019-01-03 ENCOUNTER — Telehealth: Payer: Self-pay | Admitting: *Deleted

## 2019-01-03 DIAGNOSIS — I5022 Chronic systolic (congestive) heart failure: Secondary | ICD-10-CM

## 2019-01-03 DIAGNOSIS — I428 Other cardiomyopathies: Secondary | ICD-10-CM | POA: Diagnosis not present

## 2019-01-03 LAB — CUP PACEART REMOTE DEVICE CHECK
Date Time Interrogation Session: 20200619140106
Implantable Lead Implant Date: 20200316
Implantable Lead Implant Date: 20200316
Implantable Lead Implant Date: 20200316
Implantable Lead Location: 753858
Implantable Lead Location: 753859
Implantable Lead Location: 753860
Implantable Lead Model: 292
Implantable Lead Model: 4671
Implantable Lead Model: 7740
Implantable Lead Serial Number: 1013232
Implantable Lead Serial Number: 446892
Implantable Lead Serial Number: 827624
Implantable Pulse Generator Implant Date: 20200316
Pulse Gen Serial Number: 221486

## 2019-01-03 NOTE — Telephone Encounter (Signed)
Informed patient of sleep study results and patient understanding was verbalized. Patient understands her sleep study showed no significant sleep apnea.   Pt is aware and agreeable to normal results.  

## 2019-01-03 NOTE — Telephone Encounter (Signed)
-----   Message from Sueanne Margarita, MD sent at 01/02/2019 10:20 PM EDT ----- Please let patient know that sleep study showed no significant sleep apnea.

## 2019-01-07 ENCOUNTER — Other Ambulatory Visit: Payer: Self-pay

## 2019-01-07 ENCOUNTER — Ambulatory Visit (INDEPENDENT_AMBULATORY_CARE_PROVIDER_SITE_OTHER): Payer: 59 | Admitting: Internal Medicine

## 2019-01-07 ENCOUNTER — Encounter: Payer: Self-pay | Admitting: Internal Medicine

## 2019-01-07 VITALS — BP 106/68 | HR 66 | Ht 59.0 in | Wt 144.0 lb

## 2019-01-07 DIAGNOSIS — I5022 Chronic systolic (congestive) heart failure: Secondary | ICD-10-CM | POA: Diagnosis not present

## 2019-01-07 DIAGNOSIS — I428 Other cardiomyopathies: Secondary | ICD-10-CM

## 2019-01-07 DIAGNOSIS — Z95810 Presence of automatic (implantable) cardiac defibrillator: Secondary | ICD-10-CM

## 2019-01-07 LAB — CUP PACEART INCLINIC DEVICE CHECK
Brady Statistic RA Percent Paced: 1 % — CL
Brady Statistic RV Percent Paced: 100 %
Date Time Interrogation Session: 20200623040000
HighPow Impedance: 68 Ohm
Implantable Lead Implant Date: 20200316
Implantable Lead Implant Date: 20200316
Implantable Lead Implant Date: 20200316
Implantable Lead Location: 753858
Implantable Lead Location: 753859
Implantable Lead Location: 753860
Implantable Lead Model: 292
Implantable Lead Model: 4671
Implantable Lead Model: 7740
Implantable Lead Serial Number: 1013232
Implantable Lead Serial Number: 446892
Implantable Lead Serial Number: 827624
Implantable Pulse Generator Implant Date: 20200316
Lead Channel Impedance Value: 432 Ohm
Lead Channel Impedance Value: 729 Ohm
Lead Channel Impedance Value: 999 Ohm
Lead Channel Pacing Threshold Amplitude: 1.1 V
Lead Channel Pacing Threshold Amplitude: 1.1 V
Lead Channel Pacing Threshold Amplitude: 1.6 V
Lead Channel Pacing Threshold Pulse Width: 0.4 ms
Lead Channel Pacing Threshold Pulse Width: 0.4 ms
Lead Channel Pacing Threshold Pulse Width: 1 ms
Lead Channel Sensing Intrinsic Amplitude: 25 mV
Lead Channel Sensing Intrinsic Amplitude: 6.3 mV
Lead Channel Sensing Intrinsic Amplitude: 8 mV
Lead Channel Setting Pacing Amplitude: 3.5 V
Lead Channel Setting Pacing Amplitude: 3.5 V
Lead Channel Setting Pacing Amplitude: 5 V
Lead Channel Setting Pacing Pulse Width: 0.4 ms
Lead Channel Setting Pacing Pulse Width: 1 ms
Lead Channel Setting Sensing Sensitivity: 0.5 mV
Lead Channel Setting Sensing Sensitivity: 1 mV
Pulse Gen Serial Number: 221486

## 2019-01-07 NOTE — Patient Instructions (Signed)
Medication Instructions:  Your physician recommends that you continue on your current medications as directed. Please refer to the Current Medication list given to you today.  Labwork: None ordered.  Testing/Procedures: None ordered.  Follow-Up: Your physician wants you to follow-up in: 9 months with Dr. Taylor.   You will receive a reminder letter in the mail two months in advance. If you don't receive a letter, please call our office to schedule the follow-up appointment.  Remote monitoring is used to monitor your ICD from home. This monitoring reduces the number of office visits required to check your device to one time per year. It allows us to keep an eye on the functioning of your device to ensure it is working properly. You are scheduled for a device check from home on 04/08/2019. You may send your transmission at any time that day. If you have a wireless device, the transmission will be sent automatically. After your physician reviews your transmission, you will receive a postcard with your next transmission date.  Any Other Special Instructions Will Be Listed Below (If Applicable).  If you need a refill on your cardiac medications before your next appointment, please call your pharmacy.   

## 2019-01-07 NOTE — Progress Notes (Signed)
HPI Savannah Bell returns today 3 months after undergoing insertion of a Biv ICD. She has chronic systolic heart failure. She has no obstructive CAD. She did not have an increase in her EF after her device placement. She was hospitalized last month with worsening heart failure.   No Known Allergies   Current Outpatient Medications  Medication Sig Dispense Refill  . amiodarone (PACERONE) 200 MG tablet TAKE 1 TABLET BY MOUTH EVERY DAY (Patient taking differently: Take 200 mg by mouth daily. ) 90 tablet 1  . atorvastatin (LIPITOR) 40 MG tablet TAKE 1 TABLET BY MOUTH EVERY DAY AT 6PM (Patient taking differently: Take 40 mg by mouth daily at 6 PM. ) 90 tablet 3  . benzonatate (TESSALON) 200 MG capsule Take 1 capsule (200 mg total) by mouth 3 (three) times daily as needed for cough. 45 capsule 2  . Calcium Carb-Cholecalciferol (CALCIUM 600 + D PO) Take 1 tablet by mouth 2 (two) times daily.    . carvedilol (COREG) 3.125 MG tablet Take 1 tablet (3.125 mg total) by mouth 2 (two) times daily with a meal. 180 tablet 3  . chlorpheniramine (CHLOR-TRIMETON) 4 MG tablet Take 4 mg by mouth every 4 (four) hours as needed for allergies.    . famotidine (PEPCID) 20 MG tablet TAKE 1 TABLET BY MOUTH EVERYDAY AT BEDTIME (Patient taking differently: Take 20 mg by mouth at bedtime as needed for heartburn. ) 90 tablet 0  . furosemide (LASIX) 20 MG tablet Take 1 tablet (20 mg total) by mouth daily. 90 tablet 3  . losartan (COZAAR) 25 MG tablet Take 1 tablet (25 mg total) by mouth 2 (two) times daily. 180 tablet 3  . pantoprazole (PROTONIX) 40 MG tablet TAKE 1 TABLET BY MOUTH EVERY DAY 3O TO 60 MINS BEFORE FIRST MEAL OF THE DAY 90 tablet 1  . spironolactone (ALDACTONE) 25 MG tablet TAKE 1 TABLET BY MOUTH EVERY DAY *DOSE CHANGE 90 tablet 2   No current facility-administered medications for this visit.      Past Medical History:  Diagnosis Date  . History of blood transfusion    "when I had colon resection"  (12/11/2017)  . Migraine    "stopped in the 1970s" (12/11/2017)  . Mitral valve prolapse     ROS:   All systems reviewed and negative except as noted in the HPI.   Past Surgical History:  Procedure Laterality Date  . ABDOMINAL HYSTERECTOMY  1998  . APPENDECTOMY  2003  . APPLICATION OF WOUND VAC  09/05/2011   Procedure: APPLICATION OF WOUND VAC;  Surgeon: Emelia Loron, MD;  Location: [facility];  Service: General;  Laterality: N/A;  . BIV ICD INSERTION CRT-D N/A 09/30/2018   Procedure: BIV ICD INSERTION CRT-D;  Surgeon: Marinus Maw, MD;  [facility]: [facility];  Service: Cardiovascular;  Laterality: N/A;  . BOWEL RESECTION  09/05/2011   Procedure: SMALL BOWEL RESECTION;  Surgeon: Emelia Loron, MD;  Location: [facility];  Service: General;  Laterality: N/A;  . BUNIONECTOMY WITH HAMMERTOE RECONSTRUCTION Bilateral    "& shortened toes"  . CESAREAN SECTION  1972  . COLON SURGERY    . LAPAROTOMY  09/05/2011   Procedure: EXPLORATORY LAPAROTOMY;  Surgeon: Emelia Loron, MD;  Location: [facility];  Service: General;  Laterality: N/A;  . LEFT HEART CATH AND CORONARY ANGIOGRAPHY N/A 12/12/2017   Procedure: LEFT HEART CATH AND CORONARY ANGIOGRAPHY;  Surgeon: Lyn Records, MD;  [facility]: [facility];  Service: Cardiovascular;  Laterality: N/A;  . TUBAL LIGATION  1974     Family History  Problem Relation Age of Onset  . Sudden Cardiac Death Mother      Social History   Socioeconomic History  . Marital status: Divorced    Spouse name: Not on file  . Number of children: Not on file  . Years of education: Not on file  . Highest education level: Not on file  Occupational History  . Not on file  Social Needs  . Financial resource strain: Not on file  . Food insecurity    Worry: Not on file    Inability: Not on file  . Transportation needs    Medical: Not on file    Non-medical: Not on file  Tobacco Use  . Smoking status: Former Smoker    Packs/day: 0.50     Years: 10.00    Pack years: 5.00    Types: Cigarettes    Quit date: 1983    Years since quitting: 37.5  . Smokeless tobacco: Former Systems developer    Types: Snuff, Chew    Quit date: 2000  Substance and Sexual Activity  . Alcohol use: Not Currently  . Drug use: Never  . Sexual activity: Not Currently  Lifestyle  . Physical activity    Days per week: Not on file    Minutes per session: Not on file  . Stress: Not on file  Relationships  . Social Herbalist on phone: Not on file    Gets together: Not on file    Attends religious service: Not on file    Active member of club or organization: Not on file    Attends meetings of clubs or organizations: Not on file    Relationship status: Not on file  . Intimate partner violence    Fear of current or ex partner: Not on file    Emotionally abused: Not on file    Physically abused: Not on file    Forced sexual activity: Not on file  Other Topics Concern  . Not on file  Social History Narrative  . Not on file     BP 106/68   Pulse 66   Ht 4\' 11"  (1.499 m)   Wt 144 lb (65.3 kg)   SpO2 98%   BMI 29.08 kg/m   Physical Exam:  Well appearing NAD HEENT: Unremarkable Neck:  No JVD, no thyromegally Lymphatics:  No adenopathy Back:  No CVA tenderness Lungs:  Clear HEART:  Regular rate rhythm, no murmurs, no rubs, no clicks Abd:  soft, positive bowel sounds, no organomegally, no rebound, no guarding Ext:  2 plus pulses, no edema, no cyanosis, no clubbing Skin:  No rashes no nodules Neuro:  CN II through XII intact, motor grossly intact  EKG - NSR with a QRS of 130 ms.  DEVICE  Normal device function.  See PaceArt for details.   Assess/Plan: 1. Chronic systolic heart failure - her symptoms are class 2. She will continue her current meds.  2. ICD -her [employer] device is working normally. Her CHF parameters are stable. 3.

## 2019-01-08 NOTE — Progress Notes (Signed)
Remote ICD transmission.   

## 2019-01-09 NOTE — Progress Notes (Signed)
This encounter was created in error - please disregard.

## 2019-01-09 NOTE — Procedures (Signed)
Please refer to sleep study report in Media

## 2019-01-15 ENCOUNTER — Other Ambulatory Visit: Payer: 59

## 2019-02-19 ENCOUNTER — Encounter (HOSPITAL_COMMUNITY): Payer: Self-pay | Admitting: Internal Medicine

## 2019-02-19 ENCOUNTER — Other Ambulatory Visit: Payer: Self-pay

## 2019-02-19 ENCOUNTER — Ambulatory Visit (HOSPITAL_COMMUNITY)
Admission: RE | Admit: 2019-02-19 | Discharge: 2019-02-19 | Disposition: A | Discharge status: Home / Self Care | Payer: 59 | Source: Ambulatory Visit | Attending: Internal Medicine | Admitting: Internal Medicine

## 2019-02-19 VITALS — BP 118/80 | HR 77 | Wt 153.8 lb

## 2019-02-19 DIAGNOSIS — E785 Hyperlipidemia, unspecified: Secondary | ICD-10-CM | POA: Diagnosis not present

## 2019-02-19 DIAGNOSIS — I341 Nonrheumatic mitral (valve) prolapse: Secondary | ICD-10-CM | POA: Insufficient documentation

## 2019-02-19 DIAGNOSIS — I428 Other cardiomyopathies: Secondary | ICD-10-CM | POA: Diagnosis not present

## 2019-02-19 DIAGNOSIS — Z79899 Other long term (current) drug therapy: Secondary | ICD-10-CM | POA: Diagnosis not present

## 2019-02-19 DIAGNOSIS — R0683 Snoring: Secondary | ICD-10-CM | POA: Diagnosis not present

## 2019-02-19 DIAGNOSIS — Z95810 Presence of automatic (implantable) cardiac defibrillator: Secondary | ICD-10-CM | POA: Diagnosis not present

## 2019-02-19 DIAGNOSIS — I493 Ventricular premature depolarization: Secondary | ICD-10-CM | POA: Diagnosis not present

## 2019-02-19 DIAGNOSIS — Z87891 Personal history of nicotine dependence: Secondary | ICD-10-CM | POA: Diagnosis not present

## 2019-02-19 DIAGNOSIS — I5022 Chronic systolic (congestive) heart failure: Secondary | ICD-10-CM | POA: Insufficient documentation

## 2019-02-19 LAB — COMPREHENSIVE METABOLIC PANEL
ALT: 53 U/L — ABNORMAL HIGH (ref 0–44)
AST: 49 U/L — ABNORMAL HIGH (ref 15–41)
Albumin: 3.8 g/dL (ref 3.5–5.0)
Alkaline Phosphatase: 71 U/L (ref 38–126)
Anion gap: 10 (ref 5–15)
BUN: 16 mg/dL (ref 8–23)
CO2: 27 mmol/L (ref 22–32)
Calcium: 9.2 mg/dL (ref 8.9–10.3)
Chloride: 100 mmol/L (ref 98–111)
Creatinine, Ser: 1.33 mg/dL — ABNORMAL HIGH (ref 0.44–1.00)
GFR calc Af Amer: 48 mL/min — ABNORMAL LOW (ref >60)
GFR calc non Af Amer: 41 mL/min — ABNORMAL LOW (ref >60)
Glucose, Bld: 113 mg/dL — ABNORMAL HIGH (ref 70–99)
Potassium: 4 mmol/L (ref 3.5–5.1)
Sodium: 137 mmol/L (ref 135–145)
Total Bilirubin: 0.9 mg/dL (ref 0.3–1.2)
Total Protein: 6.9 g/dL (ref 6.5–8.1)

## 2019-02-19 LAB — BRAIN NATRIURETIC PEPTIDE: B Natriuretic Peptide: 359.7 pg/mL — ABNORMAL HIGH (ref 0.0–100.0)

## 2019-02-19 LAB — T4, FREE: Free T4: 1.34 ng/dL — ABNORMAL HIGH (ref 0.61–1.12)

## 2019-02-19 LAB — TSH: TSH: 0.895 u[IU]/mL (ref 0.350–4.500)

## 2019-02-19 MED ORDER — AMIODARONE HCL 100 MG PO TABS: 100.0000 mg | ORAL_TABLET | Freq: Every day | ORAL | 3 refills | Status: DC

## 2019-02-19 MED ORDER — ENTRESTO 24-26 MG PO TABS: 1.0000 | ORAL_TABLET | Freq: Two times a day (BID) | ORAL | 5 refills | Status: DC

## 2019-02-19 MED ORDER — FUROSEMIDE 20 MG PO TABS: 20.0000 mg | ORAL_TABLET | ORAL | 3 refills | Status: DC | PRN

## 2019-02-19 NOTE — Progress Notes (Signed)
Heart Failure ClinicNote  Due to national recommendations of social distancing due to [place] 19, Audio/video telehealth visit is felt to be most appropriate for this patient at this time.  See MyChart message from today for patient consent regarding telehealth for [name].  Date:  02/19/2019   ID:  Savannah Bell, DOB January 02, 1951, MRN [id number]  Location: Home  Provider location: [facility] Type of Visit: Established patient  PCP:  Gregor Hams, FNP  Cardiologist:  Glori Bickers, MD Primary HF:   Chief Complaint: Heart Failure follow-up   History of Present Illness:  HPI: Savannah Bell is a 68 y.o. female with chronic systolic HF due to NICM and history of  frequent PVCs.   Admitted 5/28-12/16/17 with acute systolic HF. Echo showed EF 15%. R/LHC showed normal coronaries and normal LV pressures. cMRI 5/19 showed EF 17% with no LGE. HF team consulted. She was noted to have frequent PVC's on tele. Loaded with IV amiodarone, then transitioned to PO. She was diuresed with IV lasix, then transitioned to lasix 20 mg daily. HF medications optimized. DC weight: 145 lbs.   We saw her in 2/20 and doing well but EF persistently 20% despite suppression of PVCs. Losartan increased and referred for ICD, CPX and sleep study.  Underwent implant of BSci CRT-D on 09/30/18 by Dr. Lovena Le. CPX showed moderate to severe HF limitation.   Home sleep study 4/20: No significant OSA. AHI 4.9  She is here for routine follow-up.  Doing very well. Continues to work FT at [employer]. Can do all activities without problem including going up steps and going to the store. No edema, orthopnea or PND. Taking meds as prescribed, Lasix daily. No dizziness. Takes BP at home regularly sbp 115-120 consistently.   Savannah Bell denies symptoms worrisome for COVID 19.   Echo 08/22/2018: EF 20%,  Echo 03/27/18 EF 20-25% but LV less dilated  CPX 08/26/18  FVC  2.31 (109%)    FEV1 1.85 (109%)     FEV1/FVC 80 (100%)     MVV 74 (95%)    Resting HR: 72 Peak HR: 137  (89% age predicted max HR) BP rest: 98/60 BP peak: 112/60 Peak VO2: 11.7 (64% predicted peak VO2) VE/VCO2 slope: 41 OUES: 0.75 Peak RER: 1.13 VE/MVV: 46% O2pulse: 6  (75% predicted O2pulse)   Studies:  [facility] 5/29:that showed normal coronary arteries, EF 10-20%, normal LV filling pressures(no right sided pressures recorded).   Cardiac MRI5/30/19:EF 17%, findings consistent with NICM with no evidence ofLGE to suggestinflammatory or infiltrative cardiomyopathy. who presents via Engineer, civil (consulting) for a telehealth visit today.       Past Medical History:  Diagnosis Date  . History of blood transfusion    "when I had colon resection" (12/11/2017)  . Migraine    "stopped in the 1970s" (12/11/2017)  . Mitral valve prolapse    Past Surgical History:  Procedure Laterality Date  . ABDOMINAL HYSTERECTOMY  1998  . APPENDECTOMY  2003  . APPLICATION OF WOUND VAC  09/05/2011   Procedure: APPLICATION OF WOUND VAC;  Surgeon: Rolm Bookbinder, MD;  Location: [facility];  Service: General;  Laterality: N/A;  . BIV ICD INSERTION CRT-D N/A 09/30/2018   Procedure: BIV ICD INSERTION CRT-D;  Surgeon: Evans Lance, MD;  Location: [facility];  Service: Cardiovascular;  Laterality: N/A;  . BOWEL RESECTION  09/05/2011   Procedure: SMALL BOWEL RESECTION;  Surgeon: Rolm Bookbinder, MD;  Location: [facility];  Service: General;  Laterality: N/A;  . BUNIONECTOMY WITH HAMMERTOE RECONSTRUCTION Bilateral    "& shortened toes"  . CESAREAN SECTION  1972  . COLON SURGERY    . LAPAROTOMY  09/05/2011   Procedure: EXPLORATORY LAPAROTOMY;  Surgeon: Emelia LoronMatthew Wakefield, MD;  Location: [facility];  Service: General;  Laterality: N/A;  . LEFT HEART CATH AND CORONARY ANGIOGRAPHY N/A 12/12/2017   Procedure: LEFT HEART CATH AND CORONARY ANGIOGRAPHY;  Surgeon: Lyn RecordsSmith, Henry W, MD;  [facility];  Service: Cardiovascular;  Laterality: N/A;  . TUBAL LIGATION  1974     Current Outpatient Medications  Medication Sig Dispense Refill  . amiodarone (PACERONE) 200 MG tablet TAKE 1 TABLET BY MOUTH EVERY DAY (Patient taking differently: Take 200 mg by mouth daily. ) 90 tablet 1  . atorvastatin (LIPITOR) 40 MG tablet TAKE 1 TABLET BY MOUTH EVERY DAY AT 6PM (Patient taking differently: Take 40 mg by mouth daily at 6 PM. ) 90 tablet 3  . Calcium Carb-Cholecalciferol (CALCIUM 600 + D PO) Take 1 tablet by mouth 2 (two) times daily.    . carvedilol (COREG) 3.125 MG tablet Take 1 tablet (3.125 mg total) by mouth 2 (two) times daily with a meal. 180 tablet 3  . chlorpheniramine (CHLOR-TRIMETON) 4 MG tablet Take 4 mg by mouth every 4 (four) hours as needed for allergies.    . famotidine (PEPCID) 20 MG tablet TAKE 1 TABLET BY MOUTH EVERYDAY AT BEDTIME (Patient taking differently: Take 20 mg by mouth at bedtime as needed for heartburn. ) 90 tablet 0  . furosemide (LASIX) 20 MG tablet Take 1 tablet (20 mg total) by mouth daily. 90 tablet 3  . losartan (COZAAR) 25 MG tablet Take 1 tablet (25 mg total) by mouth 2 (two) times daily. 180 tablet 3  . pantoprazole (PROTONIX) 40 MG tablet TAKE 1 TABLET BY MOUTH EVERY DAY 3O TO 60 MINS BEFORE FIRST MEAL OF THE DAY 90 tablet 1  . spironolactone (ALDACTONE) 25 MG tablet TAKE 1 TABLET BY MOUTH EVERY DAY *DOSE CHANGE 90 tablet 2   No current facility-administered medications for this encounter.     Allergies:   Patient has no known allergies.   Social History:  The patient  reports that she quit smoking about 37 years ago. Her smoking use included cigarettes. She has a 5.00 pack-year smoking history. She quit smokeless tobacco use about 20 years ago.  Her smokeless tobacco use included snuff and chew. She reports previous alcohol use. She reports that she does not use drugs.   Family History:  The patient's family history includes Sudden Cardiac  Death in her mother.   ROS:  Please see the history of present illness.   All other systems are personally reviewed and negative.   Wt Readings from Last 3 Encounters:  02/19/19 69.8 kg (153 lb 12.8 oz)  01/07/19 65.3 kg (144 lb)  10/01/18 65.9 kg (145 lb 3.2 oz)      Vitals:   02/19/19 1355  BP: 118/80  Pulse: 77  SpO2: 98%  Weight: 69.8 kg (153 lb 12.8 oz)    Exam:  General:  Well appearing. No resp difficulty HEENT: normal Neck: supple. no JVD. Carotids 2+ bilat; no bruits. No lymphadenopathy or thryomegaly appreciated. Cor: PMI nondisplaced. Regular rate & rhythm. No rubs, gallops or murmurs. Lungs: clear Abdomen: soft, nontender, nondistended. No hepatosplenomegaly. No bruits or masses. Good bowel sounds. Extremities: no cyanosis, clubbing, rash, edema Neuro: alert &  orientedx3, cranial nerves grossly intact. moves all 4 extremities w/o difficulty. Affect pleasant  Recent Labs: 08/22/2018: ALT 81; TSH 1.453 09/13/2018: BUN 17; Creatinine, Ser 1.47; Hemoglobin 12.5; Platelets 169; Potassium 4.7; Sodium 133  Personally reviewed   ICD interrogation: HL score 7. Fluid ok. Activity level ~2.5/hr no VT/AF Personally reviewed   ASSESSMENT AND PLAN:  1. Chronic systolic HF due to NICM. Normal coronaries on [facility] 12/11/17. cMRI shows no infiltrative or inflammatory CM.No family hx of HF. No ETOH. No hx of HTN. No recent virus. She had frequent PVCs (~20%) while inpatient, which may be causing her HF. She does not snore but thinks she goes apneic. -Echo 12/12/17 EF 15% with grade 1 DD, no LV thrombus, mild MR, trivial TR, PA peak pressure 31 mmHg, and trivial pericardial effusion.  - Echo 03/27/18 EF 20-25% but LV less dilated. - Echo 08/22/2018 EF remains low ~20% - s/p BSCi CRT-D in 3/20 - Stable NYHA II symptoms despite severe LV dysfunction andCPX test 2/20 showing significant HF limitation. Will need very close f/u to assess for advanced therapies.  - Volume status looks good  on exam and by ICD - Stop losartan 25 bid and lasix 20 daily. Start Entresto 24/26 bid. Use lasix PRN - Continue spiro to 25 mg daily.  - Continue coreg 3.125 mg BID. Intolerant to up-titration due to palpitations.  - Etiology of LV dysfunction remain unclear. Frequent PVCs remains only real smoking gun but we have suppressed PVCs and EF still down. Repeat echo.   2. Frequent PVCs -~20%burdenon tele 5/19 - Zio Patch 03/27/18 with sinus rhythm with RBBB, Rare PVCs, No high-grade arrythmias or pauses. Pt triggered events all NSR.  -Most likely cause of her NICM -Decreaseamiodarone to 100 mg daily. Check labs  - Reiterated importance of annual eye exams with Amiodarone  3. Hyperlipidemia  - Continue statin therapy per PCP  4. Snoring - Sleep study 4/20 negative (AHI 4.9)  Total time spent 35 minutes. Over half that time spent discussing above.   Signed, [name], MD  02/19/2019 1:58 PM  Advanced Heart Failure Clinic [facility] [address] Heart and Vascular [place] [address] [phone] (office) [phone] (fax)

## 2019-02-19 NOTE — Patient Instructions (Signed)
Labs done today. We will contact you only if labs are abnormal.   STOP Losartan  DECREASE amiodarone to 100mg (1 tab) once daily  START Entresto  24-26mg  (1 tab) by mouth two times daily  ONLY take Lasix as needed.   Your physician recommends that you schedule a follow-up appointment in: 2-3 months   Your physician has requested that you have an echocardiogram. Echocardiography is a painless test that uses sound waves to create images of your heart. It provides your doctor with information about the size and shape of your heart and how well your heart's chambers and valves are working. This procedure takes approximately one hour. There are no restrictions for this procedure.  At the [facility], you and your health needs are our priority. As part of our continuing mission to provide you with exceptional heart care, we have created designated Provider Care Teams. These Care Teams include your primary Cardiologist (physician) and Advanced Practice Providers (APPs- Physician Assistants and Nurse Practitioners) who all work together to provide you with the care you need, when you need it.   You may see any of the following providers on your designated Care Team at your next follow up: Marland Kitchen Dr Glori Bickers . Dr Loralie Champagne . Darrick Grinder, NP   Please be sure to bring in all your medications bottles to every appointment.

## 2019-02-20 ENCOUNTER — Ambulatory Visit (HOSPITAL_COMMUNITY)
Admission: RE | Admit: 2019-02-20 | Discharge: 2019-02-20 | Disposition: A | Discharge status: Home / Self Care | Payer: 59 | Source: Ambulatory Visit | Attending: Internal Medicine | Admitting: Internal Medicine

## 2019-02-20 DIAGNOSIS — I5022 Chronic systolic (congestive) heart failure: Secondary | ICD-10-CM

## 2019-02-20 DIAGNOSIS — I340 Nonrheumatic mitral (valve) insufficiency: Secondary | ICD-10-CM | POA: Diagnosis not present

## 2019-02-20 DIAGNOSIS — I341 Nonrheumatic mitral (valve) prolapse: Secondary | ICD-10-CM | POA: Diagnosis not present

## 2019-02-20 DIAGNOSIS — Z95810 Presence of automatic (implantable) cardiac defibrillator: Secondary | ICD-10-CM | POA: Diagnosis not present

## 2019-02-20 LAB — T3, FREE: T3, Free: 2.5 pg/mL (ref 2.0–4.4)

## 2019-02-20 NOTE — Progress Notes (Signed)
  Echocardiogram 2D Echocardiogram has been performed.  Savannah Bell 02/20/2019, 4:22 PM

## 2019-02-28 ENCOUNTER — Other Ambulatory Visit: Payer: Self-pay | Admitting: Physician Assistant

## 2019-03-14 ENCOUNTER — Other Ambulatory Visit: Payer: Self-pay

## 2019-04-08 ENCOUNTER — Ambulatory Visit (INDEPENDENT_AMBULATORY_CARE_PROVIDER_SITE_OTHER): Payer: 59 | Admitting: *Deleted

## 2019-04-08 DIAGNOSIS — I428 Other cardiomyopathies: Secondary | ICD-10-CM

## 2019-04-08 DIAGNOSIS — I5022 Chronic systolic (congestive) heart failure: Secondary | ICD-10-CM

## 2019-04-10 LAB — CUP PACEART REMOTE DEVICE CHECK
Date Time Interrogation Session: 20200924072519
Implantable Lead Implant Date: 20200316
Implantable Lead Implant Date: 20200316
Implantable Lead Implant Date: 20200316
Implantable Lead Location: 753858
Implantable Lead Location: 753859
Implantable Lead Location: 753860
Implantable Lead Model: 292
Implantable Lead Model: 4671
Implantable Lead Model: 7740
Implantable Lead Serial Number: 1013232
Implantable Lead Serial Number: 446892
Implantable Lead Serial Number: 827624
Implantable Pulse Generator Implant Date: 20200316
Pulse Gen Serial Number: 221486

## 2019-04-13 ENCOUNTER — Other Ambulatory Visit: Payer: Self-pay | Admitting: Internal Medicine

## 2019-04-13 DIAGNOSIS — J45991 Cough variant asthma: Secondary | ICD-10-CM

## 2019-04-15 NOTE — Progress Notes (Signed)
Remote ICD transmission.   

## 2019-04-23 ENCOUNTER — Ambulatory Visit (HOSPITAL_COMMUNITY)
Admission: RE | Admit: 2019-04-23 | Discharge: 2019-04-23 | Disposition: A | Discharge status: Home / Self Care | Payer: Medicare Other | Source: Ambulatory Visit | Attending: Internal Medicine | Admitting: Internal Medicine

## 2019-04-23 ENCOUNTER — Encounter (HOSPITAL_COMMUNITY): Payer: Self-pay | Admitting: Internal Medicine

## 2019-04-23 ENCOUNTER — Other Ambulatory Visit: Payer: Self-pay

## 2019-04-23 VITALS — BP 100/74 | HR 68 | Wt 159.6 lb

## 2019-04-23 DIAGNOSIS — I5022 Chronic systolic (congestive) heart failure: Secondary | ICD-10-CM

## 2019-04-23 DIAGNOSIS — I428 Other cardiomyopathies: Secondary | ICD-10-CM | POA: Diagnosis not present

## 2019-04-23 DIAGNOSIS — N183 Chronic kidney disease, stage 3 unspecified: Secondary | ICD-10-CM | POA: Diagnosis not present

## 2019-04-23 DIAGNOSIS — E785 Hyperlipidemia, unspecified: Secondary | ICD-10-CM | POA: Insufficient documentation

## 2019-04-23 DIAGNOSIS — Z95810 Presence of automatic (implantable) cardiac defibrillator: Secondary | ICD-10-CM | POA: Insufficient documentation

## 2019-04-23 DIAGNOSIS — Z87891 Personal history of nicotine dependence: Secondary | ICD-10-CM | POA: Insufficient documentation

## 2019-04-23 DIAGNOSIS — Z79899 Other long term (current) drug therapy: Secondary | ICD-10-CM | POA: Insufficient documentation

## 2019-04-23 DIAGNOSIS — I341 Nonrheumatic mitral (valve) prolapse: Secondary | ICD-10-CM | POA: Insufficient documentation

## 2019-04-23 DIAGNOSIS — I493 Ventricular premature depolarization: Secondary | ICD-10-CM

## 2019-04-23 LAB — COMPREHENSIVE METABOLIC PANEL
ALT: 33 U/L (ref 0–44)
AST: 36 U/L (ref 15–41)
Albumin: 4 g/dL (ref 3.5–5.0)
Alkaline Phosphatase: 70 U/L (ref 38–126)
Anion gap: 10 (ref 5–15)
BUN: 15 mg/dL (ref 8–23)
CO2: 23 mmol/L (ref 22–32)
Calcium: 9.3 mg/dL (ref 8.9–10.3)
Chloride: 103 mmol/L (ref 98–111)
Creatinine, Ser: 1.34 mg/dL — ABNORMAL HIGH (ref 0.44–1.00)
GFR calc Af Amer: 47 mL/min — ABNORMAL LOW (ref >60)
GFR calc non Af Amer: 41 mL/min — ABNORMAL LOW (ref >60)
Glucose, Bld: 107 mg/dL — ABNORMAL HIGH (ref 70–99)
Potassium: 4.6 mmol/L (ref 3.5–5.1)
Sodium: 136 mmol/L (ref 135–145)
Total Bilirubin: 0.7 mg/dL (ref 0.3–1.2)
Total Protein: 7 g/dL (ref 6.5–8.1)

## 2019-04-23 MED ORDER — FARXIGA 10 MG PO TABS: 10.0000 mg | ORAL_TABLET | Freq: Every day | ORAL | 6 refills | Status: DC

## 2019-04-23 NOTE — Patient Instructions (Signed)
Labs today We will only contact you if something comes back abnormal or we need to make some changes. Otherwise no news is good news!  You have been ordered a PYP Scan.  This is done in the Radiology Department of [facility].  When you come for this test please plan to be there 2-3 hours.  Your physician recommends that you schedule a follow-up appointment in: 6 months with Dr Haroldine Laws.  You will get a call to schedule this appointment.   At the [facility], you and your health needs are our priority. As part of our continuing mission to provide you with exceptional heart care, we have created designated Provider Care Teams. These Care Teams include your primary Cardiologist (physician) and Advanced Practice Providers (APPs- Physician Assistants and Nurse Practitioners) who all work together to provide you with the care you need, when you need it.   You may see any of the following providers on your designated Care Team at your next follow up: Marland Kitchen Dr Glori Bickers . Dr Loralie Champagne . Darrick Grinder, NP   Please be sure to bring in all your medications bottles to every appointment.

## 2019-04-23 NOTE — Progress Notes (Addendum)
Advanced Heart Failure Clinic Note    Date:  04/23/2019   ID:  Savannah Bell, DOB 09-23-1950, MRN [id number]  Location: Home  Provider location: [facility] Type of Visit: Established patient  PCP:  Maudie Flakes, FNP  Cardiologist:  [name], MD Primary HF: Bensimhon  Chief Complaint: Heart Failure follow-up   History of Present Illness:  HPI: Savannah Bell is a 68 y.o. female with chronic systolic HF due to NICM and history of  frequent PVCs.   Admitted 5/28-12/16/17 with acute systolic HF. Echo showed EF 15%. R/LHC showed normal coronaries and normal LV pressures. cMRI 5/19 showed EF 17% with no LGE. HF team consulted. She was noted to have frequent PVC's on tele. Loaded with IV amiodarone, then transitioned to PO. She was diuresed with IV lasix, then transitioned to lasix 20 mg daily. HF medications optimized. DC weight: 145 lbs.   We saw her in 2/20 and doing well but EF persistently 20% despite suppression of PVCs. Losartan increased and referred for ICD, CPX and sleep study.  Underwent implant of BSci CRT-D on 09/30/18 by Dr. Ladona Ridgel. CPX showed moderate to severe HF limitation.   Home sleep study 4/20: No significant OSA. AHI 4.9  She is here for routine follow-up.  Doing very well. Continues to work FT at [employer]. Can do all activities without problem including going up steps and going to the store. Says she feels normal. No problems with meds. Says BP runs low with systolics around 100. No dizziness. Denies edema,orthopnea or PND. Worried about her kidneys - was told she has CKD 3.  Echo 02/20/19 EF 25%  Personally reviewed   Echo 08/22/2018: EF 20%,  Echo 03/27/18 EF 20-25% but LV less dilated  CPX 08/26/18  FVC 2.31 (109%)    FEV1 1.85 (109%)     FEV1/FVC 80 (100%)     MVV 74 (95%)    Resting HR: 72 Peak HR: 137  (89% age predicted max HR) BP rest: 98/60 BP peak: 112/60 Peak VO2: 11.7 (64% predicted  peak VO2) VE/VCO2 slope: 41 OUES: 0.75 Peak RER: 1.13 VE/MVV: 46% O2pulse: 6  (75% predicted O2pulse)   Studies:  [facility] 5/29:that showed normal coronary arteries, EF 10-20%, normal LV filling pressures(no right sided pressures recorded).   Cardiac MRI5/30/19:EF 17%, findings consistent with NICM with no evidence ofLGE to suggestinflammatory or infiltrative cardiomyopathy. who presents via Web designer for a telehealth visit today.       Past Medical History:  Diagnosis Date  . History of blood transfusion    "when I had colon resection" (12/11/2017)  . Migraine    "stopped in the 1970s" (12/11/2017)  . Mitral valve prolapse    Past Surgical History:  Procedure Laterality Date  . ABDOMINAL HYSTERECTOMY  1998  . APPENDECTOMY  2003  . APPLICATION OF WOUND VAC  09/05/2011   Procedure: APPLICATION OF WOUND VAC;  Surgeon: Emelia Loron, MD;  Location: [facility];  Service: General;  Laterality: N/A;  . BIV ICD INSERTION CRT-D N/A 09/30/2018   Procedure: BIV ICD INSERTION CRT-D;  Surgeon: Marinus Maw, MD;  [facility];  Service: Cardiovascular;  Laterality: N/A;  . BOWEL RESECTION  09/05/2011   Procedure: SMALL BOWEL RESECTION;  Surgeon: Emelia Loron, MD;  Location: [facility];  Service: General;  Laterality: N/A;  . BUNIONECTOMY WITH HAMMERTOE RECONSTRUCTION Bilateral    "& shortened toes"  . CESAREAN SECTION  1972  .  COLON SURGERY    . LAPAROTOMY  09/05/2011   Procedure: EXPLORATORY LAPAROTOMY;  Surgeon: Emelia Loron, MD;  [facility];  Service: General;  Laterality: N/A;  . LEFT HEART CATH AND CORONARY ANGIOGRAPHY N/A 12/12/2017   Procedure: LEFT HEART CATH AND CORONARY ANGIOGRAPHY;  Surgeon: Lyn Records, MD;  [facility];  Service: Cardiovascular;  Laterality: N/A;  . TUBAL LIGATION  1974     Current Outpatient Medications  Medication Sig Dispense Refill  . amiodarone (PACERONE) 100 MG tablet Take 1  tablet (100 mg total) by mouth daily. 90 tablet 3  . atorvastatin (LIPITOR) 40 MG tablet TAKE 1 TABLET BY MOUTH EVERY DAY AT 6PM 90 tablet 3  . Calcium Carb-Cholecalciferol (CALCIUM 600 + D PO) Take 1 tablet by mouth 2 (two) times daily.    . carvedilol (COREG) 3.125 MG tablet Take 1 tablet (3.125 mg total) by mouth 2 (two) times daily with a meal. 180 tablet 3  . famotidine (PEPCID) 20 MG tablet TAKE 1 TABLET BY MOUTH EVERYDAY AT BEDTIME (Patient taking differently: Take 20 mg by mouth at bedtime as needed for heartburn. ) 90 tablet 0  . furosemide (LASIX) 20 MG tablet Take 1 tablet (20 mg total) by mouth as needed for fluid. 60 tablet 3  . pantoprazole (PROTONIX) 40 MG tablet TAKE 1 TABLET BY MOUTH EVERY DAY 3O TO 60 MINS BEFORE FIRST MEAL OF THE DAY 90 tablet 0  . sacubitril-valsartan (ENTRESTO) 24-26 MG Take 1 tablet by mouth 2 (two) times daily. 60 tablet 5  . spironolactone (ALDACTONE) 25 MG tablet TAKE 1 TABLET BY MOUTH EVERY DAY *DOSE CHANGE 90 tablet 2   No current facility-administered medications for this encounter.     Allergies:   Patient has no known allergies.   Social History:  The patient  reports that she quit smoking about 37 years ago. Her smoking use included cigarettes. She has a 5.00 pack-year smoking history. She quit smokeless tobacco use about 20 years ago.  Her smokeless tobacco use included snuff and chew. She reports previous alcohol use. She reports that she does not use drugs.   Family History:  The patient's family history includes Sudden Cardiac Death in her mother.   ROS:  Please see the history of present illness.   All other systems are personally reviewed and negative.   Wt Readings from Last 3 Encounters:  02/19/19 69.8 kg (153 lb 12.8 oz)  01/07/19 65.3 kg (144 lb)  10/01/18 65.9 kg (145 lb 3.2 oz)      Vitals:   04/23/19 1404  BP: 100/74  Pulse: 68  SpO2: 98%  Weight: 72.4 kg (159 lb 9.6 oz)    Exam:  General:  Well appearing. No resp  difficulty HEENT: normal Neck: supple. no JVD. Carotids 2+ bilat; no bruits. No lymphadenopathy or thryomegaly appreciated. Cor: PMI nondisplaced. Regular rate & rhythm. No rubs, gallops or murmurs. Lungs: clear Abdomen: soft, nontender, nondistended. No hepatosplenomegaly. No bruits or masses. Good bowel sounds. Extremities: no cyanosis, clubbing, rash, edema Neuro: alert & orientedx3, cranial nerves grossly intact. moves all 4 extremities w/o difficulty. Affect pleasant    Recent Labs: 09/13/2018: Hemoglobin 12.5; Platelets 169 02/19/2019: ALT 53; B Natriuretic Peptide 359.7; BUN 16; Creatinine, Ser 1.33; Potassium 4.0; Sodium 137; TSH 0.895  Personally reviewed   ICD interrogation: HL score 7. Fluid ok. Activity level ~2.5/hr no VT/AF Personally reviewed   ASSESSMENT AND PLAN:  1. Chronic systolic HF due to NICM.  Normal coronaries on [facility] 12/11/17. cMRI shows no infiltrative or inflammatory CM.No family hx of HF. No ETOH. No hx of HTN. No recent virus. She had frequent PVCs (~20%) while inpatient, which may be causing her HF. -Echo 12/12/17 EF 15% with grade 1 DD, no LV thrombus, mild MR, trivial TR, PA peak pressure 31 mmHg, and trivial pericardial effusion.  - Echo 03/27/18 EF 20-25% but LV less dilated. - Echo 08/22/2018 EF remains low ~20% - Echo 02/20/19 EF 25% Mildly reduced RV function - cMRI 5/19 EF 17% no LGE - s/p BSCi CRT-D in 3/20 - Stable NYHA I-II symptoms despite severe LV dysfunction and CPX test 2/20 showing significant HF limitation. Will need very close f/u to assess for advanced therapies.  - Volume status looks good on exam and ICD - Continue Entresto 24/26 bid. Use lasix PRN - Continue spiro to 25 mg daily.  - Continue coreg 3.125 mg BID. Intolerant to up-titration. - Start Wilder Glade 10mg  daily  - Etiology of LV dysfunction remain unclear. Frequent PVCs remains only real smoking gun but we have suppressed PVCs and EF still down. Unable to get cMRI due to ICD - Check  PYP - ICD interrogated personally in clinic. HL score 3. No VT/AF.   2. Frequent PVCs -~20%burdenon tele 5/19 - Zio Patch 03/27/18 with sinus rhythm with RBBB, Rare PVCs, No high-grade arrythmias or pauses. Pt triggered events all NSR.  -Continueamiodarone to 100 mg daily. Check labs  - Reiterated importance of annual eye exams with Amiodarone  3. Hyperlipidemia  - Continue statin therapy per PCP  4. Snoring - Sleep study 4/20 negative (AHI 4.9)  5. CKD 3 - baseline creatinine 1.3-1.4. Continue to follow.  - Start Wilder Glade for nephro-protection  Signed, Glori Bickers, MD  04/23/2019 2:15 PM  Advanced Heart Failure [place] [address] Heart and [place] [address] [phone] (office) [phone] (fax)

## 2019-05-07 ENCOUNTER — Telehealth (HOSPITAL_COMMUNITY): Payer: Self-pay

## 2019-05-07 NOTE — Telephone Encounter (Signed)
Attempted to contact PT to advise of scheduled PYP Scan 05/15/19 @ 11:00 AM with 10:45 AM arrival. Unable to reach PT, left v/m advising PT of appt date and time.

## 2019-05-15 ENCOUNTER — Encounter (HOSPITAL_COMMUNITY)
Admission: RE | Admit: 2019-05-15 | Discharge: 2019-05-15 | Disposition: A | Discharge status: Home / Self Care | Payer: 59 | Source: Ambulatory Visit | Attending: Internal Medicine | Admitting: Internal Medicine

## 2019-05-15 ENCOUNTER — Other Ambulatory Visit: Payer: Self-pay

## 2019-05-15 ENCOUNTER — Ambulatory Visit (HOSPITAL_COMMUNITY)
Admission: RE | Admit: 2019-05-15 | Discharge: 2019-05-15 | Disposition: A | Discharge status: Home / Self Care | Payer: 59 | Source: Ambulatory Visit | Attending: Internal Medicine | Admitting: Internal Medicine

## 2019-05-15 DIAGNOSIS — I5022 Chronic systolic (congestive) heart failure: Secondary | ICD-10-CM | POA: Insufficient documentation

## 2019-05-15 MED ORDER — TECHNETIUM TC 99M PYROPHOSPHATE
20.4000 | Freq: Once | INTRAVENOUS | Status: AC | PRN
  Administered 2019-05-15: 20.4 via INTRAVENOUS
  Filled 2019-05-15: qty 21

## 2019-05-23 ENCOUNTER — Other Ambulatory Visit: Payer: Self-pay

## 2019-05-26 ENCOUNTER — Other Ambulatory Visit: Payer: Self-pay | Admitting: Physician Assistant

## 2019-05-30 IMAGING — MR MR CARD MORPHOLOGY WO/W CM
6 of 8 series · 38 of 40 positions shown · IV contrast (Multihance)
Comparison: none

CLINICAL DATA: 67-year-old female with new diagnosis of
non-ischemic cardiomyopathy.

EXAM:
CARDIAC MRI
TECHNIQUE: The patient was scanned on a 1.5 Tesla GE magnet. A dedicated
cardiac coil was used. Functional imaging was done using Fiesta
sequences. [DATE], and 4 chamber views were done to assess for RWMA's.
Modified Ridja rule using a short axis stack was used to
calculate an ejection fraction on a dedicated work station using
Circle software. The patient received 23 cc of Multihance. After 10
minutes inversion recovery sequences were used to assess for
infiltration and scar tissue.
CONTRAST:  23 cc  of Multihance

[Series 6: bSSFP · oblique · 8.0mm · 1.43mm/px · 30 of 400 slices shown (1 of 4)]
[im 1/400]
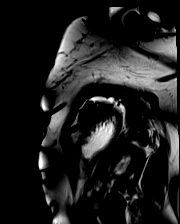
[im 14/400]
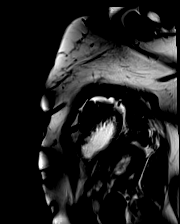
[im 28/400]
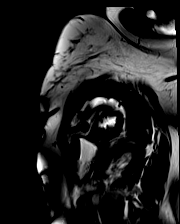
[im 42/400]
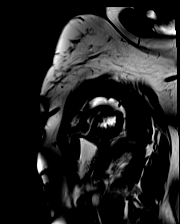
[im 56/400]
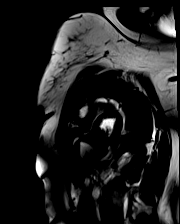
[im 69/400]
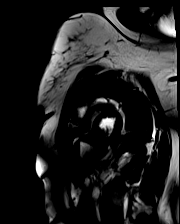
[im 83/400]
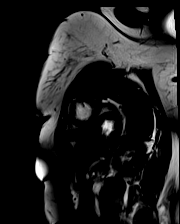
[im 97/400]
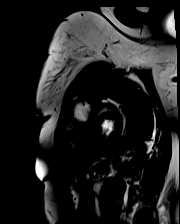
[im 111/400]
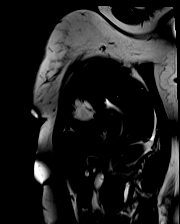
[im 124/400]
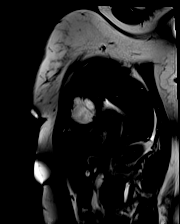
[im 138/400]
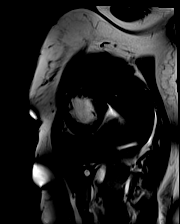
[im 152/400]
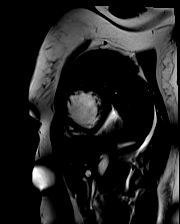
[im 166/400]
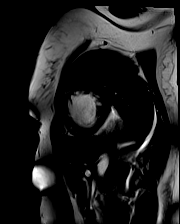
[im 179/400]
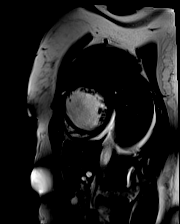
[im 193/400]
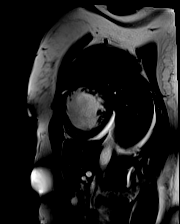
[im 207/400]
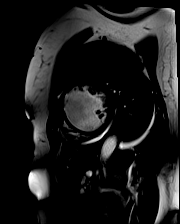
[im 221/400]
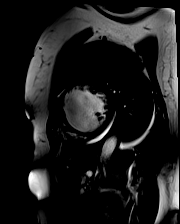
[im 234/400]
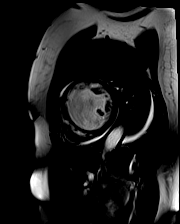
[im 248/400]
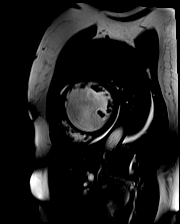
[im 262/400]
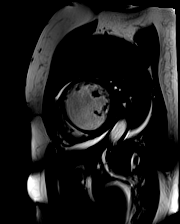
[im 276/400]
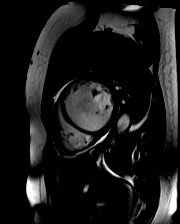
[im 289/400]
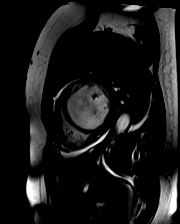
[im 303/400]
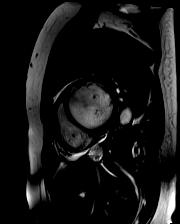
[im 317/400]
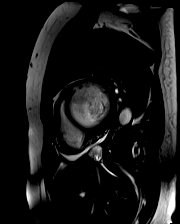
[im 331/400]
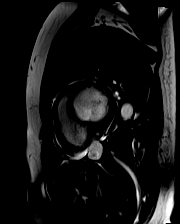
[im 344/400]
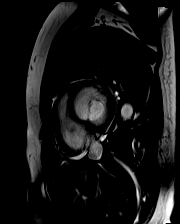
[im 358/400]
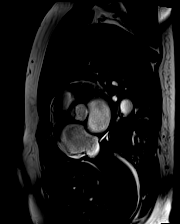
[im 372/400]
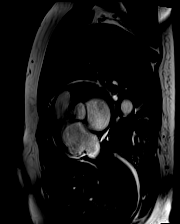
[im 386/400]
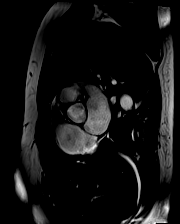
[im 400/400]
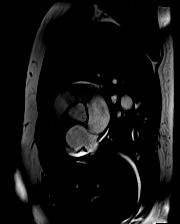

[Series 7: bSSFP · oblique · 6.0mm · 1.41mm/px · 2 of 25 slices shown (2 of 4)]
[im 1/25]
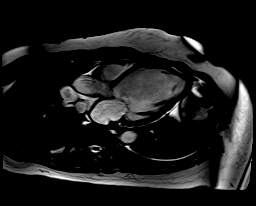
[im 25/25]
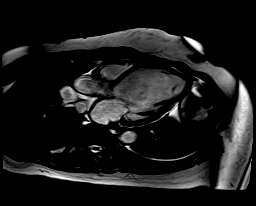

[Series 8: bSSFP · oblique · 6.0mm · 1.41mm/px · 2 of 25 slices shown (3 of 4)]
[im 1/25]
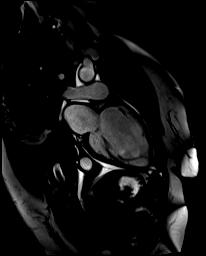
[im 25/25]
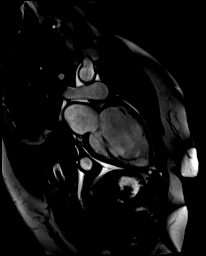

[Series 9: bSSFP · coronal · 6.0mm · 1.41mm/px · 2 of 25 slices shown (4 of 4)]
[im 1/25]
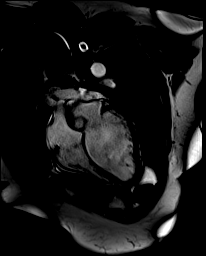
[im 25/25]
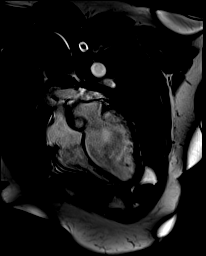

[Series 22: lge short axis_mag · oblique · 8.0mm · 1.50mm/px · 1 of 16 slices shown]
[im 1/16]
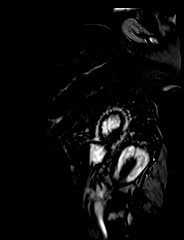

[Series 23: lge short axis_psir · oblique · 8.0mm · 1.50mm/px · 1 of 16 slices shown]
[im 1/16]
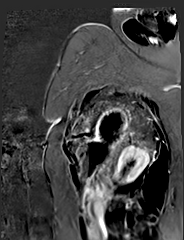

[38 of 40 positions shown; findings below may reference images not displayed]

FINDINGS: 1. Severely dilated left ventricle with normal wall thickness and
severely depressed systolic function (LVEF = 17%). There is severe
global hypokinesis with akinesis of the inferior, anteroseptal and
anteroseptal walls.

There is no late gadolinium enhancement in the left ventricular
myocardium.

LVEDD: 71 mm

LVESD: 68 mm

LVEDV: 247 ml

LVESV: 203 ml

SV: 43 ml

CO: 2.9 L/min

Myocardial mass: 127 g

2. Normal right ventricular size, thickness and systolic function
(LVEF = 53%). There are no regional wall motion abnormalities.

3.  Mildly dilated left and right atrium.

4. Normal size of the aortic root, ascending aorta and pulmonary
artery.

5.  Mild mitral and tricuspid regurgitation.

6.  Normal pericardium.  Mild circumferential pericardial effusion.
IMPRESSION: 1. Severely dilated left ventricle with normal wall thickness and
severely depressed systolic function (LVEF = 17%). There is severe
global hypokinesis with akinesis of the inferior, anteroseptal and
anteroseptal walls.

There is no late gadolinium enhancement in the left ventricular
myocardium.

2. Normal right ventricular size, thickness and systolic function
(LVEF = 53%). There are no regional wall motion abnormalities.

3.  Mildly dilated left and right atrium.

4.  Mild mitral and tricuspid regurgitation.

5.  Normal pericardium.  Mild circumferential pericardial effusion.

Collectively, these findings are consistent with non-ischemic
cardiomyopathy. There is no evidence for inflammatory or
infiltrative cardiomyopathy.

## 2019-07-08 ENCOUNTER — Ambulatory Visit (INDEPENDENT_AMBULATORY_CARE_PROVIDER_SITE_OTHER): Payer: 59 | Admitting: *Deleted

## 2019-07-08 DIAGNOSIS — I428 Other cardiomyopathies: Secondary | ICD-10-CM

## 2019-07-08 LAB — CUP PACEART REMOTE DEVICE CHECK
Date Time Interrogation Session: 20201222133157
Implantable Lead Implant Date: 20200316
Implantable Lead Implant Date: 20200316
Implantable Lead Implant Date: 20200316
Implantable Lead Location: 753858
Implantable Lead Location: 753859
Implantable Lead Location: 753860
Implantable Lead Model: 292
Implantable Lead Model: 4671
Implantable Lead Model: 7740
Implantable Lead Serial Number: 1013232
Implantable Lead Serial Number: 446892
Implantable Lead Serial Number: 827624
Implantable Pulse Generator Implant Date: 20200316
Pulse Gen Serial Number: 221486

## 2019-07-17 ENCOUNTER — Other Ambulatory Visit: Payer: Self-pay | Admitting: Internal Medicine

## 2019-07-17 DIAGNOSIS — J45991 Cough variant asthma: Secondary | ICD-10-CM

## 2019-07-29 ENCOUNTER — Other Ambulatory Visit (HOSPITAL_COMMUNITY): Payer: Self-pay | Admitting: Internal Medicine

## 2019-09-16 ENCOUNTER — Other Ambulatory Visit (HOSPITAL_COMMUNITY): Payer: Self-pay | Admitting: Internal Medicine

## 2019-09-21 ENCOUNTER — Other Ambulatory Visit (HOSPITAL_COMMUNITY): Payer: Self-pay | Admitting: Internal Medicine

## 2019-10-07 ENCOUNTER — Ambulatory Visit (INDEPENDENT_AMBULATORY_CARE_PROVIDER_SITE_OTHER): Payer: 59 | Admitting: *Deleted

## 2019-10-07 DIAGNOSIS — I428 Other cardiomyopathies: Secondary | ICD-10-CM

## 2019-10-08 LAB — CUP PACEART REMOTE DEVICE CHECK
Battery Remaining Longevity: 108 mo
Battery Remaining Percentage: 100 %
Brady Statistic RA Percent Paced: 0 %
Brady Statistic RV Percent Paced: 100 %
Date Time Interrogation Session: 20210323175500
HighPow Impedance: 70 Ohm
Implantable Lead Implant Date: 20200316
Implantable Lead Implant Date: 20200316
Implantable Lead Implant Date: 20200316
Implantable Lead Location: 753858
Implantable Lead Location: 753859
Implantable Lead Location: 753860
Implantable Lead Model: 292
Implantable Lead Model: 4671
Implantable Lead Model: 7740
Implantable Lead Serial Number: 1013232
Implantable Lead Serial Number: 446892
Implantable Lead Serial Number: 827624
Implantable Pulse Generator Implant Date: 20200316
Lead Channel Impedance Value: 1124 Ohm
Lead Channel Impedance Value: 416 Ohm
Lead Channel Impedance Value: 806 Ohm
Lead Channel Setting Pacing Amplitude: 2.5 V
Lead Channel Setting Pacing Amplitude: 2.5 V
Lead Channel Setting Pacing Amplitude: 2.8 V
Lead Channel Setting Pacing Pulse Width: 0.4 ms
Lead Channel Setting Pacing Pulse Width: 1 ms
Lead Channel Setting Sensing Sensitivity: 0.5 mV
Lead Channel Setting Sensing Sensitivity: 1 mV
Pulse Gen Serial Number: 221486

## 2019-11-26 ENCOUNTER — Encounter: Payer: Self-pay | Admitting: Internal Medicine

## 2019-11-26 ENCOUNTER — Ambulatory Visit (INDEPENDENT_AMBULATORY_CARE_PROVIDER_SITE_OTHER): Payer: 59

## 2019-11-26 ENCOUNTER — Ambulatory Visit: Payer: 59 | Admitting: Internal Medicine

## 2019-11-26 ENCOUNTER — Other Ambulatory Visit: Payer: Self-pay

## 2019-11-26 DIAGNOSIS — J45991 Cough variant asthma: Secondary | ICD-10-CM

## 2019-11-26 MED ORDER — PANTOPRAZOLE SODIUM 40 MG PO TBEC: DELAYED_RELEASE_TABLET | ORAL | 0 refills | Status: DC

## 2019-11-26 MED ORDER — BENZONATATE 200 MG PO CAPS: ORAL_CAPSULE | ORAL | 1 refills | Status: DC

## 2019-11-26 MED ORDER — FAMOTIDINE 20 MG PO TABS: ORAL_TABLET | ORAL | 11 refills | Status: DC

## 2019-11-26 NOTE — Patient Instructions (Addendum)
Take tessalon 200 mg three times daily until no longer coughing at all then as needed   For drainage / throat tickle try take CHLORPHENIRAMINE  4 mg  (Chlortab 4mg   at should be easiest to find in the green box)  take one every 4 hours as needed - available over the counter- may cause drowsiness so start with just a bedtime dose or two and see how you tolerate it before trying in daytime    Pantoprazole (protonix) 40 mg   Take  30-60 min before first meal of the day and Pepcid (famotidine)  20 mg one after supper  until return to office - this is the best way to tell whether stomach acid is contributing to your problem.    GERD (REFLUX)  is an extremely common cause of respiratory symptoms just like yours , many times with no obvious heartburn at all.    It can be treated with medication, but also with lifestyle changes including elevation of the head of your bed (ideally with 6 -8inch blocks under the headboard of your bed),  Smoking cessation, avoidance of late meals, excessive alcohol, and avoid fatty foods, chocolate, peppermint, colas, red wine, and acidic juices such as orange juice.  NO MINT OR MENTHOL PRODUCTS SO NO COUGH DROPS  USE SUGARLESS CANDY INSTEAD (Jolley ranchers or Stover's or Life Savers) or even ice chips will also do - the key is to swallow to prevent all throat clearing. NO OIL BASED VITAMINS - use powdered substitutes.  Avoid fish oil when coughing.    Please remember to go to the  x-ray department  for your tests - we will call you with the results when they are available    I strongly recommend you reconsider the vaccination - I would prefer the Pfizer    Please schedule a follow up office visit in 4 weeks, sooner if needed

## 2019-11-26 NOTE — Progress Notes (Signed)
Savannah Bell, female    DOB: [birth year],   MRN: [id number]    Brief patient profile:  40 yobf  Quit smoking 1983 s resp symptoms  with  Onset sinus problems while living in same home in  [place] around 2013   with springtime = fall sniffling/ sneezing/ congestion > rx by PCP with flonase and improved but returned every season sometimes with cough better with clariton prn but then Aug 2019 no better with clariton or robitussin / prescription cough meds and  Persisted daily  so referred to pulmonary clinic 05/16/2018 by Dr Nicki Reaper with h/o chf but no improvement in cough with adequate diuresis.   History of Present Illness  05/16/2018  Pulmonary/ 1st office eval  Chief Complaint  Patient presents with  . Pulmonary Consult    Referred by Dr. Nicki Reaper. Pt c/o cough x 3 months- non prod and with no specific trigger.   Dyspnea:  Not limited by breathing from desired activities   Cough: indolent onset persistent cough since early aug 2019 not present sleeping  s assoc flare of  sniffles  Sleep: able lie flat  SABA use: none rec Stop clariton and start zyrtec 10 mg at bedtime  For cough >  Tessalon pearls 200 mg every 8 hours as needed  Pantoprazole (protonix) 40 mg   Take  30-60 min before first meal of the day and Pepcid (famotidine)  20 mg one @  bedtime until return to office     GERD (REFLUX)  is an extremely common cause of respiratory symptoms just like yours , many times with no obvious heartburn at all.  Please schedule a follow up office visit in 4 weeks, sooner if needed      06/11/2018  f/u ov/ re: cough x aug 2019 wake, not while asleep,  mostly dry  Chief Complaint  Patient presents with  . Follow-up    Cough is unchanged. No new co's.   Dyspnea:  Not limited by breathing from desired activities   Cough: as above  - feels tickle in throat  Sleeping: fine flat 2 pillows  SABA use: none  02: none  Still using mints rec For drainage / throat tickle try take  CHLORPHENIRAMINE  4 mg (chlortab =[facility])  - take one every 4 hours as needed - available over the counter- may cause drowsiness so start with just a bedtime dose or two and see how you tolerate it before trying in daytime   Prednisone 10 mg take  4 each am x 2 days,   2 each am x 2 days,  1 each am x 2 days and stop  Continue protonix 40 mg Take 30-60 min before first meal and right after the last meal take pepcid 20mg  (famotidine) GERD diet  Please schedule a follow up office visit in 2  weeks, sooner if needed  with all medications /inhalers/ solutions in hand so we can verify exactly what you are taking. This includes all medications from all doctors and over the counters   06/25/2018  f/u ov/ re: cough x aug 2019  Chief Complaint  Patient presents with  . Follow-up    Cough has improved some. No new co's.   Dyspnea:  Not limited by breathing from desired activities   Cough:  No longer needing tessalon   Sleeping: during the day works 830 pm to 530 am  /sleeps daytime ok  SABA use: none 02: none rec For drainage / throat tickle try take  CHLORPHENIRAMINE  4 mg - take one every 4 hours as needed - available over the counter- may cause drowsiness so start with just a bedtime dose or two and see how you tolerate it before trying in daytime   Any time the cough gets worse >  Take tessalon 200 mg every 6-8 hours as needed Anytime you start coughing any reason >  Try prilosec otc 20mg   Take 30-60 min before first meal of the day and Pepcid ac (famotidine) 20 mg one @  bedtime until cough is completely gone for at least a week without the need for cough suppression    11/26/2019  f/u ov/ re: cough x aug 2019   Has not had either covid vaccine  Chief Complaint  Patient presents with  . Follow-up    chronic cough x 2 years   seemed to be better for couple months then started coughing and every day since Dyspnea:  Not limited by breathing from desired activities   Cough: non  productive/ irritation L side of throat  - no worse since  entresto started on  09/22/19 Does  yardwork or housework and no worse during allergy season Worse at post office  But worse when out in public like church or grocery / no urination  Uses cough dropss Sleeping: resolves while asleep SABA use: none 02: none    No obvious day to day or daytime variability or assoc excess/ purulent sputum or mucus plugs or hemoptysis or cp or chest tightness, subjective wheeze or overt sinus or hb symptoms.   sleeping without nocturnal  or early am exacerbation  of respiratory  c/o's or need for noct saba. Also denies any obvious fluctuation of symptoms with weather or environmental changes or other aggravating or alleviating factors except as outlined above   No unusual exposure hx or h/o childhood pna/ asthma or knowledge of premature birth.  Current Allergies, Complete Past Medical History, Past Surgical History, Family History, and Social History were reviewed in 11/22/19 record.  ROS  The following are not active complaints unless bolded Hoarseness, sore throat= globus sensation , dysphagia, dental problems, itching, sneezing,  nasal congestion or discharge of excess mucus or purulent secretions, ear ache,   fever, chills, sweats, unintended wt loss or wt gain, classically pleuritic or exertional cp,  orthopnea pnd or arm/hand swelling  or leg swelling, presyncope, palpitations, abdominal pain, anorexia, nausea, vomiting, diarrhea  or change in bowel habits or change in bladder habits, change in stools or change in urine, dysuria, hematuria,  rash, arthralgias, visual complaints, headache, numbness, weakness or ataxia or problems with walking or coordination,  change in mood or  memory.        Current Meds  Medication Sig  . amiodarone (PACERONE) 100 MG tablet Take 1 tablet (100 mg total) by mouth daily.  [employer] atorvastatin (LIPITOR) 40 MG tablet TAKE 1 TABLET BY MOUTH EVERY DAY  AT 6PM  . Calcium Carb-Cholecalciferol (CALCIUM 600 + D PO) Take 1 tablet by mouth 2 (two) times daily.  . carvedilol (COREG) 3.125 MG tablet Take 1 tablet (3.125 mg total) by mouth 2 (two) times daily with a meal.  . ENTRESTO 24-26 MG TAKE 1 TABLET BY MOUTH TWICE A DAY  . famotidine (PEPCID) 20 MG tablet TAKE 1 TABLET BY MOUTH EVERYDAY AT BEDTIME (Patient taking differently: Take 20 mg by mouth at bedtime as needed for heartburn. )  . FARXIGA 10 MG TABS tablet TAKE 1 TABLET BY MOUTH  DAILY BEFORE BREAKFAST.  . furosemide (LASIX) 20 MG tablet Take 1 tablet (20 mg total) by mouth as needed for fluid.  . pantoprazole (PROTONIX) 40 MG tablet TAKE 1 TABLET BY MOUTH EVERY DAY 3O TO 60 MINS BEFORE FIRST MEAL OF THE DAY  . spironolactone (ALDACTONE) 25 MG tablet TAKE 1 TABLET BY MOUTH EVERY DAY *DOSE CHANGE                   Objective:     amb hoarse female nad    11/26/2019        160   06/25/2018     146   06/11/18 146 lb (66.2 kg)  06/10/18 145 lb 4.5 oz (65.9 kg)  05/16/18 144 lb (65.3 kg)       Vital signs reviewed  11/26/2019  - Note at rest 02 sats  98% on RA     HEENT : pt wearing mask not removed for exam due to covid -19 concerns.    NECK :  without JVD/Nodes/TM/ nl carotid upstrokes bilaterally   LUNGS: no acc muscle use,  Nl contour chest which is clear to A and P bilaterally without cough on insp or exp maneuvers   CV:  RRR  no s3 or murmur or increase in P2, and no edema   ABD:  soft and nontender with nl inspiratory excursion in the supine position. No bruits or organomegaly appreciated, bowel sounds nl  MS:  Nl gait/ ext warm without deformities, calf tenderness, cyanosis or clubbing No obvious joint restrictions   SKIN: warm and dry without lesions    NEURO:  alert, approp, nl sensorium with  no motor or cerebellar deficits apparent.      CXR PA and Lateral:   11/26/2019 :    I personally reviewed images and agree with radiology impression as follows:     No active cardiopulmonary disease.       Assessment

## 2019-11-27 ENCOUNTER — Telehealth: Payer: Self-pay | Admitting: Internal Medicine

## 2019-11-27 ENCOUNTER — Encounter: Payer: Self-pay | Admitting: Internal Medicine

## 2019-11-27 NOTE — Telephone Encounter (Signed)
Advised pt of results. Pt understood and nothing further is needed.   

## 2019-11-27 NOTE — Assessment & Plan Note (Signed)
Onset Aug 2019  FENO 05/16/2018  =   7 - Allergy profile 05/16/2018 >  Eos 0.0 /  IgE  8 RAST neg - Spirometry 05/16/2018  FEV1 1.9 (126%)  Ratio 82 s prior rx/ no curvature - 05/16/2018 rx for gerd  > no change though not compliant with diet  - 06/11/2018  Added 1st gen H1 blockers per guidelines  And short term prednisone  - cough resolved at f/u ov 06/25/2018 > wean off gerd rx and if flares needs gi eval   Says now cough never really resolved though was much better for a few months suggesting irritable larynx syndrome/ upper airway syndrome rather than asthma  Of the three most common causes of  Sub-acute / recurrent or chronic cough, only one (GERD)  can actually contribute to/ trigger  the other two (asthma and post nasal drip syndrome)  and perpetuate the cylce of cough.  While not intuitively obvious, many patients with chronic low grade reflux do not cough until there is a primary insult that disturbs the protective epithelial barrier and exposes sensitive nerve endings.   This is typically viral but can due to PNDS and  either may apply here.    >>> The point is that once this occurs, it is difficult to eliminate the cycle  using anything but a maximally effective acid suppression regimen at least in the short run, accompanied by an appropriate diet to address non acid GERD and control / eliminate pnds with 1st gen H1 blockers per guidelines and then regroup here in 4 weeks with all meds in hand using a trust but verify approach to confirm accurate Medication  Reconciliation The principal here is that until we are certain that the  patients are doing what we've asked, it makes no sense to ask them to do more.    Advised: The standardized cough guidelines published in Chest by Lissa Morales in 2006 are still the best available and consist of a multiple step process (up to 12!) , not a single office visit,  and are intended  to address this problem logically,  with an alogrithm dependent on  response to empiric treatment at  each progressive step  to determine a specific diagnosis with  minimal addtional testing needed. Therefore if adherence is an issue or can't be accurately verified,  it's very unlikely the standard evaluation and treatment will be successful here.    Furthermore, response to therapy (other than acute cough suppression, which should only be used short term with avoidance of narcotic containing cough syrups if possible), can be a gradual process for which the patient is not likely to  perceive immediate benefit.  Unlike going to an eye doctor where the best perscription is almost always the first one and is immediately effective, this is almost never the case in the management of chronic cough syndromes. Therefore the patient needs to commit up front to consistently adhere to recommendations  for up to 6 weeks of therapy directed at the likely underlying problem(s) before the response can be reasonably evaluated.           Each maintenance medication was reviewed in detail including emphasizing most importantly the difference between maintenance and prns and under what circumstances the prns are to be triggered using an action plan format where appropriate.  Total time for H and P, chart review, counseling, teaching device and generating customized AVS unique to this office visit / charting = 30 min for pt not  Seen in 1.5 years for refractory symptoms             .

## 2019-11-27 NOTE — Progress Notes (Signed)
LMTCB

## 2019-12-04 NOTE — Progress Notes (Signed)
Savannah Bell CMA reached on 11/27/19 at 3:45 pm. Advised pt of results. Pt understood and nothing further is needed.

## 2020-01-06 ENCOUNTER — Ambulatory Visit (INDEPENDENT_AMBULATORY_CARE_PROVIDER_SITE_OTHER): Payer: 59 | Admitting: *Deleted

## 2020-01-06 DIAGNOSIS — I428 Other cardiomyopathies: Secondary | ICD-10-CM | POA: Diagnosis not present

## 2020-01-06 LAB — CUP PACEART REMOTE DEVICE CHECK
Battery Remaining Longevity: 96 mo
Battery Remaining Percentage: 100 %
Brady Statistic RA Percent Paced: 0 %
Brady Statistic RV Percent Paced: 100 %
Date Time Interrogation Session: 20210622072800
HighPow Impedance: 69 Ohm
Implantable Lead Implant Date: 20200316
Implantable Lead Implant Date: 20200316
Implantable Lead Implant Date: 20200316
Implantable Lead Location: 753858
Implantable Lead Location: 753859
Implantable Lead Location: 753860
Implantable Lead Model: 292
Implantable Lead Model: 4671
Implantable Lead Model: 7740
Implantable Lead Serial Number: 1013232
Implantable Lead Serial Number: 446892
Implantable Lead Serial Number: 827624
Implantable Pulse Generator Implant Date: 20200316
Lead Channel Impedance Value: 1141 Ohm
Lead Channel Impedance Value: 408 Ohm
Lead Channel Impedance Value: 799 Ohm
Lead Channel Setting Pacing Amplitude: 2.5 V
Lead Channel Setting Pacing Amplitude: 2.5 V
Lead Channel Setting Pacing Amplitude: 2.8 V
Lead Channel Setting Pacing Pulse Width: 0.4 ms
Lead Channel Setting Pacing Pulse Width: 1 ms
Lead Channel Setting Sensing Sensitivity: 0.5 mV
Lead Channel Setting Sensing Sensitivity: 1 mV
Pulse Gen Serial Number: 221486

## 2020-01-07 NOTE — Progress Notes (Signed)
Remote ICD transmission.   

## 2020-01-09 ENCOUNTER — Ambulatory Visit: Payer: 59 | Admitting: Internal Medicine

## 2020-01-09 ENCOUNTER — Encounter: Payer: Self-pay | Admitting: Internal Medicine

## 2020-01-09 ENCOUNTER — Other Ambulatory Visit: Payer: Self-pay

## 2020-01-09 DIAGNOSIS — J45991 Cough variant asthma: Secondary | ICD-10-CM | POA: Diagnosis not present

## 2020-01-09 MED ORDER — GABAPENTIN 100 MG PO CAPS: 100.0000 mg | ORAL_CAPSULE | Freq: Three times a day (TID) | ORAL | 2 refills | Status: DC

## 2020-01-09 NOTE — Patient Instructions (Addendum)
Stop chlorpheniramine and also stop the protonix once the cough is gone and use pecid 20 mg after bfast and supper   Gabapentin 100 mg three time in 24 hours   Strongly encourage you to reconsider the moderna or pfizer vaccines which have proven safe and very effective even against the delta variant.   Please schedule a follow up office visit in 4 weeks, sooner if needed to see one of  our NPs to adjust the gabapentin up if tolerating well to a max of 300 mg qid

## 2020-01-09 NOTE — Progress Notes (Signed)
Savannah Bell, female    DOB: 1951-05-07,   MRN: [id number]    Brief patient profile:  2 yobf  Quit smoking 1983 s resp symptoms  with  Onset sinus problems while living in same home in  [place] around 2013   with springtime = fall sniffling/ sneezing/ congestion > rx by PCP with flonase and improved but returned every season sometimes with cough better with clariton prn but then Aug 2019 no better with clariton or robitussin / prescription cough meds and  Persisted daily  so referred to pulmonary clinic 05/16/2018 by Dr Lorin Picket with h/o chf but no improvement in cough with adequate diuresis.   History of Present Illness  05/16/2018  Pulmonary/ 1st office eval  Chief Complaint  Patient presents with  . Pulmonary Consult    Referred by Dr. Lorin Picket. Pt c/o cough x 3 months- non prod and with no specific trigger.   Dyspnea:  Not limited by breathing from desired activities   Cough: indolent onset persistent cough since early aug 2019 not present sleeping  s assoc flare of  sniffles  Sleep: able lie flat  SABA use: none rec Stop clariton and start zyrtec 10 mg at bedtime  For cough >  Tessalon pearls 200 mg every 8 hours as needed  Pantoprazole (protonix) 40 mg   Take  30-60 min before first meal of the day and Pepcid (famotidine)  20 mg one @  bedtime until return to office     GERD (REFLUX)  is an extremely common cause of respiratory symptoms just like yours , many times with no obvious heartburn at all.  Please schedule a follow up office visit in 4 weeks, sooner if needed      06/11/2018  f/u ov/ re: cough x aug 2019 wake, not while asleep,  mostly dry  Chief Complaint  Patient presents with  . Follow-up    Cough is unchanged. No new co's.   Dyspnea:  Not limited by breathing from desired activities   Cough: as above  - feels tickle in throat  Sleeping: fine flat 2 pillows  SABA use: none  02: none  Still using mints rec For drainage / throat tickle try take  CHLORPHENIRAMINE  4 mg (chlortab =[facility])  - take one every 4 hours as needed - available over the counter- may cause drowsiness so start with just a bedtime dose or two and see how you tolerate it before trying in daytime   Prednisone 10 mg take  4 each am x 2 days,   2 each am x 2 days,  1 each am x 2 days and stop  Continue protonix 40 mg Take 30-60 min before first meal and right after the last meal take pepcid 20mg  (famotidine) GERD diet  Please schedule a follow up office visit in 2  weeks, sooner if needed  with all medications /inhalers/ solutions in hand so we can verify exactly what you are taking. This includes all medications from all doctors and over the counters   06/25/2018  f/u ov/ re: cough x aug 2019  Chief Complaint  Patient presents with  . Follow-up    Cough has improved some. No new co's.   Dyspnea:  Not limited by breathing from desired activities   Cough:  No longer needing tessalon   Sleeping: during the day works 830 pm to 530 am  /sleeps daytime ok  SABA use: none 02: none rec For drainage / throat tickle try take  CHLORPHENIRAMINE  4 mg - take one every 4 hours as needed - available over the counter- may cause drowsiness so start with just a bedtime dose or two and see how you tolerate it before trying in daytime   Any time the cough gets worse >  Take tessalon 200 mg every 6-8 hours as needed Anytime you start coughing any reason >  Try prilosec otc 20mg   Take 30-60 min before first meal of the day and Pepcid ac (famotidine) 20 mg one @  bedtime until cough is completely gone for at least a week without the need for cough suppression    11/26/2019  f/u ov/ re: cough x aug 2019   Has not had either covid vaccine  Chief Complaint  Patient presents with  . Follow-up    chronic cough x 2 years   seemed to be better for couple months then started coughing and every day since Dyspnea:  Not limited by breathing from desired activities   Cough: non  productive/ irritation L side of throat  - no worse since  entresto started on  09/22/19 Does  yardwork or housework and no worse during allergy season Worse at post office  But worse when out in public like church or grocery / no urination  Uses cough dropss Sleeping: resolves while asleep SABA use: none 02: none  rec Take tessalon 200 mg three times daily until no longer coughing at all then as needed  For drainage / throat tickle try take CHLORPHENIRAMINE  4 mg   Pantoprazole (protonix) 40 mg   Take  30-60 min before first meal of the day and Pepcid (famotidine)  20 mg one after supper  until return to office  GERD    I strongly recommend you reconsider the vaccination       01/09/2020  f/u ov/ re: cough x  Aug 2019 p starting endtresto  12/2017  still not vaccinated for covid 19  Chief Complaint  Patient presents with  . Follow-up    Cough is unchanged. She denies any new co's.   Dyspnea:  Not limited by breathing from desired activities   Cough: dry sporadic when awake / never  Occurs  when asleep / no better no h1 or tessalon Sleeping: electric bed 30 degrees/ with 2 pillow SABA use: none  02: none    No obvious day to day or daytime variability or assoc excess/ purulent sputum or mucus plugs or hemoptysis or cp or chest tightness, subjective wheeze or overt sinus or hb symptoms.   Sleeping as above  without nocturnal  or early am exacerbation  of respiratory  c/o's or need for noct saba. Also denies any obvious fluctuation of symptoms with weather or environmental changes or other aggravating or alleviating factors except as outlined above   No unusual exposure hx or h/o childhood pna/ asthma or knowledge of premature birth.  Current Allergies, Complete Past Medical History, Past Surgical History, Family History, and Social History were reviewed in 01/2018 record.  ROS  The following are not active complaints unless bolded Hoarseness, sore  throat, dysphagia, dental problems, itching, sneezing,  nasal congestion or discharge of excess mucus or purulent secretions, ear ache,   fever, chills, sweats, unintended wt loss or wt gain, classically pleuritic or exertional cp,  orthopnea pnd or arm/hand swelling  or leg swelling, presyncope, palpitations, abdominal pain, anorexia, nausea, vomiting, diarrhea  or change in bowel habits or change in bladder habits, change in  stools or change in urine, dysuria, hematuria,  rash, arthralgias, visual complaints, headache, numbness, weakness or ataxia or problems with walking or coordination,  change in mood or  memory.                 Objective:      01/09/2020        158 11/26/2019        160   06/25/2018     146   06/11/18 146 lb (66.2 kg)  06/10/18 145 lb 4.5 oz (65.9 kg)  05/16/18 144 lb (65.3 kg)     Vital signs reviewed  01/09/2020  - Note at rest 02 sats  100% on RA         HEENT : pt wearing mask not removed for exam due to covid -19 concerns.    NECK :  without JVD/Nodes/TM/ nl carotid upstrokes bilaterally   LUNGS: no acc muscle use,  Nl contour chest which is clear to A and P bilaterally without cough on insp or exp maneuvers   CV:  RRR  no s3 or murmur or increase in P2, and no edema   ABD:  soft and nontender with nl inspiratory excursion in the supine position. No bruits or organomegaly appreciated, bowel sounds nl  MS:  Nl gait/ ext warm without deformities, calf tenderness, cyanosis or clubbing No obvious joint restrictions   SKIN: warm and dry without lesions    NEURO:  alert, approp, nl sensorium with  no motor or cerebellar deficits apparent.          Assessment

## 2020-01-10 ENCOUNTER — Encounter: Payer: Self-pay | Admitting: Internal Medicine

## 2020-01-10 NOTE — Assessment & Plan Note (Addendum)
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      Pt informed of the seriousness of COVID 19 infection as a direct risk to lung health  and safey and to close contacts and should continue to wear a facemask in public and minimize exposure to public locations but especially avoid any area or activity where non-close contacts are not observing distancing or wearing an appropriate face mask.  I strongly recommended she take either of the vaccines available through local drugstores based on updated information on millions of Americans treated with the Moderna and [employer] products  which have proven both safe and  effective even against the new delta variant.           Each maintenance medication was reviewed in detail including emphasizing most importantly the difference between maintenance and prns and under what circumstances the prns are to be triggered using an action plan format where appropriate.  Total time for H and P, chart review, counseling,  and generating customized AVS unique to this office visit / charting = 20 min

## 2020-01-26 ENCOUNTER — Telehealth: Payer: Self-pay | Admitting: Internal Medicine

## 2020-01-26 DIAGNOSIS — J45991 Cough variant asthma: Secondary | ICD-10-CM

## 2020-01-26 NOTE — Telephone Encounter (Signed)
Called pt but unable to reach. Left message for her to return call. 

## 2020-01-27 NOTE — Telephone Encounter (Signed)
Patient returning call.  [phone]

## 2020-01-27 NOTE — Telephone Encounter (Signed)
Patient notified, referral order placed.

## 2020-01-27 NOTE — Telephone Encounter (Signed)
LMTCB x2 for pt 

## 2020-01-27 NOTE — Telephone Encounter (Signed)
Patient calling in today saying she does not want to take Gabapentin 100 mg. She reports researching medication and feels there are side effects she is not willing to tolerate. She is requesting an alternative. Please advise.

## 2020-01-27 NOTE — Telephone Encounter (Signed)
There are none - refer to [facility] Dr Lynelle Doctor for second opinion

## 2020-01-30 ENCOUNTER — Other Ambulatory Visit (HOSPITAL_COMMUNITY): Payer: Self-pay | Admitting: Internal Medicine

## 2020-02-06 ENCOUNTER — Ambulatory Visit: Payer: 59 | Admitting: Primary Care

## 2020-02-18 ENCOUNTER — Other Ambulatory Visit (HOSPITAL_COMMUNITY): Payer: Self-pay | Admitting: Internal Medicine

## 2020-02-20 ENCOUNTER — Other Ambulatory Visit: Payer: Self-pay | Admitting: Physician Assistant

## 2020-03-05 ENCOUNTER — Other Ambulatory Visit: Payer: Self-pay | Admitting: Internal Medicine

## 2020-04-06 ENCOUNTER — Ambulatory Visit (INDEPENDENT_AMBULATORY_CARE_PROVIDER_SITE_OTHER): Payer: 59 | Admitting: *Deleted

## 2020-04-06 DIAGNOSIS — I428 Other cardiomyopathies: Secondary | ICD-10-CM

## 2020-04-06 DIAGNOSIS — I5022 Chronic systolic (congestive) heart failure: Secondary | ICD-10-CM

## 2020-04-07 LAB — CUP PACEART REMOTE DEVICE CHECK
Battery Remaining Longevity: 90 mo
Battery Remaining Percentage: 100 %
Brady Statistic RA Percent Paced: 0 %
Brady Statistic RV Percent Paced: 100 %
Date Time Interrogation Session: 20210921175400
HighPow Impedance: 73 Ohm
Implantable Lead Implant Date: 20200316
Implantable Lead Implant Date: 20200316
Implantable Lead Implant Date: 20200316
Implantable Lead Location: 753858
Implantable Lead Location: 753859
Implantable Lead Location: 753860
Implantable Lead Model: 292
Implantable Lead Model: 4671
Implantable Lead Model: 7740
Implantable Lead Serial Number: 1013232
Implantable Lead Serial Number: 446892
Implantable Lead Serial Number: 827624
Implantable Pulse Generator Implant Date: 20200316
Lead Channel Impedance Value: 1207 Ohm
Lead Channel Impedance Value: 427 Ohm
Lead Channel Impedance Value: 778 Ohm
Lead Channel Setting Pacing Amplitude: 2.5 V
Lead Channel Setting Pacing Amplitude: 2.5 V
Lead Channel Setting Pacing Amplitude: 2.8 V
Lead Channel Setting Pacing Pulse Width: 0.4 ms
Lead Channel Setting Pacing Pulse Width: 1 ms
Lead Channel Setting Sensing Sensitivity: 0.5 mV
Lead Channel Setting Sensing Sensitivity: 1 mV
Pulse Gen Serial Number: 221486

## 2020-04-07 NOTE — Progress Notes (Signed)
Remote ICD transmission.   

## 2020-05-04 ENCOUNTER — Other Ambulatory Visit (HOSPITAL_COMMUNITY): Payer: Self-pay | Admitting: Internal Medicine

## 2020-05-07 ENCOUNTER — Other Ambulatory Visit: Payer: Self-pay | Admitting: Internal Medicine

## 2020-05-25 ENCOUNTER — Other Ambulatory Visit: Payer: Self-pay | Admitting: Internal Medicine

## 2020-05-26 ENCOUNTER — Other Ambulatory Visit: Payer: Self-pay | Admitting: Internal Medicine

## 2020-06-01 ENCOUNTER — Other Ambulatory Visit (HOSPITAL_COMMUNITY): Payer: Self-pay | Admitting: *Deleted

## 2020-06-01 MED ORDER — CARVEDILOL 3.125 MG PO TABS: ORAL_TABLET | ORAL | 0 refills | Status: DC

## 2020-06-02 ENCOUNTER — Other Ambulatory Visit (HOSPITAL_COMMUNITY): Payer: Self-pay

## 2020-06-02 MED ORDER — ENTRESTO 24-26 MG PO TABS
1.0000 | ORAL_TABLET | Freq: Two times a day (BID) | ORAL | 3 refills | Status: DC
Start: 2020-06-02 — End: 2020-09-14

## 2020-06-04 ENCOUNTER — Other Ambulatory Visit (HOSPITAL_COMMUNITY): Payer: Self-pay | Admitting: *Deleted

## 2020-06-04 MED ORDER — CARVEDILOL 3.125 MG PO TABS
ORAL_TABLET | ORAL | 0 refills | Status: DC
Start: 2020-06-04 — End: 2020-07-23

## 2020-06-07 ENCOUNTER — Other Ambulatory Visit (HOSPITAL_COMMUNITY): Payer: Self-pay | Admitting: *Deleted

## 2020-07-06 ENCOUNTER — Ambulatory Visit (INDEPENDENT_AMBULATORY_CARE_PROVIDER_SITE_OTHER): Payer: 59

## 2020-07-06 DIAGNOSIS — I428 Other cardiomyopathies: Secondary | ICD-10-CM | POA: Diagnosis not present

## 2020-07-12 LAB — CUP PACEART REMOTE DEVICE CHECK
Battery Remaining Longevity: 102 mo
Battery Remaining Percentage: 100 %
Brady Statistic RA Percent Paced: 0 %
Brady Statistic RV Percent Paced: 100 %
Date Time Interrogation Session: 20211225020300
HighPow Impedance: 73 Ohm
Implantable Lead Implant Date: 20200316
Implantable Lead Implant Date: 20200316
Implantable Lead Implant Date: 20200316
Implantable Lead Location: 753858
Implantable Lead Location: 753859
Implantable Lead Location: 753860
Implantable Lead Model: 292
Implantable Lead Model: 4671
Implantable Lead Model: 7740
Implantable Lead Serial Number: 1013232
Implantable Lead Serial Number: 446892
Implantable Lead Serial Number: 827624
Implantable Pulse Generator Implant Date: 20200316
Lead Channel Impedance Value: 1053 Ohm
Lead Channel Impedance Value: 424 Ohm
Lead Channel Impedance Value: 733 Ohm
Lead Channel Setting Pacing Amplitude: 2.5 V
Lead Channel Setting Pacing Amplitude: 2.5 V
Lead Channel Setting Pacing Amplitude: 2.8 V
Lead Channel Setting Pacing Pulse Width: 0.4 ms
Lead Channel Setting Pacing Pulse Width: 1 ms
Lead Channel Setting Sensing Sensitivity: 0.5 mV
Lead Channel Setting Sensing Sensitivity: 1 mV
Pulse Gen Serial Number: 221486

## 2020-07-20 NOTE — Progress Notes (Signed)
Remote ICD transmission.   

## 2020-07-23 ENCOUNTER — Other Ambulatory Visit (HOSPITAL_COMMUNITY): Payer: Self-pay | Admitting: *Deleted

## 2020-07-23 MED ORDER — CARVEDILOL 3.125 MG PO TABS
ORAL_TABLET | ORAL | 3 refills | Status: DC
Start: 2020-07-23 — End: 2020-11-02

## 2020-07-25 NOTE — Progress Notes (Signed)
Heart Failure TeleHealth Note  Due to national recommendations of social distancing due to COVID 19, Audio/video telehealth visit is felt to be most appropriate for this patient at this time.  See MyChart message from today for patient consent regarding telehealth for [facility].  Date:  07/25/2020   ID:  Savannah Bell, DOB 05/06/1951, MRN [id number]  Location: Home  Provider location: [facility] Type of Visit: Established patient  PCP:  Maudie Flakes, FNP  Cardiologist:  [name], MD Primary HF:   Chief Complaint: Heart Failure follow-up   History of Present Illness:  HPI: Savannah Bell is a 70 y.o. female with chronic systolic HF due to NICM and history of  frequent PVCs.   Admitted 5/28-12/16/17 with acute systolic HF. Echo showed EF 15%. R/LHC showed normal coronaries and normal LV pressures. cMRI showed EF 17% with no LGE. HF team consulted. She was noted to have frequent PVC's on tele. Loaded with IV amiodarone, then transitioned to PO. She was diuresed with IV lasix, then transitioned to lasix 20 mg daily. HF medications optimized. DC weight: 145 lbs.   We saw her in 2/20 and doing well but EF persistently 20% despite suppression of PVCs. Losartan increased and referred for ICD, CPX and sleep study.  Underwent implant of BSci CRT-D on 09/30/18 by Dr. Ladona Ridgel. CPX showed moderate to severe HF limitation. Home sleep study 4/20: No significant OSA. AHI 4.9  Echo 02/20/19 EF 25%  Personally reviewed  PYP 10/20 Read as strongly suggestive of TTR with H/CL of 1.65 but visually it is equivocal at worst (I think negative)  We have not seen her since 10/20  She presents via audio/video conferencing for a telehealth visit today due to ongoing COVID pandemic.   Doing well overall. Continues to work FT at [employer]. Has not gone back to gym or restarted her walking program. Otherwise feels good. Can do all ADLs without a  problem. No CP, SOB, orthopnea, edema or BP.   Savannah Bell denies symptoms worrisome for COVID 19.   Echo 08/22/2018: EF 20%,  Echo 03/27/18 EF 20-25% but LV less dilated  CPX 08/26/18  FVC 2.31 (109%)    FEV1 1.85 (109%)     FEV1/FVC 80 (100%)     MVV 74 (95%)    Resting HR: 72 Peak HR: 137  (89% age predicted max HR) BP rest: 98/60 BP peak: 112/60 Peak VO2: 11.7 (64% predicted peak VO2) VE/VCO2 slope: 41 OUES: 0.75 Peak RER: 1.13 VE/MVV: 46% O2pulse: 6  (75% predicted O2pulse)   Studies:  [facility] 5/29:that showed normal coronary arteries, EF 10-20%, normal LV filling pressures(no right sided pressures recorded).   Cardiac MRI5/30/19:EF 17%, findings consistent with NICM with no evidence ofLGE to suggestinflammatory or infiltrative cardiomyopathy. who presents via Web designer for a telehealth visit today.       Past Medical History:  Diagnosis Date  . History of blood transfusion    "when I had colon resection" (12/11/2017)  . Migraine    "stopped in the 1970s" (12/11/2017)  . Mitral valve prolapse    Past Surgical History:  Procedure Laterality Date  . ABDOMINAL HYSTERECTOMY  1998  . APPENDECTOMY  2003  . APPLICATION OF WOUND VAC  09/05/2011   Procedure: APPLICATION OF WOUND VAC;  Surgeon: Emelia Loron, MD;  Location: [facility];  Service: General;  Laterality: N/A;  . BIV ICD INSERTION CRT-D N/A 09/30/2018   Procedure: BIV  ICD INSERTION CRT-D;  Surgeon: Marinus Maw, MD;  Location: [facility] INVASIVE CV LAB;  Service: Cardiovascular;  Laterality: N/A;  . BOWEL RESECTION  09/05/2011   Procedure: SMALL BOWEL RESECTION;  Surgeon: Emelia Loron, MD;  Location: [facility];  Service: General;  Laterality: N/A;  . BUNIONECTOMY WITH HAMMERTOE RECONSTRUCTION Bilateral    "& shortened toes"  . CESAREAN SECTION  1972  . COLON SURGERY    . LAPAROTOMY  09/05/2011   Procedure: EXPLORATORY LAPAROTOMY;  Surgeon: Emelia Loron, MD;  Location:  [facility];  Service: General;  Laterality: N/A;  . LEFT HEART CATH AND CORONARY ANGIOGRAPHY N/A 12/12/2017   Procedure: LEFT HEART CATH AND CORONARY ANGIOGRAPHY;  Surgeon: Lyn Records, MD;  Location: [facility] INVASIVE CV LAB;  Service: Cardiovascular;  Laterality: N/A;  . TUBAL LIGATION  1974     Current Outpatient Medications  Medication Sig Dispense Refill  . amiodarone (PACERONE) 100 MG tablet TAKE 1 TABLET BY MOUTH EVERY DAY 90 tablet 3  . atorvastatin (LIPITOR) 40 MG tablet Take 1 tablet (40 mg total) by mouth daily. 3rd attempt. Pt needs to make overdue appt with provider before anymore refills 15 tablet 0  . Calcium Carb-Cholecalciferol (CALCIUM 600 + D PO) Take 1 tablet by mouth 2 (two) times daily.    . carvedilol (COREG) 3.125 MG tablet TAKE 1 TABLET BY MOUTH TWICE A DAY WITH A MEAL 60 tablet 3  . famotidine (PEPCID) 20 MG tablet One after supper 30 tablet 11  . FARXIGA 10 MG TABS tablet TAKE 1 TABLET BY MOUTH DAILY BEFORE BREAKFAST. 90 tablet 3  . furosemide (LASIX) 20 MG tablet Take 1 tablet (20 mg total) by mouth as needed for fluid. 60 tablet 3  . gabapentin (NEURONTIN) 100 MG capsule Take 1 capsule (100 mg total) by mouth 3 (three) times daily. One three times daily 90 capsule 2  . pantoprazole (PROTONIX) 40 MG tablet Take 30-60 min before first meal of the day 90 tablet 0  . sacubitril-valsartan (ENTRESTO) 24-26 MG Take 1 tablet by mouth 2 (two) times daily. 180 tablet 3  . spironolactone (ALDACTONE) 25 MG tablet TAKE 1 TABLET BY MOUTH EVERY DAY *DOSE CHANGE 90 tablet 3   No current facility-administered medications for this encounter.    Allergies:   Patient has no known allergies.   Social History:  The patient  reports that she quit smoking about 39 years ago. Her smoking use included cigarettes. She has a 5.00 pack-year smoking history. She quit smokeless tobacco use about 22 years ago.  Her smokeless tobacco use included snuff and chew. She reports previous alcohol use. She  reports that she does not use drugs.   Family History:  The patient's family history includes Sudden Cardiac Death in her mother.   ROS:  Please see the history of present illness.   All other systems are personally reviewed and negative.   BP 114/74  HR 73  WT 155  Exam:  (Video/Tele Health Call; Exam is subjective and or/visual.) General:  Speaks in full sentences. No resp difficulty. Lungs: Normal respiratory effort with conversation.  Abdomen: Non-distended per patient report Extremities: Pt denies edema. Neuro: Alert & oriented x 3.   Recent Labs: No results found for requested labs within last 8760 hours.  Personally reviewed   Wt Readings from Last 3 Encounters:  01/09/20 71.7 kg  11/26/19 72.6 kg  04/23/19 72.4 kg      ASSESSMENT AND PLAN:  1. Chronic systolic HF due  to NICM. Normal coronaries on [facility] 12/11/17. cMRI shows no infiltrative or inflammatory CM.No family hx of HF. No ETOH. No hx of HTN. No recent virus. She had frequent PVCs (~20%) while inpatient, which may be causing her HF. She does not snore but thinks she goes apneic. -Echo 12/12/17 EF 15% with grade 1 DD, no LV thrombus, mild MR, trivial TR, PA peak pressure 31 mmHg, and trivial pericardial effusion.  - Echo 03/27/18 EF 20-25% but LV less dilated - cMRI 5/19 EF 17% no LGE - Echo 08/22/2018 EFremains low ~20% - Echo 02/20/19 EF 25% Mildly reduced RV function - s/p BSCi CRT-D in 3/20 - Stable NYHA I-II symptoms despite severe LV dysfunction and CPX test 2/20 showing significant HF limitation. Will need very close f/u to assess for advanced therapies.  - Volume status looks good. Uses lasix PRN  - Continue Entresto 24/26 bid. I suggested we increase to 49/51 but she wants to stay where she is at -Continuespiro to 25 mg daily.  - Continue coreg 3.125 mg BID. Intolerant to up-titration. - Continut Farxiga 10mg  daily  - Etiology of LV dysfunction remain unclear. Frequent PVCs remains only real smoking gun  but we have suppressed PVCs and EF still down. Unable to get cMRI due to ICD - PYP 10/20 Read as strongly suggestive of TTR with H/CL of 1.65 but visually it is equivocal at worst (I think negative) - RTC in 3 months with echo and bloodwork.  2. Frequent PVCs -~20%burdenon tele during previous admit - Zio Patch 03/27/18 with sinus rhythm with RBBB, Rare PVCs, No high-grade arrythmias or pauses. Pt triggered events all NSR.  -Most likely cause of her NICM -Continueamio 200 mg daily. Can consdier decrease to 100 daily  - Reiterated importance of annual eye exams with Amiodarone  3. Hyperlipidemia  - Continue statin therapy.  4. Snoring - Sleep study negative (AHI 4.9)   5. Anxiety - Per PCP. Stable.   COVID screen The patient does not have any symptoms that suggest any further testing/ screening at this time.  Social distancing reinforced today.  Recommended follow-up:  As above  Relevant cardiac medications were reviewed at length with the patient today.   The patient does not have concerns regarding their medications at this time.   The following changes were made today:  As above  Today, I have spent 14 minutes with the patient with telehealth technology discussing the above issues .    Signed, 05/27/18, MD  07/25/2020 12:57 PM  [facility] [address] [facility] V[facility] [place] [phone] (office) [phone] (fax)

## 2020-07-26 ENCOUNTER — Other Ambulatory Visit: Payer: Self-pay

## 2020-07-26 ENCOUNTER — Encounter (HOSPITAL_COMMUNITY): Payer: Self-pay | Admitting: Internal Medicine

## 2020-07-26 ENCOUNTER — Ambulatory Visit (HOSPITAL_COMMUNITY)
Admission: RE | Admit: 2020-07-26 | Discharge: 2020-07-26 | Disposition: A | Discharge status: Home / Self Care | Payer: 59 | Source: Ambulatory Visit | Attending: Internal Medicine | Admitting: Internal Medicine

## 2020-07-26 ENCOUNTER — Telehealth (HOSPITAL_COMMUNITY): Payer: Self-pay

## 2020-07-26 VITALS — BP 114/74 | HR 73 | Wt 155.0 lb

## 2020-07-26 DIAGNOSIS — I5022 Chronic systolic (congestive) heart failure: Secondary | ICD-10-CM | POA: Diagnosis not present

## 2020-07-26 DIAGNOSIS — I493 Ventricular premature depolarization: Secondary | ICD-10-CM

## 2020-07-26 HISTORY — DX: Heart failure, unspecified: I50.9

## 2020-07-26 NOTE — Telephone Encounter (Signed)
  Patient Consent for Virtual Visit          Savannah Bell has provided verbal consent on 07/26/2020 for a virtual visit (video or telephone).   CONSENT FOR VIRTUAL VISIT FOR:  Savannah Bell  By participating in this virtual visit I agree to the following:  I hereby voluntarily request, consent and authorize [facility] and its employed or contracted physicians, physician assistants, nurse practitioners or other licensed health care professionals (the Practitioner), to provide me with telemedicine health care services (the "Services") as deemed necessary by the treating Practitioner. I acknowledge and consent to receive the Services by the Practitioner via telemedicine. I understand that the telemedicine visit will involve communicating with the Practitioner through live audiovisual communication technology and the disclosure of certain medical information by electronic transmission. I acknowledge that I have been given the opportunity to request an in-person assessment or other available alternative prior to the telemedicine visit and am voluntarily participating in the telemedicine visit.  I understand that I have the right to withhold or withdraw my consent to the use of telemedicine in the course of my care at any time, without affecting my right to future care or treatment, and that the Practitioner or I may terminate the telemedicine visit at any time. I understand that I have the right to inspect all information obtained and/or recorded in the course of the telemedicine visit and may receive copies of available information for a reasonable fee.  I understand that some of the potential risks of receiving the Services via telemedicine include:  Marland Kitchen Delay or interruption in medical evaluation due to technological equipment failure or disruption; . Information transmitted may not be sufficient (e.g. poor resolution of images) to allow for appropriate medical decision making by the  Practitioner; and/or  . In rare instances, security protocols could fail, causing a breach of personal health information.  Furthermore, I acknowledge that it is my responsibility to provide information about my medical history, conditions and care that is complete and accurate to the best of my ability. I acknowledge that Practitioner's advice, recommendations, and/or decision may be based on factors not within their control, such as incomplete or inaccurate data provided by me or distortions of diagnostic images or specimens that may result from electronic transmissions. I understand that the practice of medicine is not an exact science and that Practitioner makes no warranties or guarantees regarding treatment outcomes. I acknowledge that a copy of this consent can be made available to me via my patient portal [facility] MyChart), or I can request a printed copy by calling the office of [facility].    I understand that my insurance will be billed for this visit.   I have read or had this consent read to me. . I understand the contents of this consent, which adequately explains the benefits and risks of the Services being provided via telemedicine.  . I have been provided ample opportunity to ask questions regarding this consent and the Services and have had my questions answered to my satisfaction. . I give my informed consent for the services to be provided through the use of telemedicine in my medical care

## 2020-07-26 NOTE — Addendum Note (Signed)
Encounter addended by: Noralee Space, RN on: 07/26/2020 10:16 AM  Actions taken: Order list changed, Diagnosis association updated, Clinical Note Signed

## 2020-07-26 NOTE — Patient Instructions (Signed)
Your physician recommends that you schedule a follow-up appointment in: 3 months with an echocardiogram, our office will call you to schedule this appointment  If you have any questions or concerns before your next appointment please send [place] a message through [place] or call our office at [phone].    TO LEAVE A MESSAGE FOR THE NURSE SELECT OPTION 2, PLEASE LEAVE A MESSAGE INCLUDING: . YOUR NAME . DATE OF BIRTH . CALL BACK NUMBER . REASON FOR CALL**this is important as we prioritize the call backs  YOU WILL RECEIVE A CALL BACK THE SAME DAY AS LONG AS YOU CALL BEFORE 4:00 PM  At the [facility], you and your health needs are our priority. As part of our continuing mission to provide you with exceptional heart care, we have created designated Provider Care Teams. These Care Teams include your primary Cardiologist (physician) and Advanced Practice Providers (APPs- Physician Assistants and Nurse Practitioners) who all work together to provide you with the care you need, when you need it.   You may see any of the following providers on your designated Care Team at your next follow up: Marland Kitchen Dr [name] . Dr Marca Ancona . Tonye Becket, NP . Robbie Lis, PA . Karle Plumber, PharmD   Please be sure to bring in all your medications bottles to every appointment.

## 2020-07-26 NOTE — Progress Notes (Signed)
Echo order placed per Dr [name], AVS sent to pt via mychart and copy mailed to her.

## 2020-08-11 ENCOUNTER — Other Ambulatory Visit (HOSPITAL_COMMUNITY): Payer: Self-pay | Admitting: Internal Medicine

## 2020-08-18 NOTE — Progress Notes (Signed)
Electrophysiology Office Note Date: 08/19/2020  ID:  Savannah Bell, DOB 03-10-1951, MRN [id number]  PCP: Gregor Hams, FNP Primary Cardiologist: Glori Bickers, MD Electrophysiologist: Cristopher Peru, MD   CC: Routine ICD follow-up  Savannah Bell is a 70 y.o. female seen today for Cristopher Peru, MD for routine electrophysiology followup.  Since last being seen in our clinic the patient reports doing very well.  she denies chest pain, palpitations, dyspnea, PND, orthopnea, nausea, vomiting, dizziness, syncope, edema, weight gain, or early satiety. She has not had ICD shocks.   She was seen virtually by HF clinic 07/26/2020 and reported no difficulties with ADL. No CP, SOB, orthopnea, edema or BP.  Device History: [employer] Scientific BiV ICD implanted 09/2018 for chronic systolic CHF  Echo 07/21/4006 LVEF 25%. Repeat pending.  Past Medical History:  Diagnosis Date  . CHF (congestive heart failure) ([place])   . History of blood transfusion    "when I had colon resection" (12/11/2017)  . Migraine    "stopped in the 1970s" (12/11/2017)  . Mitral valve prolapse    Past Surgical History:  Procedure Laterality Date  . ABDOMINAL HYSTERECTOMY  1998  . APPENDECTOMY  2003  . APPLICATION OF WOUND VAC  09/05/2011   Procedure: APPLICATION OF WOUND VAC;  Surgeon: Rolm Bookbinder, MD;  Location: [facility];  Service: General;  Laterality: N/A;  . BIV ICD INSERTION CRT-D N/A 09/30/2018   Procedure: BIV ICD INSERTION CRT-D;  Surgeon: Evans Lance, MD;  Location: [facility] CV LAB;  Service: Cardiovascular;  Laterality: N/A;  . BOWEL RESECTION  09/05/2011   Procedure: SMALL BOWEL RESECTION;  Surgeon: Rolm Bookbinder, MD;  Location: [facility];  Service: General;  Laterality: N/A;  . BUNIONECTOMY WITH HAMMERTOE RECONSTRUCTION Bilateral    "& shortened toes"  . [place]  . COLON SURGERY    . LAPAROTOMY  09/05/2011   Procedure: EXPLORATORY LAPAROTOMY;  Surgeon: Rolm Bookbinder,  MD;  Location: [facility];  Service: General;  Laterality: N/A;  . LEFT HEART CATH AND CORONARY ANGIOGRAPHY N/A 12/12/2017   Procedure: LEFT HEART CATH AND CORONARY ANGIOGRAPHY;  Surgeon: Belva Crome, MD;  Location: [place] CV LAB;  Service: Cardiovascular;  Laterality: N/A;  . TUBAL LIGATION  1974    Current Outpatient Medications  Medication Sig Dispense Refill  . amiodarone (PACERONE) 100 MG tablet TAKE 1 TABLET BY MOUTH EVERY DAY 90 tablet 3  . atorvastatin (LIPITOR) 40 MG tablet Take 1 tablet (40 mg total) by mouth daily. 3rd attempt. Pt needs to make overdue appt with provider before anymore refills 15 tablet 0  . Calcium Carb-Cholecalciferol (CALCIUM 600 + D PO) Take 1 tablet by mouth 2 (two) times daily.    . carvedilol (COREG) 3.125 MG tablet TAKE 1 TABLET BY MOUTH TWICE A DAY WITH A MEAL 60 tablet 3  . FARXIGA 10 MG TABS tablet TAKE 1 TABLET BY MOUTH DAILY BEFORE BREAKFAST. 90 tablet 3  . furosemide (LASIX) 20 MG tablet Take 1 tablet (20 mg total) by mouth as needed for fluid. 60 tablet 3  . sacubitril-valsartan (ENTRESTO) 24-26 MG Take 1 tablet by mouth 2 (two) times daily. 180 tablet 3  . spironolactone (ALDACTONE) 25 MG tablet TAKE 1 TABLET BY MOUTH EVERY DAY *DOSE CHANGE 90 tablet 3   No current facility-administered medications for this visit.    Allergies:   Patient has no known allergies.   Social History: Social History   Socioeconomic History  .  Marital status: Divorced    Spouse name: Not on file  . Number of children: Not on file  . Years of education: Not on file  . Highest education level: Not on file  Occupational History  . Not on file  Tobacco Use  . Smoking status: Former Smoker    Packs/day: 0.50    Years: 10.00    Pack years: 5.00    Types: Cigarettes    Quit date: 1983    Years since quitting: 39.1  . Smokeless tobacco: Former Systems developer    Types: Snuff, Sarina Ser    Quit date: 2000  Vaping Use  . Vaping Use: Never used  Substance and Sexual Activity   . Alcohol use: Not Currently  . Drug use: Never  . Sexual activity: Not Currently  Other Topics Concern  . Not on file  Social History Narrative  . Not on file   Social Determinants of Health   Financial Resource Strain: Not on file  Food Insecurity: Not on file  Transportation Needs: Not on file  Physical Activity: Not on file  Stress: Not on file  Social Connections: Not on file  Intimate Partner Violence: Not on file    Family History: Family History  Problem Relation Age of Onset  . Sudden Cardiac Death Mother     Review of Systems: All other systems reviewed and are otherwise negative except as noted above.   Physical Exam: Vitals:   08/19/20 0943  BP: 100/70  Pulse: 71  SpO2: 98%  Weight: 162 lb (73.5 kg)  Height: $Remove'4\' 11"'[id number]$  (1.499 m)     GEN- The patient is well appearing, alert and oriented x 3 today.   HEENT: normocephalic, atraumatic; sclera clear, conjunctiva pink; hearing intact; oropharynx clear; neck supple, no JVP Lymph- no cervical lymphadenopathy Lungs- Clear to ausculation bilaterally, normal work of breathing.  No wheezes, rales, rhonchi Heart- Regular rate and rhythm, no murmurs, rubs or gallops, PMI not laterally displaced GI- soft, non-tender, non-distended, bowel sounds present, no hepatosplenomegaly Extremities- no clubbing or cyanosis. No edema; DP/PT/radial pulses 2+ bilaterally MS- no significant deformity or atrophy Skin- warm and dry, no rash or lesion; ICD pocket well healed Psych- euthymic mood, full affect Neuro- strength and sensation are intact  ICD interrogation- reviewed in detail today,  See PACEART report  EKG:  EKG is ordered today. The ekg ordered today shows V paced at 71 bpm, QRS 130 ms, Downward V1, Downward Lead 1  Recent Labs: No results found for requested labs within last 8760 hours.   Wt Readings from Last 3 Encounters:  08/19/20 162 lb (73.5 kg)  07/26/20 155 lb (70.3 kg)  01/09/20 158 lb (71.7 kg)      Other studies Reviewed: Additional studies/ records that were reviewed today include: Previous EP and HF notes   Assessment and Plan:  1.  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 Haroldine Laws. Likely negative.   2. PVCs She is on amiodarone 100 mg daily for previous tele with > 20% burden. Zio 03/2018 showed rare PVCs only.  Surveillance labs today.   3. HLD Per PCP  4. Snoring Sleep study negative.  Current medicines are reviewed at length with the patient today.   The patient does not have concerns regarding her medicines.  The following changes were made  today:  none  Labs/ tests ordered today include:  Orders Placed This Encounter  Procedures  . Comp Met (CMET)  . TSH  . CBC w/Diff  . CUP PACEART INCLINIC DEVICE CHECK  . EKG 12-Lead    Disposition:   Follow up with Dr. Lovena Le  6 months  Signed, Shirley Friar, PA-C  08/19/2020 9:56 AM  [facility] [address] [place] [address] [phone] (office) [phone] (fax)

## 2020-08-19 ENCOUNTER — Encounter: Payer: Self-pay | Admitting: Student

## 2020-08-19 ENCOUNTER — Ambulatory Visit: Payer: 59 | Admitting: Student

## 2020-08-19 ENCOUNTER — Other Ambulatory Visit: Payer: Self-pay

## 2020-08-19 VITALS — BP 100/70 | HR 71 | Ht 59.0 in | Wt 162.0 lb

## 2020-08-19 DIAGNOSIS — I5032 Chronic diastolic (congestive) heart failure: Secondary | ICD-10-CM

## 2020-08-19 DIAGNOSIS — I428 Other cardiomyopathies: Secondary | ICD-10-CM

## 2020-08-19 DIAGNOSIS — R0683 Snoring: Secondary | ICD-10-CM

## 2020-08-19 DIAGNOSIS — I5022 Chronic systolic (congestive) heart failure: Secondary | ICD-10-CM | POA: Diagnosis not present

## 2020-08-19 DIAGNOSIS — I493 Ventricular premature depolarization: Secondary | ICD-10-CM

## 2020-08-19 LAB — COMPREHENSIVE METABOLIC PANEL
ALT: 17 IU/L (ref 0–32)
AST: 28 IU/L (ref 0–40)
Albumin/Globulin Ratio: 2.1 (ref 1.2–2.2)
Albumin: 4.6 g/dL (ref 3.8–4.8)
Alkaline Phosphatase: 76 IU/L (ref 44–121)
BUN/Creatinine Ratio: 13 (ref 12–28)
BUN: 20 mg/dL (ref 8–27)
Bilirubin Total: 0.5 mg/dL (ref 0.0–1.2)
CO2: 25 mmol/L (ref 20–29)
Calcium: 9.1 mg/dL (ref 8.7–10.3)
Chloride: 101 mmol/L (ref 96–106)
Creatinine, Ser: 1.52 mg/dL — ABNORMAL HIGH (ref 0.57–1.00)
GFR calc Af Amer: 40 mL/min/{1.73_m2} — ABNORMAL LOW (ref >59)
GFR calc non Af Amer: 35 mL/min/{1.73_m2} — ABNORMAL LOW (ref >59)
Globulin, Total: 2.2 g/dL (ref 1.5–4.5)
Glucose: 116 mg/dL — ABNORMAL HIGH (ref 65–99)
Potassium: 4.6 mmol/L (ref 3.5–5.2)
Sodium: 138 mmol/L (ref 134–144)
Total Protein: 6.8 g/dL (ref 6.0–8.5)

## 2020-08-19 LAB — TSH: TSH: 1.89 u[IU]/mL (ref 0.450–4.500)

## 2020-08-19 LAB — CUP PACEART INCLINIC DEVICE CHECK
Date Time Interrogation Session: 20220203095359
HighPow Impedance: 73 Ohm
Implantable Lead Implant Date: 20200316
Implantable Lead Implant Date: 20200316
Implantable Lead Implant Date: 20200316
Implantable Lead Location: 753858
Implantable Lead Location: 753859
Implantable Lead Location: 753860
Implantable Lead Model: 292
Implantable Lead Model: 4671
Implantable Lead Model: 7740
Implantable Lead Serial Number: 1013232
Implantable Lead Serial Number: 446892
Implantable Lead Serial Number: 827624
Implantable Pulse Generator Implant Date: 20200316
Lead Channel Impedance Value: 1018 Ohm
Lead Channel Impedance Value: 423 Ohm
Lead Channel Impedance Value: 772 Ohm
Lead Channel Pacing Threshold Amplitude: 0.9 V
Lead Channel Pacing Threshold Amplitude: 1.1 V
Lead Channel Pacing Threshold Amplitude: 1.6 V
Lead Channel Pacing Threshold Pulse Width: 0.4 ms
Lead Channel Pacing Threshold Pulse Width: 0.4 ms
Lead Channel Pacing Threshold Pulse Width: 1 ms
Lead Channel Sensing Intrinsic Amplitude: 6 mV
Lead Channel Setting Pacing Amplitude: 2.5 V
Lead Channel Setting Pacing Amplitude: 2.5 V
Lead Channel Setting Pacing Amplitude: 2.8 V
Lead Channel Setting Pacing Pulse Width: 0.4 ms
Lead Channel Setting Pacing Pulse Width: 1 ms
Lead Channel Setting Sensing Sensitivity: 0.5 mV
Lead Channel Setting Sensing Sensitivity: 1 mV
Pulse Gen Serial Number: 221486

## 2020-08-19 LAB — CBC WITH DIFFERENTIAL/PLATELET
Basophils Absolute: 0 10*3/uL (ref 0.0–0.2)
Basos: 1 %
EOS (ABSOLUTE): 0.1 10*3/uL (ref 0.0–0.4)
Eos: 2 %
Hematocrit: 42.7 % (ref 34.0–46.6)
Hemoglobin: 13.9 g/dL (ref 11.1–15.9)
Immature Grans (Abs): 0 10*3/uL (ref 0.0–0.1)
Immature Granulocytes: 0 %
Lymphocytes Absolute: 1.5 10*3/uL (ref 0.7–3.1)
Lymphs: 42 %
MCH: 30 pg (ref 26.6–33.0)
MCHC: 32.6 g/dL (ref 31.5–35.7)
MCV: 92 fL (ref 79–97)
Monocytes Absolute: 0.3 10*3/uL (ref 0.1–0.9)
Monocytes: 9 %
Neutrophils Absolute: 1.7 10*3/uL (ref 1.4–7.0)
Neutrophils: 46 %
Platelets: 153 10*3/uL (ref 150–450)
RBC: 4.63 x10E6/uL (ref 3.77–5.28)
RDW: 12.5 % (ref 11.7–15.4)
WBC: 3.5 10*3/uL (ref 3.4–10.8)

## 2020-08-19 NOTE — Patient Instructions (Signed)
Medication Instructions:  *If you need a refill on your cardiac medications before your next appointment, please call your pharmacy*  Lab Work: Your physician has recommended that you have lab work today: CMET, TSH, and CBC  If you have labs (blood work) drawn today and your tests are completely normal, you will receive your results only by: Marland Kitchen MyChart Message (if you have MyChart) OR . A paper copy in the mail If you have any lab test that is abnormal or we need to change your treatment, we will call you to review the results.   Follow-Up: At [facility], you and your health needs are our priority.  As part of our continuing mission to provide you with exceptional heart care, we have created designated Provider Care Teams.  These Care Teams include your primary Cardiologist (physician) and Advanced Practice Providers (APPs -  Physician Assistants and Nurse Practitioners) who all work together to provide you with the care you need, when you need it.  We recommend signing up for the patient portal called "MyChart".  Sign up information is provided on this After Visit Summary.  MyChart is used to connect with patients for Virtual Visits (Telemedicine).  Patients are able to view lab/test results, encounter notes, upcoming appointments, etc.  Non-urgent messages can be sent to your provider as well.   To learn more about what you can do with MyChart, go to [email or web address].    Your next appointment:   Your physician wants you to follow-up in: 6 MONTHS with Dr. Ladona Ridgel.  Remote monitoring is used to monitor your ICD from home. This monitoring reduces the number of office visits required to check your device to one time per year. It allows [place] to keep an eye on the functioning of your device to ensure it is working properly. You are scheduled for a device check from home on 10/05/20. You may send your transmission at any time that day. If you have a wireless device, the transmission will be  sent automatically. After your physician reviews your transmission, you will receive a postcard with your next transmission date.  The format for your next appointment:   In Person with Lewayne Bunting, MD

## 2020-08-30 ENCOUNTER — Other Ambulatory Visit: Payer: Self-pay

## 2020-08-30 MED ORDER — ATORVASTATIN CALCIUM 40 MG PO TABS: 40.0000 mg | ORAL_TABLET | Freq: Every day | ORAL | 3 refills | Status: DC

## 2020-09-14 ENCOUNTER — Telehealth: Payer: Self-pay | Admitting: Internal Medicine

## 2020-09-14 ENCOUNTER — Other Ambulatory Visit (HOSPITAL_COMMUNITY): Payer: Self-pay | Admitting: *Deleted

## 2020-09-14 MED ORDER — ENTRESTO 24-26 MG PO TABS: 1.0000 | ORAL_TABLET | Freq: Two times a day (BID) | ORAL | 3 refills | Status: DC

## 2020-09-14 NOTE — Telephone Encounter (Signed)
This is a CHF pt 

## 2020-09-14 NOTE — Telephone Encounter (Signed)
*  STAT* If patient is at the pharmacy, call can be transferred to refill team.   1. Which medications need to be refilled? (please list name of each medication and dose if known)  Antibiotic for Dental work. Patient can not remember the name    2. Which pharmacy/location (including street and city if local pharmacy) is medication to be sent to? [id number] [place], [place] - [address]  3. Do they need a 30 day or 90 day supply? 30 day supply  Patient states she needs to take 4 pills of antibiotic the day before her dental work. The doctor has written the rx in the past to last for 4 appointments ( 16 pills total). Patient does not have any upcoming dental appointments scheduled

## 2020-09-14 NOTE — Telephone Encounter (Signed)
Pt is requesting an antibiotic for dental procedure that pt is having. Please address

## 2020-09-15 NOTE — Telephone Encounter (Addendum)
Patient returned call and is aware.   Called pt no answer and vm memory full.

## 2020-09-15 NOTE — Telephone Encounter (Signed)
She does not need antibiotic prophylaxis for dental procedures based on guidelines

## 2020-09-17 ENCOUNTER — Other Ambulatory Visit (HOSPITAL_COMMUNITY): Payer: Self-pay | Admitting: Internal Medicine

## 2020-09-23 ENCOUNTER — Telehealth: Payer: Self-pay | Admitting: *Deleted

## 2020-09-23 NOTE — Telephone Encounter (Signed)
   Primary Cardiologist: [name], MD  Chart reviewed as part of pre-operative protocol coverage. Simple dental extractions are considered low risk procedures per guidelines and generally do not require any specific cardiac clearance. It is also generally accepted that for simple extractions and dental cleanings, there is no need to interrupt blood thinner therapy.   SBE prophylaxis is not required for the patient.  I will route this recommendation to the requesting party via Epic fax function and remove from pre-op pool.  Please call with questions.  Ronney Asters, NP 09/23/2020, 11:14 AM

## 2020-09-23 NOTE — Telephone Encounter (Signed)
   [facility] Pre-operative Risk Assessment    HEARTCARE STAFF: - Please ensure there is not already an duplicate clearance open for this procedure. - Under Visit Info/Reason for Call, type in Other and utilize the format Clearance MM/DD/YY or Clearance TBD. Do not use dashes or single digits. - If request is for dental extraction, please clarify the # of teeth to be extracted.  Request for surgical clearance:  1. What type of surgery is being performed? ROUTINE DENTAL CLEANING AND EXAM; I DID CONFIRM NO TEETH TO BE EXTRACTED    2. When is this surgery scheduled? 09/27/20   3. What type of clearance is required (medical clearance vs. Pharmacy clearance to hold med vs. Both)? MEDICAL  4. Are there any medications that need to be held prior to surgery and how long? NONE LISTED   5. Practice name and name of physician performing surgery? [facility]; DR. Jasmine December   6. What is the office phone number? [phone]   7.   What is the office fax number? [phone]  8.   Anesthesia type (None, local, MAC, general) ? NONE TO BE USED    Julaine Hua 09/23/2020, 10:22 AM  _________________________________________________________________   (provider comments below)

## 2020-10-05 ENCOUNTER — Ambulatory Visit (INDEPENDENT_AMBULATORY_CARE_PROVIDER_SITE_OTHER): Payer: 59

## 2020-10-05 DIAGNOSIS — I428 Other cardiomyopathies: Secondary | ICD-10-CM

## 2020-10-06 LAB — CUP PACEART REMOTE DEVICE CHECK
Battery Remaining Longevity: 90 mo
Battery Remaining Percentage: 100 %
Brady Statistic RA Percent Paced: 0 %
Brady Statistic RV Percent Paced: 100 %
Date Time Interrogation Session: 20220323141500
HighPow Impedance: 69 Ohm
Implantable Lead Implant Date: 20200316
Implantable Lead Implant Date: 20200316
Implantable Lead Implant Date: 20200316
Implantable Lead Location: 753858
Implantable Lead Location: 753859
Implantable Lead Location: 753860
Implantable Lead Model: 292
Implantable Lead Model: 4671
Implantable Lead Model: 7740
Implantable Lead Serial Number: 1013232
Implantable Lead Serial Number: 446892
Implantable Lead Serial Number: 827624
Implantable Pulse Generator Implant Date: 20200316
Lead Channel Impedance Value: 1098 Ohm
Lead Channel Impedance Value: 402 Ohm
Lead Channel Impedance Value: 744 Ohm
Lead Channel Setting Pacing Amplitude: 2.5 V
Lead Channel Setting Pacing Amplitude: 2.5 V
Lead Channel Setting Pacing Amplitude: 2.8 V
Lead Channel Setting Pacing Pulse Width: 0.4 ms
Lead Channel Setting Pacing Pulse Width: 1 ms
Lead Channel Setting Sensing Sensitivity: 0.5 mV
Lead Channel Setting Sensing Sensitivity: 1 mV
Pulse Gen Serial Number: 221486

## 2020-10-08 ENCOUNTER — Other Ambulatory Visit: Payer: Self-pay | Admitting: Medical

## 2020-10-11 ENCOUNTER — Other Ambulatory Visit: Payer: Self-pay | Admitting: Medical

## 2020-10-11 DIAGNOSIS — Z136 Encounter for screening for cardiovascular disorders: Secondary | ICD-10-CM

## 2020-10-12 NOTE — Progress Notes (Signed)
Remote ICD transmission.   

## 2020-10-23 NOTE — Progress Notes (Signed)
Advanced [facility] Failure Clinic Note   Date:  10/25/2020   ID:  Savannah Bell, DOB 1951/02/22, MRN [id number]  Location: Home  Provider location: [place] [facility] Type of Visit: Established patient  PCP:  Maudie Flakes, FNP  Cardiologist:  [name], MD Primary HF:   Chief Complaint: Heart Failure follow-up   History of Present Illness:  HPI: Savannah Bell is a 70 y.o. female with chronic systolic HF due to NICM and history of  frequent PVCs.   Admitted 5/19 with acute systolic HF. Echo EF 15%. R/LHC showed normal coronaries and normal LV pressures. cMRI showed EF 17% with no LGE. She was noted to have frequent PVC's on tele. Amio started,   We saw her in 2/20 and doing well but EF persistently 20% despite suppression of PVCs. Losartan increased and referred for ICD, CPX and sleep study.  Underwent implant of BSci CRT-D in 3/20 by Dr. Ladona Ridgel. CPX showed moderate to severe HF limitation. Home sleep study 4/20: No significant OSA. AHI 4.9  Echo 02/20/19 EF 25%  Personally reviewed  PYP 10/20 Read as strongly suggestive of TTR with H/CL of 1.65 but visually it is equivocal at worst (I think negative)  Doing well overall. Continues to work FT at [employer]. Feels great. Works in the yard. Enlarging her backyard with a cooking station. No CP or SOB. No edema, orthopnea or PND.   ICD interrogation: HL score 2  No fluid. No VT/AF. Personally reviewed   Cardiac studies:  Echo 08/22/2018: EF 20%,  Echo 03/27/18 EF 20-25% but LV less dilated  CPX 08/26/18  FVC 2.31 (109%)    FEV1 1.85 (109%)     FEV1/FVC 80 (100%)     MVV 74 (95%)    Resting HR: 72 Peak HR: 137  (89% age predicted max HR) BP rest: 98/60 BP peak: 112/60 Peak VO2: 11.7 (64% predicted peak VO2) VE/VCO2 slope: 41 OUES: 0.75 Peak RER: 1.13 VE/MVV: 46% O2pulse: 6  (75% predicted O2pulse)   Studies:  [facility] 5/29:that showed normal coronary  arteries, EF 10-20%, normal LV filling pressures(no right sided pressures recorded).   Cardiac MRI5/30/19:EF 17%, findings consistent with NICM with no evidence ofLGE to suggestinflammatory or infiltrative cardiomyopathy. who presents via Web designer for a telehealth visit today.       Past Medical History:  Diagnosis Date  . CHF (congestive heart failure) (HCC)   . History of blood transfusion    "when I had colon resection" (12/11/2017)  . Migraine    "stopped in the 1970s" (12/11/2017)  . Mitral valve prolapse    Past Surgical History:  Procedure Laterality Date  . ABDOMINAL HYSTERECTOMY  1998  . APPENDECTOMY  2003  . APPLICATION OF WOUND VAC  09/05/2011   Procedure: APPLICATION OF WOUND VAC;  Surgeon: Emelia Loron, MD;  Location: [facility];  Service: General;  Laterality: N/A;  . BIV ICD INSERTION CRT-D N/A 09/30/2018   Procedure: BIV ICD INSERTION CRT-D;  Surgeon: Marinus Maw, MD;  Location: [facility];  Service: Cardiovascular;  Laterality: N/A;  . BOWEL RESECTION  09/05/2011   Procedure: SMALL BOWEL RESECTION;  Surgeon: Emelia Loron, MD;  Location: [facility];  Service: General;  Laterality: N/A;  . BUNIONECTOMY WITH HAMMERTOE RECONSTRUCTION Bilateral    "& shortened toes"  . CESAREAN SECTION  1972  . COLON SURGERY    . LAPAROTOMY  09/05/2011   Procedure: EXPLORATORY LAPAROTOMY;  Surgeon: Molli Hazard  Dwain Sarna, MD;  Location: [facility];  Service: General;  Laterality: N/A;  . LEFT HEART CATH AND CORONARY ANGIOGRAPHY N/A 12/12/2017   Procedure: LEFT HEART CATH AND CORONARY ANGIOGRAPHY;  Surgeon: Lyn Records, MD;  Location: [facility];  Service: Cardiovascular;  Laterality: N/A;  . TUBAL LIGATION  1974     Current Outpatient Medications  Medication Sig Dispense Refill  . amiodarone (PACERONE) 100 MG tablet TAKE 1 TABLET BY MOUTH EVERY DAY 90 tablet 3  . atorvastatin (LIPITOR) 40 MG tablet Take 1 tablet (40 mg total) by mouth daily. 90  tablet 3  . calcium carbonate (CALCIUM 600) 600 MG TABS tablet Take 1,200 mg by mouth daily with breakfast.    . carvedilol (COREG) 3.125 MG tablet TAKE 1 TABLET BY MOUTH TWICE A DAY WITH A MEAL 60 tablet 3  . FARXIGA 10 MG TABS tablet TAKE 1 TABLET BY MOUTH DAILY BEFORE BREAKFAST. 90 tablet 3  . sacubitril-valsartan (ENTRESTO) 24-26 MG Take 1 tablet by mouth 2 (two) times daily. 180 tablet 3  . spironolactone (ALDACTONE) 25 MG tablet TAKE 1 TABLET BY MOUTH EVERY DAY *DOSE CHANGE 90 tablet 3   No current facility-administered medications for this encounter.    Allergies:   Patient has no known allergies.   Social History:  The patient  reports that she quit smoking about 39 years ago. Her smoking use included cigarettes. She has a 5.00 pack-year smoking history. She quit smokeless tobacco use about 22 years ago.  Her smokeless tobacco use included snuff and chew. She reports previous alcohol use. She reports that she does not use drugs.   Family History:  The patient's family history includes Sudden Cardiac Death in her mother.   ROS:  Please see the history of present illness.   All other systems are personally reviewed and negative.   Vitals:   10/25/20 1118  BP: 104/66  Pulse: 65  SpO2: 100%  Weight: 73.3 kg (161 lb 9.6 oz)    Exam:   General:  Well appearing. No resp difficulty HEENT: normal Neck: supple. no JVD. Carotids 2+ bilat; no bruits. No lymphadenopathy or thryomegaly appreciated. Cor: PMI nondisplaced. Regular rate & rhythm. No rubs, gallops or murmurs. Lungs: clear Abdomen: soft, nontender, nondistended. No hepatosplenomegaly. No bruits or masses. Good bowel sounds. Extremities: no cyanosis, clubbing, rash, edema Neuro: alert & orientedx3, cranial nerves grossly intact. moves all 4 extremities w/o difficulty. Affect pleasant   Recent Labs: 08/19/2020: ALT 17; BUN 20; Creatinine, Ser 1.52; Hemoglobin 13.9; Platelets 153; Potassium 4.6; Sodium 138; TSH 1.890   Personally reviewed   Wt Readings from Last 3 Encounters:  10/25/20 73.3 kg (161 lb 9.6 oz)  08/19/20 73.5 kg (162 lb)  07/26/20 70.3 kg (155 lb)      ASSESSMENT AND PLAN:  1. Chronic systolic HF due to NICM. Normal coronaries on [facility] 12/11/17. cMRI shows no infiltrative or inflammatory CM.No family hx of HF. No ETOH. No hx of HTN. No recent virus. She had frequent PVCs (~20%) while inpatient, which may be causing her HF. She does not snore but thinks she goes apneic. -Echo 12/12/17 EF 15% with grade 1 DD, no LV thrombus, mild MR, trivial TR, PA peak pressure 31 mmHg, and trivial pericardial effusion.  - Echo 03/27/18 EF 20-25% but LV less dilated - cMRI 5/19 EF 17% no LGE - Echo 08/22/2018 EFremains low ~20% - Echo 02/20/19 EF 25% Mildly reduced RV function - s/p BSCi CRT-D in 3/20 - CPX  2/20 BP rest: 98/60 BP peak: 112/60 Peak VO2: 11.7 (64% predicted peak VO2) VE/VCO2 slope: 41 Peak RER: 1.13 - Stable NYHA I-II symptoms despite severe LV dysfunction and CPX test 2/20 showing significant HF limitation. Will need very close f/u to assess for advanced therapies.  - Volume status looks good. Uses lasix PRN  - Continue Entresto 24/26 bid. -Continuespiro to 25 mg daily.  - Continue coreg 3.125 mg BID. Intolerant to up-titration. - Continut Farxiga 10mg  daily  - BP too low to titrate GDMT today  - Etiology of LV dysfunction remain unclear. Frequent PVCs remains only real smoking gun but we have suppressed PVCs and EF still down. Unable to get cMRI due to ICD - PYP 10/20 Read as strongly suggestive of TTR with H/CL of 1.65 but visually it is equivocal at worst (I think negative) - Missed echo today due to schedule. We will reschedule. Check labs today.   2. Frequent PVCs -~20%burdenon tele initially - Zio Patch 03/27/18 with sinus rhythm with RBBB, Rare PVCs, No high-grade arrythmias or pauses. Pt triggered events all NSR.  -Most likely cause of her NICM but EF has not improved with  PVC suppression yet - Continue amio 100 daily - Reiterated importance of annual eye exams with Amiodarone - labs today  3. Hyperlipidemia  - Continue statin therapy.  4. Snoring - Sleep study negative (AHI 4.9)   5. Anxiety - Per PCP. Stable.    Signed, 05/27/18, MD  10/25/2020 12:01 PM  [facility] [address] [facility] [place] [phone] (office) [phone] (fax)

## 2020-10-25 ENCOUNTER — Encounter (HOSPITAL_COMMUNITY): Payer: Self-pay | Admitting: Internal Medicine

## 2020-10-25 ENCOUNTER — Ambulatory Visit (HOSPITAL_COMMUNITY)
Admission: RE | Admit: 2020-10-25 | Discharge: 2020-10-25 | Disposition: A | Discharge status: Home / Self Care | Payer: 59 | Source: Ambulatory Visit | Attending: Nurse Practitioner | Admitting: Nurse Practitioner

## 2020-10-25 ENCOUNTER — Other Ambulatory Visit: Payer: Self-pay

## 2020-10-25 ENCOUNTER — Ambulatory Visit (HOSPITAL_BASED_OUTPATIENT_CLINIC_OR_DEPARTMENT_OTHER)
Admission: RE | Admit: 2020-10-25 | Discharge: 2020-10-25 | Disposition: A | Discharge status: Home / Self Care | Payer: 59 | Source: Ambulatory Visit | Attending: Internal Medicine | Admitting: Internal Medicine

## 2020-10-25 VITALS — BP 104/66 | HR 65 | Wt 161.6 lb

## 2020-10-25 DIAGNOSIS — I5022 Chronic systolic (congestive) heart failure: Secondary | ICD-10-CM

## 2020-10-25 DIAGNOSIS — I4510 Unspecified right bundle-branch block: Secondary | ICD-10-CM | POA: Diagnosis not present

## 2020-10-25 DIAGNOSIS — Z79899 Other long term (current) drug therapy: Secondary | ICD-10-CM | POA: Diagnosis not present

## 2020-10-25 DIAGNOSIS — F419 Anxiety disorder, unspecified: Secondary | ICD-10-CM | POA: Insufficient documentation

## 2020-10-25 DIAGNOSIS — I341 Nonrheumatic mitral (valve) prolapse: Secondary | ICD-10-CM | POA: Insufficient documentation

## 2020-10-25 DIAGNOSIS — Z95810 Presence of automatic (implantable) cardiac defibrillator: Secondary | ICD-10-CM | POA: Diagnosis not present

## 2020-10-25 DIAGNOSIS — R0683 Snoring: Secondary | ICD-10-CM | POA: Insufficient documentation

## 2020-10-25 DIAGNOSIS — I493 Ventricular premature depolarization: Secondary | ICD-10-CM | POA: Insufficient documentation

## 2020-10-25 DIAGNOSIS — I428 Other cardiomyopathies: Secondary | ICD-10-CM | POA: Insufficient documentation

## 2020-10-25 DIAGNOSIS — E785 Hyperlipidemia, unspecified: Secondary | ICD-10-CM | POA: Insufficient documentation

## 2020-10-25 DIAGNOSIS — Z87891 Personal history of nicotine dependence: Secondary | ICD-10-CM | POA: Insufficient documentation

## 2020-10-25 LAB — COMPREHENSIVE METABOLIC PANEL
ALT: 17 U/L (ref 0–44)
AST: 26 U/L (ref 15–41)
Albumin: 4.2 g/dL (ref 3.5–5.0)
Alkaline Phosphatase: 66 U/L (ref 38–126)
Anion gap: 8 (ref 5–15)
BUN: 23 mg/dL (ref 8–23)
CO2: 30 mmol/L (ref 22–32)
Calcium: 9.9 mg/dL (ref 8.9–10.3)
Chloride: 98 mmol/L (ref 98–111)
Creatinine, Ser: 1.53 mg/dL — ABNORMAL HIGH (ref 0.44–1.00)
GFR, Estimated: 37 mL/min — ABNORMAL LOW (ref >60)
Glucose, Bld: 98 mg/dL (ref 70–99)
Potassium: 4.2 mmol/L (ref 3.5–5.1)
Sodium: 136 mmol/L (ref 135–145)
Total Bilirubin: 0.9 mg/dL (ref 0.3–1.2)
Total Protein: 7.2 g/dL (ref 6.5–8.1)

## 2020-10-25 LAB — CBC
HCT: 44.7 % (ref 36.0–46.0)
Hemoglobin: 14.5 g/dL (ref 12.0–15.0)
MCH: 30.2 pg (ref 26.0–34.0)
MCHC: 32.4 g/dL (ref 30.0–36.0)
MCV: 93.1 fL (ref 80.0–100.0)
Platelets: 146 10*3/uL — ABNORMAL LOW (ref 150–400)
RBC: 4.8 MIL/uL (ref 3.87–5.11)
RDW: 13.3 % (ref 11.5–15.5)
WBC: 3.4 10*3/uL — ABNORMAL LOW (ref 4.0–10.5)
nRBC: 0 % (ref 0.0–0.2)

## 2020-10-25 LAB — T4, FREE: Free T4: 1.37 ng/dL — ABNORMAL HIGH (ref 0.61–1.12)

## 2020-10-25 LAB — TSH: TSH: 1.773 u[IU]/mL (ref 0.350–4.500)

## 2020-10-25 MED ORDER — POTASSIUM CHLORIDE CRYS ER 20 MEQ PO TBCR: 20.0000 meq | EXTENDED_RELEASE_TABLET | Freq: Every day | ORAL | 3 refills | Status: AC | PRN

## 2020-10-25 MED ORDER — FUROSEMIDE 20 MG PO TABS: 20.0000 mg | ORAL_TABLET | Freq: Every day | ORAL | 3 refills | Status: DC | PRN

## 2020-10-25 NOTE — Addendum Note (Signed)
Encounter addended by: Noralee Space, RN on: 10/25/2020 12:10 PM  Actions taken: Diagnosis association updated, Clinical Note Signed, Order list changed

## 2020-10-25 NOTE — Patient Instructions (Signed)
Take Furosemide 20 mg AS NEEDED  Take Potassium 20 meq ONLY when you take Furosemide  Labs done today, your results will be available in MyChart, we will contact you for abnormal readings.  Your physician has requested that you have an echocardiogram. Echocardiography is a painless test that uses sound waves to create images of your heart. It provides your doctor with information about the size and shape of your heart and how well your heart's chambers and valves are working. This procedure takes approximately one hour. There are no restrictions for this procedure.  Please call our office in 6 months to schedule your follow up appointment  If you have any questions or concerns before your next appointment please send [place] a message through [place] or call our office at [phone].    TO LEAVE A MESSAGE FOR THE NURSE SELECT OPTION 2, PLEASE LEAVE A MESSAGE INCLUDING: . YOUR NAME . DATE OF BIRTH . CALL BACK NUMBER . REASON FOR CALL**this is important as we prioritize the call backs  YOU WILL RECEIVE A CALL BACK THE SAME DAY AS LONG AS YOU CALL BEFORE 4:00 PM  At the [facility], you and your health needs are our priority. As part of our continuing mission to provide you with exceptional heart care, we have created designated Provider Care Teams. These Care Teams include your primary Cardiologist (physician) and Advanced Practice Providers (APPs- Physician Assistants and Nurse Practitioners) who all work together to provide you with the care you need, when you need it.   You may see any of the following providers on your designated Care Team at your next follow up: Marland Kitchen Dr [name] . Dr Marca Ancona . Dr Thornell Mule . Tonye Becket, NP . Robbie Lis, PA . Shanda Bumps [place],NP . Karle Plumber, PharmD   Please be sure to bring in all your medications bottles to every appointment.

## 2020-10-26 LAB — T3, FREE: T3, Free: 2.5 pg/mL (ref 2.0–4.4)

## 2020-10-28 ENCOUNTER — Ambulatory Visit: Payer: 59

## 2020-11-02 ENCOUNTER — Other Ambulatory Visit (HOSPITAL_COMMUNITY): Payer: Self-pay | Admitting: Internal Medicine

## 2020-11-29 ENCOUNTER — Ambulatory Visit (HOSPITAL_COMMUNITY)
Admission: RE | Admit: 2020-11-29 | Discharge: 2020-11-29 | Disposition: A | Discharge status: Home / Self Care | Payer: 59 | Source: Ambulatory Visit | Attending: Internal Medicine | Admitting: Internal Medicine

## 2020-11-29 ENCOUNTER — Other Ambulatory Visit: Payer: Self-pay

## 2020-11-29 DIAGNOSIS — I5022 Chronic systolic (congestive) heart failure: Secondary | ICD-10-CM | POA: Insufficient documentation

## 2020-11-29 DIAGNOSIS — Z950 Presence of cardiac pacemaker: Secondary | ICD-10-CM | POA: Insufficient documentation

## 2020-11-29 DIAGNOSIS — Z87891 Personal history of nicotine dependence: Secondary | ICD-10-CM | POA: Diagnosis not present

## 2020-11-29 DIAGNOSIS — I509 Heart failure, unspecified: Secondary | ICD-10-CM | POA: Diagnosis not present

## 2020-11-29 DIAGNOSIS — I341 Nonrheumatic mitral (valve) prolapse: Secondary | ICD-10-CM | POA: Diagnosis not present

## 2020-11-29 DIAGNOSIS — E785 Hyperlipidemia, unspecified: Secondary | ICD-10-CM | POA: Diagnosis not present

## 2020-11-29 LAB — ECHOCARDIOGRAM COMPLETE
Area-P 1/2: 3.42 cm2
S' Lateral: 5.5 cm
Single Plane A4C EF: 12.4 %

## 2020-11-29 NOTE — Progress Notes (Signed)
  Echocardiogram 2D Echocardiogram has been performed.  Pieter Partridge 11/29/2020, 8:50 AM

## 2021-01-04 ENCOUNTER — Ambulatory Visit (INDEPENDENT_AMBULATORY_CARE_PROVIDER_SITE_OTHER): Payer: 59

## 2021-01-04 DIAGNOSIS — I428 Other cardiomyopathies: Secondary | ICD-10-CM | POA: Diagnosis not present

## 2021-01-05 LAB — CUP PACEART REMOTE DEVICE CHECK
Battery Remaining Longevity: 90 mo
Battery Remaining Percentage: 100 %
Brady Statistic RA Percent Paced: 0 %
Brady Statistic RV Percent Paced: 100 %
Date Time Interrogation Session: 20220622053800
HighPow Impedance: 76 Ohm
Implantable Lead Implant Date: 20200316
Implantable Lead Implant Date: 20200316
Implantable Lead Implant Date: 20200316
Implantable Lead Location: 753858
Implantable Lead Location: 753859
Implantable Lead Location: 753860
Implantable Lead Model: 292
Implantable Lead Model: 4671
Implantable Lead Model: 7740
Implantable Lead Serial Number: 1013232
Implantable Lead Serial Number: 446892
Implantable Lead Serial Number: 827624
Implantable Pulse Generator Implant Date: 20200316
Lead Channel Impedance Value: 1124 Ohm
Lead Channel Impedance Value: 425 Ohm
Lead Channel Impedance Value: 734 Ohm
Lead Channel Setting Pacing Amplitude: 2.5 V
Lead Channel Setting Pacing Amplitude: 2.5 V
Lead Channel Setting Pacing Amplitude: 2.8 V
Lead Channel Setting Pacing Pulse Width: 0.4 ms
Lead Channel Setting Pacing Pulse Width: 1 ms
Lead Channel Setting Sensing Sensitivity: 0.5 mV
Lead Channel Setting Sensing Sensitivity: 1 mV
Pulse Gen Serial Number: 221486

## 2021-01-24 NOTE — Progress Notes (Signed)
Remote ICD transmission.   

## 2021-03-02 ENCOUNTER — Other Ambulatory Visit (HOSPITAL_COMMUNITY): Payer: Self-pay | Admitting: Internal Medicine

## 2021-04-05 ENCOUNTER — Ambulatory Visit (INDEPENDENT_AMBULATORY_CARE_PROVIDER_SITE_OTHER): Payer: 59

## 2021-04-05 DIAGNOSIS — I428 Other cardiomyopathies: Secondary | ICD-10-CM

## 2021-04-05 LAB — CUP PACEART REMOTE DEVICE CHECK
Battery Remaining Longevity: 90 mo
Battery Remaining Percentage: 100 %
Brady Statistic RA Percent Paced: 0 %
Brady Statistic RV Percent Paced: 100 %
Date Time Interrogation Session: 20220919145000
HighPow Impedance: 62 Ohm
Implantable Lead Implant Date: 20200316
Implantable Lead Implant Date: 20200316
Implantable Lead Implant Date: 20200316
Implantable Lead Location: 753858
Implantable Lead Location: 753859
Implantable Lead Location: 753860
Implantable Lead Model: 292
Implantable Lead Model: 4671
Implantable Lead Model: 7740
Implantable Lead Serial Number: 1013232
Implantable Lead Serial Number: 446892
Implantable Lead Serial Number: 827624
Implantable Pulse Generator Implant Date: 20200316
Lead Channel Impedance Value: 1068 Ohm
Lead Channel Impedance Value: 402 Ohm
Lead Channel Impedance Value: 719 Ohm
Lead Channel Setting Pacing Amplitude: 2.5 V
Lead Channel Setting Pacing Amplitude: 2.5 V
Lead Channel Setting Pacing Amplitude: 2.8 V
Lead Channel Setting Pacing Pulse Width: 0.4 ms
Lead Channel Setting Pacing Pulse Width: 1 ms
Lead Channel Setting Sensing Sensitivity: 0.5 mV
Lead Channel Setting Sensing Sensitivity: 1 mV
Pulse Gen Serial Number: 221486

## 2021-04-11 ENCOUNTER — Telehealth (HOSPITAL_COMMUNITY): Payer: Self-pay | Admitting: *Deleted

## 2021-04-11 NOTE — Telephone Encounter (Signed)
Brtitany with [facility] left vm stating pt is scheduled for som extractions Thursday and they want to know if she needs to take a pre med.   Routed to Dr.Bensimhon for advice  [phone] callback #

## 2021-04-12 NOTE — Progress Notes (Signed)
Remote ICD transmission.   

## 2021-04-15 NOTE — Telephone Encounter (Signed)
Left detailed VM. Dental office closed.

## 2021-07-05 ENCOUNTER — Ambulatory Visit (INDEPENDENT_AMBULATORY_CARE_PROVIDER_SITE_OTHER): Payer: 59

## 2021-07-05 DIAGNOSIS — I428 Other cardiomyopathies: Secondary | ICD-10-CM | POA: Diagnosis not present

## 2021-07-06 LAB — CUP PACEART REMOTE DEVICE CHECK
Battery Remaining Longevity: 72 mo
Battery Remaining Percentage: 83 %
Brady Statistic RA Percent Paced: 0 %
Brady Statistic RV Percent Paced: 100 %
Date Time Interrogation Session: 20221221120900
HighPow Impedance: 72 Ohm
Implantable Lead Implant Date: 20200316
Implantable Lead Implant Date: 20200316
Implantable Lead Implant Date: 20200316
Implantable Lead Location: 753858
Implantable Lead Location: 753859
Implantable Lead Location: 753860
Implantable Lead Model: 292
Implantable Lead Model: 4671
Implantable Lead Model: 7740
Implantable Lead Serial Number: 1013232
Implantable Lead Serial Number: 446892
Implantable Lead Serial Number: 827624
Implantable Pulse Generator Implant Date: 20200316
Lead Channel Impedance Value: 1126 Ohm
Lead Channel Impedance Value: 421 Ohm
Lead Channel Impedance Value: 739 Ohm
Lead Channel Setting Pacing Amplitude: 2.5 V
Lead Channel Setting Pacing Amplitude: 2.5 V
Lead Channel Setting Pacing Amplitude: 2.8 V
Lead Channel Setting Pacing Pulse Width: 0.4 ms
Lead Channel Setting Pacing Pulse Width: 1 ms
Lead Channel Setting Sensing Sensitivity: 0.5 mV
Lead Channel Setting Sensing Sensitivity: 1 mV
Pulse Gen Serial Number: 221486

## 2021-07-14 NOTE — Progress Notes (Signed)
Remote ICD transmission.   

## 2021-08-20 ENCOUNTER — Other Ambulatory Visit (HOSPITAL_COMMUNITY): Payer: Self-pay | Admitting: Internal Medicine

## 2021-09-07 ENCOUNTER — Other Ambulatory Visit: Payer: Self-pay | Admitting: Internal Medicine

## 2021-09-28 ENCOUNTER — Other Ambulatory Visit (HOSPITAL_COMMUNITY): Payer: Self-pay | Admitting: Internal Medicine

## 2021-10-04 ENCOUNTER — Ambulatory Visit (INDEPENDENT_AMBULATORY_CARE_PROVIDER_SITE_OTHER): Payer: 59

## 2021-10-04 DIAGNOSIS — I428 Other cardiomyopathies: Secondary | ICD-10-CM

## 2021-10-04 LAB — CUP PACEART REMOTE DEVICE CHECK
Battery Remaining Longevity: 90 mo
Battery Remaining Percentage: 98 %
Brady Statistic RA Percent Paced: 0 %
Brady Statistic RV Percent Paced: 99 %
Date Time Interrogation Session: 20230321062300
HighPow Impedance: 72 Ohm
Implantable Lead Implant Date: 20200316
Implantable Lead Implant Date: 20200316
Implantable Lead Implant Date: 20200316
Implantable Lead Location: 753858
Implantable Lead Location: 753859
Implantable Lead Location: 753860
Implantable Lead Model: 292
Implantable Lead Model: 4671
Implantable Lead Model: 7740
Implantable Lead Serial Number: 1013232
Implantable Lead Serial Number: 446892
Implantable Lead Serial Number: 827624
Implantable Pulse Generator Implant Date: 20200316
Lead Channel Impedance Value: 1199 Ohm
Lead Channel Impedance Value: 410 Ohm
Lead Channel Impedance Value: 747 Ohm
Lead Channel Setting Pacing Amplitude: 2.5 V
Lead Channel Setting Pacing Amplitude: 2.5 V
Lead Channel Setting Pacing Amplitude: 2.8 V
Lead Channel Setting Pacing Pulse Width: 0.4 ms
Lead Channel Setting Pacing Pulse Width: 1 ms
Lead Channel Setting Sensing Sensitivity: 0.5 mV
Lead Channel Setting Sensing Sensitivity: 1 mV
Pulse Gen Serial Number: 221486

## 2021-10-14 ENCOUNTER — Other Ambulatory Visit (HOSPITAL_COMMUNITY): Payer: Self-pay | Admitting: Internal Medicine

## 2021-10-14 ENCOUNTER — Other Ambulatory Visit: Payer: Self-pay | Admitting: Internal Medicine

## 2021-10-18 NOTE — Progress Notes (Signed)
Remote ICD transmission.   

## 2021-10-20 ENCOUNTER — Ambulatory Visit (HOSPITAL_COMMUNITY)
Admission: RE | Admit: 2021-10-20 | Discharge: 2021-10-20 | Disposition: A | Discharge status: Home / Self Care | Payer: 59 | Source: Ambulatory Visit | Attending: Physician Assistant | Admitting: Physician Assistant

## 2021-10-20 ENCOUNTER — Encounter (HOSPITAL_COMMUNITY): Payer: Self-pay

## 2021-10-20 VITALS — BP 100/70 | HR 71 | Wt 166.8 lb

## 2021-10-20 DIAGNOSIS — I428 Other cardiomyopathies: Secondary | ICD-10-CM | POA: Diagnosis not present

## 2021-10-20 DIAGNOSIS — F419 Anxiety disorder, unspecified: Secondary | ICD-10-CM | POA: Diagnosis not present

## 2021-10-20 DIAGNOSIS — E782 Mixed hyperlipidemia: Secondary | ICD-10-CM

## 2021-10-20 DIAGNOSIS — E785 Hyperlipidemia, unspecified: Secondary | ICD-10-CM | POA: Insufficient documentation

## 2021-10-20 DIAGNOSIS — Z87891 Personal history of nicotine dependence: Secondary | ICD-10-CM | POA: Insufficient documentation

## 2021-10-20 DIAGNOSIS — Z79899 Other long term (current) drug therapy: Secondary | ICD-10-CM | POA: Insufficient documentation

## 2021-10-20 DIAGNOSIS — I5023 Acute on chronic systolic (congestive) heart failure: Secondary | ICD-10-CM | POA: Insufficient documentation

## 2021-10-20 DIAGNOSIS — R0683 Snoring: Secondary | ICD-10-CM | POA: Insufficient documentation

## 2021-10-20 DIAGNOSIS — I493 Ventricular premature depolarization: Secondary | ICD-10-CM | POA: Diagnosis not present

## 2021-10-20 DIAGNOSIS — I5022 Chronic systolic (congestive) heart failure: Secondary | ICD-10-CM

## 2021-10-20 DIAGNOSIS — I4510 Unspecified right bundle-branch block: Secondary | ICD-10-CM | POA: Diagnosis not present

## 2021-10-20 DIAGNOSIS — Z9049 Acquired absence of other specified parts of digestive tract: Secondary | ICD-10-CM | POA: Insufficient documentation

## 2021-10-20 LAB — T4, FREE: Free T4: 1.3 ng/dL — ABNORMAL HIGH (ref 0.61–1.12)

## 2021-10-20 LAB — CBC
HCT: 43 % (ref 36.0–46.0)
Hemoglobin: 13.8 g/dL (ref 12.0–15.0)
MCH: 29.6 pg (ref 26.0–34.0)
MCHC: 32.1 g/dL (ref 30.0–36.0)
MCV: 92.3 fL (ref 80.0–100.0)
Platelets: 135 10*3/uL — ABNORMAL LOW (ref 150–400)
RBC: 4.66 MIL/uL (ref 3.87–5.11)
RDW: 13.4 % (ref 11.5–15.5)
WBC: 3.1 10*3/uL — ABNORMAL LOW (ref 4.0–10.5)
nRBC: 0 % (ref 0.0–0.2)

## 2021-10-20 LAB — COMPREHENSIVE METABOLIC PANEL
ALT: 17 U/L (ref 0–44)
AST: 23 U/L (ref 15–41)
Albumin: 4.1 g/dL (ref 3.5–5.0)
Alkaline Phosphatase: 63 U/L (ref 38–126)
Anion gap: 5 (ref 5–15)
BUN: 10 mg/dL (ref 8–23)
CO2: 27 mmol/L (ref 22–32)
Calcium: 9.6 mg/dL (ref 8.9–10.3)
Chloride: 104 mmol/L (ref 98–111)
Creatinine, Ser: 1.44 mg/dL — ABNORMAL HIGH (ref 0.44–1.00)
GFR, Estimated: 39 mL/min — ABNORMAL LOW (ref >60)
Glucose, Bld: 109 mg/dL — ABNORMAL HIGH (ref 70–99)
Potassium: 4.6 mmol/L (ref 3.5–5.1)
Sodium: 136 mmol/L (ref 135–145)
Total Bilirubin: 0.5 mg/dL (ref 0.3–1.2)
Total Protein: 6.9 g/dL (ref 6.5–8.1)

## 2021-10-20 LAB — BRAIN NATRIURETIC PEPTIDE: B Natriuretic Peptide: 297.2 pg/mL — ABNORMAL HIGH (ref 0.0–100.0)

## 2021-10-20 LAB — TSH: TSH: 0.677 u[IU]/mL (ref 0.350–4.500)

## 2021-10-20 NOTE — Patient Instructions (Signed)
EKG done today. ? ?Labs done today. We will contact you only if your labs are abnormal. ? ?No medication changes were made. Please continue all current medications as prescribed. ? ?Your physician recommends that you schedule a follow-up appointment in: 6 months with Dr. [name]. Please contact our office in August to schedule a September appointment.  ? ?If you have any questions or concerns before your next appointment please send [place] a message through [place] or call our office at [phone].   ? ?TO LEAVE A MESSAGE FOR THE NURSE SELECT OPTION 2, PLEASE LEAVE A MESSAGE INCLUDING: ?YOUR NAME ?DATE OF BIRTH ?CALL BACK NUMBER ?REASON FOR CALL**this is important as we prioritize the call backs ? ?YOU WILL RECEIVE A CALL BACK THE SAME DAY AS LONG AS YOU CALL BEFORE 4:00 PM ? ? ?Do the following things EVERYDAY: ?Weigh yourself in the morning before breakfast. Write it down and keep it in a log. ?Take your medicines as prescribed ?Eat low salt foods--Limit salt (sodium) to 2000 mg per day.  ?Stay as active as you can everyday ?Limit all fluids for the day to less than 2 liters ? ? ?At the [facility], you and your health needs are our priority. As part of our continuing mission to provide you with exceptional heart care, we have created designated Provider Care Teams. These Care Teams include your primary Cardiologist (physician) and Advanced Practice Providers (APPs- Physician Assistants and Nurse Practitioners) who all work together to provide you with the care you need, when you need it.  ? ?You may see any of the following providers on your designated Care Team at your next follow up: ?Dr [name] ?Dr Marca Ancona ?Tonye Becket, NP ?Robbie Lis, PA ?Karle Plumber, PharmD ? ? ?Please be sure to bring in all your medications bottles to every appointment.  ? ?

## 2021-10-20 NOTE — Progress Notes (Signed)
?  ? ?Advanced Heart Failure Clinic Note ? ? ?Date:  10/20/2021  ? ?ID:  Savannah Bell, DOB Nov 20, 1950, MRN [id number]  Location: Home  ?Provider location: [place] [facility] ?Type of Visit: Established patient ? ?PCP:  Maudie Flakes, FNP  ?Cardiologist:  [name], MD ?Primary HF: Bensimhon ? ?Chief Complaint: Heart Failure follow-up ?  ?History of Present Illness: ? ?HPI: Savannah Bell is a 71 y.o. female with chronic systolic HF due to NICM and history of  frequent PVCs.  ?  ?Admitted 5/19 with acute systolic HF. Echo EF 15%. R/LHC showed normal coronaries and normal LV pressures. cMRI showed EF 17% with no LGE. She was noted to have frequent PVC's on tele. Amio started,  ? ?We saw her in 2/20 and doing well but EF persistently 20% despite suppression of PVCs. Losartan increased and referred for ICD, CPX and sleep study. ? ?Underwent implant of BSci CRT-D in 3/20 by Dr. Ladona Ridgel. CPX showed moderate to severe HF limitation. Home sleep study 4/20: No significant OSA. AHI 4.9 ? ?Echo 02/20/19 EF 25%  Personally reviewed ? ?PYP 10/20 Read as strongly suggestive of TTR with H/CL of 1.65 but visually it is equivocal at worst (I think negative) ? ?Echo 05/22: EF 20-25%, RV okay, mild MR ? ?She is here today for annual follow-up. No dyspnea, orthopnea, PND or lower extremity edema. She is not reporting any palpitations or dizziness. Taking all medications as prescribed. Continues to work at the [employer] and walks quite a bit during the day. Also keeps active working in her yard. ? ?ICD interrogation: HLI 3, No VT or AF, activity level 2.9 hrs/day.  ? ? ?Cardiac studies: ? ?Echo 05/22: EF 20-25%, RV okay ?Echo 08/22/2018: EF 20%,  ?Echo 03/27/18 EF 20-25% but LV less dilated ? ?CPX 08/26/18 ? ?FVC 2.31 (109%)      ?FEV1 1.85 (109%)        ?FEV1/FVC 80 (100%)        ?MVV 74 (95%) ?      ?Resting HR: 72 Peak HR: 137   (89% age predicted max HR) ?BP rest: 98/60 BP peak: 112/60 ?Peak VO2:  11.7 (64% predicted peak VO2) ?VE/VCO2 slope:  41 ?OUES: 0.75 ?Peak RER: 1.13 ?VE/MVV:  46% ?O2pulse:  6   (75% predicted O2pulse) ?  ?Studies: ?  ?R/LHC 5/29: that showed normal coronary arteries, EF 10-20%, normal LV filling pressures (no right sided pressures recorded).  ?  ?Cardiac MRI 12/13/17: EF 17%, findings consistent with NICM with no evidence of LGE to suggest inflammatory or infiltrative cardiomyopathy.  ?who presents via audio/video conferencing for a telehealth visit today.    ? ? ? ?Past Medical History:  ?Diagnosis Date  ? CHF (congestive heart failure) (HCC)   ? History of blood transfusion   ? "when I had colon resection" (12/11/2017)  ? Migraine   ? "stopped in the 1970s" (12/11/2017)  ? Mitral valve prolapse   ? ?Past Surgical History:  ?Procedure Laterality Date  ? ABDOMINAL HYSTERECTOMY  1998  ? APPENDECTOMY  2003  ? APPLICATION OF WOUND VAC  09/05/2011  ? Procedure: APPLICATION OF WOUND VAC;  Surgeon: Emelia Loron, MD;  Location: [facility];  Service: General;  Laterality: N/A;  ? BIV ICD INSERTION CRT-D N/A 09/30/2018  ? Procedure: BIV ICD INSERTION CRT-D;  Surgeon: Marinus Maw, MD;  Location: [facility] INVASIVE CV LAB;  Service: Cardiovascular;  Laterality: N/A;  ? BOWEL RESECTION  09/05/2011  ?  Procedure: SMALL BOWEL RESECTION;  Surgeon: Emelia Loron, MD;  Location: [facility];  Service: General;  Laterality: N/A;  ? BUNIONECTOMY WITH HAMMERTOE RECONSTRUCTION Bilateral   ? "& shortened toes"  ? CESAREAN SECTION  1972  ? COLON SURGERY    ? LAPAROTOMY  09/05/2011  ? Procedure: EXPLORATORY LAPAROTOMY;  Surgeon: Emelia Loron, MD;  Location: [facility];  Service: General;  Laterality: N/A;  ? LEFT HEART CATH AND CORONARY ANGIOGRAPHY N/A 12/12/2017  ? Procedure: LEFT HEART CATH AND CORONARY ANGIOGRAPHY;  Surgeon: Lyn Records, MD;  Location: [facility] INVASIVE CV LAB;  Service: Cardiovascular;  Laterality: N/A;  ? TUBAL LIGATION  1974  ? ? ? ?Current Outpatient Medications  ?Medication Sig Dispense Refill  ?  amiodarone (PACERONE) 100 MG tablet TAKE 1 TABLET BY MOUTH EVERY DAY 90 tablet 3  ? atorvastatin (LIPITOR) 40 MG tablet TAKE 1 TABLET BY MOUTH DAILY. PLEASE SCHEDULE YEARLY APPOINTMENT FOR FUTURE REFILLS. THANK YOU 90 tablet 0  ? calcium carbonate (OS-CAL) 600 MG TABS tablet Take 1,200 mg by mouth daily with breakfast.    ? carvedilol (COREG) 3.125 MG tablet TAKE 1 TABLET BY MOUTH TWICE A DAY WITH MEALS 180 tablet 3  ? dapagliflozin propanediol (FARXIGA) 10 MG TABS tablet Take 1 tablet (10 mg total) by mouth daily. NEEDS FOLLOW UP FOR ANYMORE REFILLS 30 tablet 0  ? furosemide (LASIX) 20 MG tablet Take 1 tablet (20 mg total) by mouth daily as needed. 10 tablet 3  ? potassium chloride SA (KLOR-CON) 20 MEQ tablet Take 1 tablet (20 mEq total) by mouth daily as needed (when you take Furosemide). 10 tablet 3  ? sacubitril-valsartan (ENTRESTO) 24-26 MG Take 1 tablet by mouth 2 (two) times daily. NEEDS FOLLOW UP FOR ANYMORE REFILLS 60 tablet 0  ? spironolactone (ALDACTONE) 25 MG tablet Take 1 tablet (25 mg total) by mouth daily. 90 tablet 3  ? ?No current facility-administered medications for this encounter.  ? ? ?Allergies:   Patient has no known allergies.  ? ?Social History:  The patient  reports that she quit smoking about 40 years ago. Her smoking use included cigarettes. She has a 5.00 pack-year smoking history. She quit smokeless tobacco use about 23 years ago.  Her smokeless tobacco use included snuff and chew. She reports that she does not currently use alcohol. She reports that she does not use drugs.  ? ?Family History:  The patient's family history includes Sudden Cardiac Death in her mother.  ? ?ROS:  Please see the history of present illness.   All other systems are personally reviewed and negative.  ? ?Vitals:  ? 10/20/21 1522  ?BP: 100/70  ?Pulse: 71  ?SpO2: 98%  ?Weight: 75.7 kg (166 lb 12.8 oz)  ? ? ?Exam:   ?General:  Well appearing. Ambulated into clinic. ?HEENT: normal ?Neck: supple. no JVD. Carotids 2+  bilat; no bruits.  ?Cor: PMI nondisplaced. Regular rate & rhythm. No rubs, gallops or murmurs. ?Lungs: clear ?Abdomen: soft, nontender, nondistended. No hepatosplenomegaly.  ?Extremities: no cyanosis, clubbing, rash, edema ?Neuro: alert & orientedx3, cranial nerves grossly intact. moves all 4 extremities w/o difficulty. Affect pleasant ? ? ? ?Recent Labs: ?10/25/2020: ALT 17; BUN 23; Creatinine, Ser 1.53; Hemoglobin 14.5; Platelets 146; Potassium 4.2; Sodium 136; TSH 1.773  ?Personally reviewed  ? ?Wt Readings from Last 3 Encounters:  ?10/20/21 75.7 kg (166 lb 12.8 oz)  ?10/25/20 73.3 kg (161 lb 9.6 oz)  ?08/19/20 73.5 kg (162 lb)  ?  ? ? ?ASSESSMENT  AND PLAN: ? ?1. Chronic systolic HF due to NICM.  ?-Normal coronaries on [facility] 12/11/17. cMRI shows no infiltrative or inflammatory CM. No family hx of HF. No ETOH. No hx of HTN. No recent virus. Frequent PVCs remains only real smoking gun but we have suppressed PVCs and EF still down. Unable to get cMRI due to ICD ?- Echo 12/12/17 EF 15% with grade 1 DD, no LV thrombus, mild MR, trivial TR, PA peak pressure 31 mmHg, and trivial pericardial effusion.  ?- Echo 03/27/18 EF 20-25% but LV less dilated ?- cMRI 5/19 EF 17% no LGE ?- Echo 08/22/2018 EF remains low ~20% ?- s/p BSCi CRT-D in 3/20 ?- Echo 02/20/19 EF 25% Mildly reduced RV function ?- PYP 10/20 Read as strongly suggestive of TTR with H/CL of 1.65 but visually it is equivocal at worst (I think negative) ?- CPX 2/20 BP rest: 98/60 BP peak: 112/60 Peak VO2: 11.7 (64% predicted peak VO2) VE/VCO2 slope: 41 Peak RER: 1.13 ?- Echo 05/22: EF 20-25%, RV okay ?- Stable NYHA I-II symptoms despite severe LV dysfunction and CPX test 2/20 showing significant HF limitation. Will need very close f/u to assess for advanced therapies.  ?- Volume status looks good. Not requiring regular diuretic. Has lasix to use prn. ?- Continue Entresto 24/26 bid. ?- Continue spiro to 25 mg daily.  ?- Continue coreg 3.125 mg BID. Intolerant to  up-titration. ?- Continue Farxiga 10mg  daily  ?- BP too low to titrate GDMT further ?- CMET, CBC, BNP today ?   ?2. Frequent PVCs ?- ~20% burden on tele initially ?- Zio Patch 03/27/18 with sinus rhythm with RBBB,

## 2021-10-24 ENCOUNTER — Telehealth (HOSPITAL_COMMUNITY): Payer: Self-pay | Admitting: Licensed Clinical Social Worker

## 2021-10-24 NOTE — Progress Notes (Signed)
CSW contacted patient to follow up on appointment from last week. CSW unable to meet with patient while in the clinic to complete an SDoH screen so contacted patient by phone. CSW asked a questions related to [facility] and patient states she drives and has a car but inquired about what options might be available to her if she could not get to the appointment. CSW discussed options for transportation from insurance benefit, bus pass and taxi voucher. CSW continued and patient denied any other SDoH at this time but inquired about what resources would be available. CSW briefly discussed some community resources and explained that if patient has a specific need that resources could be offered pending the need. Patient asked about her antibiotic for her upcoming dental appointment. CSW spoke with Clinic Coordinator who confirmed that provider stated at appointment patient does not need antibiotic and that her dental office had been notified that she does not need an antibiotic prior to dental visit. Patient verbalizes understanding. CSW available if needed. Savannah Bell, [name], [name] ? ?

## 2021-10-26 ENCOUNTER — Other Ambulatory Visit (HOSPITAL_COMMUNITY): Payer: Self-pay | Admitting: Internal Medicine

## 2021-10-29 ENCOUNTER — Other Ambulatory Visit (HOSPITAL_COMMUNITY): Payer: Self-pay | Admitting: Internal Medicine

## 2021-11-26 ENCOUNTER — Other Ambulatory Visit (HOSPITAL_COMMUNITY): Payer: Self-pay | Admitting: Internal Medicine

## 2022-01-03 ENCOUNTER — Ambulatory Visit (INDEPENDENT_AMBULATORY_CARE_PROVIDER_SITE_OTHER): Payer: 59

## 2022-01-03 DIAGNOSIS — I428 Other cardiomyopathies: Secondary | ICD-10-CM | POA: Diagnosis not present

## 2022-01-03 LAB — CUP PACEART REMOTE DEVICE CHECK
Battery Remaining Longevity: 84 mo
Battery Remaining Percentage: 96 %
Brady Statistic RA Percent Paced: 0 %
Brady Statistic RV Percent Paced: 99 %
Date Time Interrogation Session: 20230620063500
HighPow Impedance: 71 Ohm
Implantable Lead Implant Date: 20200316
Implantable Lead Implant Date: 20200316
Implantable Lead Implant Date: 20200316
Implantable Lead Location: 753858
Implantable Lead Location: 753859
Implantable Lead Location: 753860
Implantable Lead Model: 292
Implantable Lead Model: 4671
Implantable Lead Model: 7740
Implantable Lead Serial Number: 1013232
Implantable Lead Serial Number: 446892
Implantable Lead Serial Number: 827624
Implantable Pulse Generator Implant Date: 20200316
Lead Channel Impedance Value: 1277 Ohm
Lead Channel Impedance Value: 393 Ohm
Lead Channel Impedance Value: 762 Ohm
Lead Channel Setting Pacing Amplitude: 2.5 V
Lead Channel Setting Pacing Amplitude: 2.5 V
Lead Channel Setting Pacing Amplitude: 2.8 V
Lead Channel Setting Pacing Pulse Width: 0.4 ms
Lead Channel Setting Pacing Pulse Width: 1 ms
Lead Channel Setting Sensing Sensitivity: 0.5 mV
Lead Channel Setting Sensing Sensitivity: 1 mV
Pulse Gen Serial Number: 221486

## 2022-01-18 NOTE — Progress Notes (Signed)
Remote ICD transmission.   

## 2022-01-25 ENCOUNTER — Other Ambulatory Visit: Payer: Self-pay | Admitting: Internal Medicine

## 2022-04-04 ENCOUNTER — Ambulatory Visit (INDEPENDENT_AMBULATORY_CARE_PROVIDER_SITE_OTHER): Payer: 59

## 2022-04-04 DIAGNOSIS — I428 Other cardiomyopathies: Secondary | ICD-10-CM

## 2022-04-04 LAB — CUP PACEART REMOTE DEVICE CHECK
Battery Remaining Longevity: 84 mo
Battery Remaining Percentage: 94 %
Brady Statistic RA Percent Paced: 0 %
Brady Statistic RV Percent Paced: 99 %
Date Time Interrogation Session: 20230919062400
HighPow Impedance: 85 Ohm
Implantable Lead Implant Date: 20200316
Implantable Lead Implant Date: 20200316
Implantable Lead Implant Date: 20200316
Implantable Lead Location: 753858
Implantable Lead Location: 753859
Implantable Lead Location: 753860
Implantable Lead Model: 292
Implantable Lead Model: 4671
Implantable Lead Model: 7740
Implantable Lead Serial Number: 1013232
Implantable Lead Serial Number: 446892
Implantable Lead Serial Number: 827624
Implantable Pulse Generator Implant Date: 20200316
Lead Channel Impedance Value: 1291 Ohm
Lead Channel Impedance Value: 420 Ohm
Lead Channel Impedance Value: 769 Ohm
Lead Channel Setting Pacing Amplitude: 2.5 V
Lead Channel Setting Pacing Amplitude: 2.5 V
Lead Channel Setting Pacing Amplitude: 2.8 V
Lead Channel Setting Pacing Pulse Width: 0.4 ms
Lead Channel Setting Pacing Pulse Width: 1 ms
Lead Channel Setting Sensing Sensitivity: 0.5 mV
Lead Channel Setting Sensing Sensitivity: 1 mV
Pulse Gen Serial Number: 221486

## 2022-04-04 LAB — COLOGUARD: COLOGUARD: NEGATIVE

## 2022-04-06 ENCOUNTER — Other Ambulatory Visit (HOSPITAL_COMMUNITY): Payer: Self-pay | Admitting: Internal Medicine

## 2022-04-13 NOTE — Progress Notes (Signed)
Remote pacemaker transmission.   

## 2022-05-01 ENCOUNTER — Ambulatory Visit (HOSPITAL_COMMUNITY)
Admission: RE | Admit: 2022-05-01 | Discharge: 2022-05-01 | Disposition: A | Discharge status: Home / Self Care | Payer: 59 | Source: Ambulatory Visit | Attending: Internal Medicine | Admitting: Internal Medicine

## 2022-05-01 VITALS — BP 110/70 | HR 78 | Wt 167.8 lb

## 2022-05-01 DIAGNOSIS — I493 Ventricular premature depolarization: Secondary | ICD-10-CM

## 2022-05-01 DIAGNOSIS — E785 Hyperlipidemia, unspecified: Secondary | ICD-10-CM | POA: Insufficient documentation

## 2022-05-01 DIAGNOSIS — Z95810 Presence of automatic (implantable) cardiac defibrillator: Secondary | ICD-10-CM | POA: Diagnosis not present

## 2022-05-01 DIAGNOSIS — Z7984 Long term (current) use of oral hypoglycemic drugs: Secondary | ICD-10-CM | POA: Insufficient documentation

## 2022-05-01 DIAGNOSIS — I428 Other cardiomyopathies: Secondary | ICD-10-CM | POA: Insufficient documentation

## 2022-05-01 DIAGNOSIS — Z7901 Long term (current) use of anticoagulants: Secondary | ICD-10-CM | POA: Diagnosis not present

## 2022-05-01 DIAGNOSIS — Z79899 Other long term (current) drug therapy: Secondary | ICD-10-CM | POA: Diagnosis not present

## 2022-05-01 DIAGNOSIS — I4510 Unspecified right bundle-branch block: Secondary | ICD-10-CM | POA: Diagnosis not present

## 2022-05-01 DIAGNOSIS — E782 Mixed hyperlipidemia: Secondary | ICD-10-CM | POA: Diagnosis not present

## 2022-05-01 DIAGNOSIS — I5022 Chronic systolic (congestive) heart failure: Secondary | ICD-10-CM

## 2022-05-01 LAB — COMPREHENSIVE METABOLIC PANEL
ALT: 16 U/L (ref 0–44)
AST: 25 U/L (ref 15–41)
Albumin: 4.1 g/dL (ref 3.5–5.0)
Alkaline Phosphatase: 66 U/L (ref 38–126)
Anion gap: 10 (ref 5–15)
BUN: 20 mg/dL (ref 8–23)
CO2: 25 mmol/L (ref 22–32)
Calcium: 9.4 mg/dL (ref 8.9–10.3)
Chloride: 104 mmol/L (ref 98–111)
Creatinine, Ser: 1.46 mg/dL — ABNORMAL HIGH (ref 0.44–1.00)
GFR, Estimated: 38 mL/min — ABNORMAL LOW (ref >60)
Glucose, Bld: 105 mg/dL — ABNORMAL HIGH (ref 70–99)
Potassium: 4.3 mmol/L (ref 3.5–5.1)
Sodium: 139 mmol/L (ref 135–145)
Total Bilirubin: 0.6 mg/dL (ref 0.3–1.2)
Total Protein: 7.1 g/dL (ref 6.5–8.1)

## 2022-05-01 LAB — TSH: TSH: 1.829 u[IU]/mL (ref 0.350–4.500)

## 2022-05-01 LAB — CBC
HCT: 42.3 % (ref 36.0–46.0)
Hemoglobin: 13.8 g/dL (ref 12.0–15.0)
MCH: 29.8 pg (ref 26.0–34.0)
MCHC: 32.6 g/dL (ref 30.0–36.0)
MCV: 91.4 fL (ref 80.0–100.0)
Platelets: 155 10*3/uL (ref 150–400)
RBC: 4.63 MIL/uL (ref 3.87–5.11)
RDW: 14 % (ref 11.5–15.5)
WBC: 3.6 10*3/uL — ABNORMAL LOW (ref 4.0–10.5)
nRBC: 0 % (ref 0.0–0.2)

## 2022-05-01 LAB — T4, FREE: Free T4: 1.18 ng/dL — ABNORMAL HIGH (ref 0.61–1.12)

## 2022-05-01 LAB — BRAIN NATRIURETIC PEPTIDE: B Natriuretic Peptide: 391.9 pg/mL — ABNORMAL HIGH (ref 0.0–100.0)

## 2022-05-01 NOTE — Addendum Note (Signed)
Encounter addended by: Scarlette Calico, RN on: 05/01/2022 10:22 AM  Actions taken: Order list changed, Diagnosis association updated, Clinical Note Signed, Charge Capture section accepted

## 2022-05-01 NOTE — Patient Instructions (Signed)
Medication Changes:  None, continue current medications  Lab Work:  Labs done today, your results will be available in MyChart, we will contact you for abnormal readings.  Testing/Procedures:  Your physician has requested that you have an echocardiogram. Echocardiography is a painless test that uses sound waves to create images of your heart. It provides your doctor with information about the size and shape of your heart and how well your heart's chambers and valves are working. This procedure takes approximately one hour. There are no restrictions for this procedure. Please do NOT wear cologne, perfume, aftershave, or lotions (deodorant is allowed). Please arrive 15 minutes prior to your appointment time.   Referrals:  none  Special Instructions // Education:  Do the following things EVERYDAY: Weigh yourself in the morning before breakfast. Write it down and keep it in a log. Take your medicines as prescribed Eat low salt foods--Limit salt (sodium) to 2000 mg per day.  Stay as active as you can everyday Limit all fluids for the day to less than 2 liters   Follow-Up in: 6 months (April 2024), **PLEASE CALL OUR OFFICE IN FEBRUARY TO SCHEDULE THIS APPOINTMENT   At the [facility], you and your health needs are our priority. We have a designated team specialized in the treatment of Heart Failure. This Care Team includes your primary Heart Failure Specialized Cardiologist (physician), Advanced Practice Providers (APPs- Physician Assistants and Nurse Practitioners), and Pharmacist who all work together to provide you with the care you need, when you need it.   You may see any of the following providers on your designated Care Team at your next follow up:  Dr. Glori Bickers Dr. Loralie Champagne Dr. Roxana Hires, NP Lyda Jester, [place] [facility], [place] Forestine Na, NP Audry Riles, PharmD   Please be sure to bring in all your  medications bottles to every appointment.   Need to Contact us:  If you have any questions or concerns before your next appointment please send [place] a message through [place] or call our office at [phone].    TO LEAVE A MESSAGE FOR THE NURSE SELECT OPTION 2, PLEASE LEAVE A MESSAGE INCLUDING: YOUR NAME DATE OF BIRTH CALL BACK NUMBER REASON FOR CALL**this is important as we prioritize the call backs  YOU WILL RECEIVE A CALL BACK THE SAME DAY AS LONG AS YOU CALL BEFORE 4:00 PM

## 2022-05-01 NOTE — Addendum Note (Signed)
Encounter addended by: Jolaine Artist, MD on: 05/01/2022 10:05 AM  Actions taken: Clinical Note Signed

## 2022-05-01 NOTE — Progress Notes (Addendum)
[facility] Note   Date:  05/01/2022   ID:  Savannah Bell, DOB [birth year], MRN [id number]  Location: Home  Provider location: [facility] Type of Visit: Established patient  PCP:  Maudie Flakes, FNP  Cardiologist:  [name], MD Primary HF:   Chief Complaint: Heart Failure follow-up   History of Present Illness:  HPI: Savannah Bell is a 71 y.o. female with chronic systolic HF due to NICM and history of  frequent PVCs.    Admitted 5/19 with acute systolic HF. Echo EF 15%. R/LHC showed normal coronaries and normal LV pressures. cMRI showed EF 17% with no LGE. She was noted to have frequent PVC's on tele. Amio started,   We saw her in 2/20 and doing well but EF persistently 20% despite suppression of PVCs. Losartan increased and referred for ICD, CPX and sleep study.  Underwent implant of BSci CRT-D in 3/20 by Dr. Ladona Ridgel. CPX showed moderate to severe HF limitation. Home sleep study 4/20: No significant OSA. AHI 4.9  Echo 02/20/19 EF 25%  Personally reviewed  PYP 10/20 Read as strongly suggestive of TTR with H/CL of 1.65 but visually it is equivocal at worst (I think negative)  Echo 05/22: EF 20-25%, RV okay, mild MR  She is here today for annual follow-up. Feels good. Still working at [employer]. No SOB, orthopnea or PND. No edema. Has not needed lasix.    ICD interrogation: HL score 0, No VT/AF. Activity 2.3 hr/day Personally reviewed    Cardiac studies:  Echo 05/22: EF 20-25%, RV okay Echo 08/22/2018: EF 20%,  Echo 03/27/18 EF 20-25% but LV less dilated  CPX 08/26/18  FVC 2.31 (109%)      FEV1 1.85 (109%)        FEV1/FVC 80 (100%)        MVV 74 (95%)       Resting HR: 72 Peak HR: 137   (89% age predicted max HR) BP rest: 98/60 BP peak: 112/60 Peak VO2: 11.7 (64% predicted peak VO2) VE/VCO2 slope:  41 OUES: 0.75 Peak RER: 1.13 VE/MVV:  46% O2pulse:  6   (75% predicted O2pulse)    Studies:   [facility] 5/29: that showed normal coronary arteries, EF 10-20%, normal LV filling pressures (no right sided pressures recorded).    Cardiac MRI 12/13/17: EF 17%, findings consistent with NICM with no evidence of LGE to suggest inflammatory or infiltrative cardiomyopathy.  who presents via Web designer for a telehealth visit today.       Past Medical History:  Diagnosis Date   CHF (congestive heart failure) (HCC)    History of blood transfusion    "when I had colon resection" (12/11/2017)   Migraine    "stopped in the 1970s" (12/11/2017)   Mitral valve prolapse    Past Surgical History:  Procedure Laterality Date   ABDOMINAL HYSTERECTOMY  1998   APPENDECTOMY  2003   APPLICATION OF WOUND VAC  09/05/2011   Procedure: APPLICATION OF WOUND VAC;  Surgeon: Emelia Loron, MD;  Location: [facility];  Service: General;  Laterality: N/A;   BIV ICD INSERTION CRT-D N/A 09/30/2018   Procedure: BIV ICD INSERTION CRT-D;  Surgeon: Marinus Maw, MD;  [facility];  Service: Cardiovascular;  Laterality: N/A;   BOWEL RESECTION  09/05/2011   Procedure: SMALL BOWEL RESECTION;  Surgeon: Emelia Loron, MD;  Location: [facility];  Service: General;  Laterality: N/A;  BUNIONECTOMY WITH HAMMERTOE RECONSTRUCTION Bilateral    "& shortened toes"   CESAREAN SECTION  1972   COLON SURGERY     LAPAROTOMY  09/05/2011   Procedure: EXPLORATORY LAPAROTOMY;  Surgeon: Emelia Loron, MD;  [facility]: [facility];  Service: General;  Laterality: N/A;   LEFT HEART CATH AND CORONARY ANGIOGRAPHY N/A 12/12/2017   Procedure: LEFT HEART CATH AND CORONARY ANGIOGRAPHY;  Surgeon: Lyn Records, MD;  [facility];  Service: Cardiovascular;  Laterality: N/A;   TUBAL LIGATION  1974     Current Outpatient Medications  Medication Sig Dispense Refill   amiodarone (PACERONE) 100 MG tablet TAKE 1 TABLET BY MOUTH EVERY DAY 90 tablet 3   atorvastatin (LIPITOR) 40 MG tablet Take 1 tablet  (40 mg total) by mouth daily. 90 tablet 3   calcium carbonate (OS-CAL) 600 MG TABS tablet Take 1,200 mg by mouth daily with breakfast.     carvedilol (COREG) 3.125 MG tablet TAKE 1 TABLET BY MOUTH TWICE A DAY WITH MEALS 180 tablet 3   ENTRESTO 24-26 MG TAKE 1 TABLET BY MOUTH 2 TIMES DAILY. *NEEDS FOLLOW UP FOR ANYMORE REFILLS* 60 tablet 6   FARXIGA 10 MG TABS tablet TAKE 1 TABLET BY MOUTH DAILY. *NEEDS FOLLOW UP FOR ANYMORE REFILLS* 30 tablet 6   furosemide (LASIX) 20 MG tablet Take 1 tablet (20 mg total) by mouth daily as needed. 10 tablet 3   potassium chloride SA (KLOR-CON) 20 MEQ tablet Take 1 tablet (20 mEq total) by mouth daily as needed (when you take Furosemide). 10 tablet 3   spironolactone (ALDACTONE) 25 MG tablet Take 1 tablet (25 mg total) by mouth daily. 90 tablet 3   No current facility-administered medications for this encounter.    Allergies:   Patient has no known allergies.   Social History:  The patient  reports that she quit smoking about 40 years ago. Her smoking use included cigarettes. She has a 5.00 pack-year smoking history. She quit smokeless tobacco use about 23 years ago.  Her smokeless tobacco use included snuff and chew. She reports that she does not currently use alcohol. She reports that she does not use drugs.   Family History:  The patient's family history includes Sudden Cardiac Death in her mother.   ROS:  Please see the history of present illness.   All other systems are personally reviewed and negative.   Vitals:   05/01/22 0935  BP: 110/70  Pulse: 78  SpO2: 98%  Weight: 76.1 kg (167 lb 12.8 oz)    Exam:   General:  Well appearing. No resp difficulty HEENT: normal Neck: supple. no JVD. Carotids 2+ bilat; no bruits. No lymphadenopathy or thryomegaly appreciated. Cor: PMI nondisplaced. Regular rate & rhythm. No rubs, gallops or murmurs. Lungs: clear Abdomen: soft, nontender, nondistended. No hepatosplenomegaly. No bruits or masses. Good bowel  sounds. Extremities: no cyanosis, clubbing, rash, edema Neuro: alert & orientedx3, cranial nerves grossly intact. moves all 4 extremities w/o difficulty. Affect pleasant   Recent Labs: 10/20/2021: ALT 17; B Natriuretic Peptide 297.2; BUN 10; Creatinine, Ser 1.44; Hemoglobin 13.8; Platelets 135; Potassium 4.6; Sodium 136; TSH 0.677  Personally reviewed   Wt Readings from Last 3 Encounters:  05/01/22 76.1 kg (167 lb 12.8 oz)  10/20/21 75.7 kg (166 lb 12.8 oz)  10/25/20 73.3 kg (161 lb 9.6 oz)      ASSESSMENT AND PLAN:  1. Chronic systolic HF due to NICM.  -Normal coronaries on [facility] 12/11/17. cMRI shows no  infiltrative or inflammatory CM. No family hx of HF. No ETOH. No hx of HTN. No recent virus. Frequent PVCs remains only real smoking gun but we have suppressed PVCs and EF still down. Unable to get cMRI due to ICD - Echo 12/12/17 EF 15% with grade 1 DD, no LV thrombus, mild MR, trivial TR, PA peak pressure 31 mmHg, and trivial pericardial effusion.  - Echo 03/27/18 EF 20-25% but LV less dilated - cMRI 5/19 EF 17% no LGE - Echo 08/22/2018 EF remains low ~20% - s/p BSCi CRT-D in 3/20 - Echo 02/20/19 EF 25% Mildly reduced RV function - PYP 10/20 Read as strongly suggestive of TTR with H/CL of 1.65 but visually it is equivocal at worst (I think negative) - CPX 2/20 BP rest: 98/60 BP peak: 112/60 Peak VO2: 11.7 (64% predicted peak VO2) VE/VCO2 slope: 41 Peak RER: 1.13 - Echo 05/22: EF 20-25%, RV okay - ICD interrogation: HL score 0, No VT/AF. Activity 2.3 hr/day Personally reviewed - Stable NYHA I-II symptoms despite severe LV dysfunction and CPX test 2/20 showing significant HF limitation. - Volume status looks good. Not requiring lasix - Continue Entresto 24/26 bid. - Continue spiro  25 mg daily.  - Continue coreg 3.125 mg BID. Intolerant to up-titration. - Continue Farxiga 10mg  daily  - On good GDMT. Has not tolerated titration  - Repeat echo. If EF still down consider repeating PYP  2.  Frequent PVCs - ~20% burden on tele initially - Zio Patch 03/27/18 with sinus rhythm with RBBB, Rare PVCs, No high-grade arrythmias or pauses. Pt triggered events all NSR.  - Most likely cause of her NICM. So far EF has not improved with PVC suppression - Continue amio 100 daily - Reiterated importance of annual eye exams with Amiodarone - Amio labs today  3. Hyperlipidemia  - Continue statin therapy.   4. Snoring - Sleep study negative (AHI 4.9)     Signed, Glori Bickers, MD  05/01/2022 9:57 AM  [facility] [place] [address] [phone] (office) [phone] (fax)

## 2022-05-08 NOTE — Progress Notes (Deleted)
Electrophysiology Office Note Date: 05/08/2022  ID:  Savannah Bell, DOB 1951/07/10, MRN [id number]  PCP: Maudie Flakes, FNP Primary Cardiologist: [name], MD Electrophysiologist: Lewayne Bunting, MD   CC: Routine ICD follow-up  Savannah Bell is a 71 y.o. female seen today for Lewayne Bunting, MD for routine electrophysiology followup. Since last being seen in our clinic the patient reports doing ***.  she denies chest pain, palpitations, dyspnea, PND, orthopnea, nausea, vomiting, dizziness, syncope, edema, weight gain, or early satiety.     He has not had ICD shocks.   Device History: [employer] BiV ICD implanted 09/2018 for chronic systolic CHF  Past Medical History:  Diagnosis Date   CHF (congestive heart failure) (HCC)    History of blood transfusion    "when I had colon resection" (12/11/2017)   Migraine    "stopped in the 1970s" (12/11/2017)   Mitral valve prolapse    Past Surgical History:  Procedure Laterality Date   ABDOMINAL HYSTERECTOMY  1998   APPENDECTOMY  2003   APPLICATION OF WOUND VAC  09/05/2011   Procedure: APPLICATION OF WOUND VAC;  Surgeon: Emelia Loron, MD;  [facility];  Service: General;  Laterality: N/A;   BIV ICD INSERTION CRT-D N/A 09/30/2018   Procedure: BIV ICD INSERTION CRT-D;  Surgeon: Marinus Maw, MD;  [facility];  Service: Cardiovascular;  Laterality: N/A;   BOWEL RESECTION  09/05/2011   Procedure: SMALL BOWEL RESECTION;  Surgeon: Emelia Loron, MD;  [facility];  Service: General;  Laterality: N/A;   BUNIONECTOMY WITH HAMMERTOE RECONSTRUCTION Bilateral    "& shortened toes"   CESAREAN SECTION  1972   COLON SURGERY     LAPAROTOMY  09/05/2011   Procedure: EXPLORATORY LAPAROTOMY;  Surgeon: Emelia Loron, MD;  [facility];  Service: General;  Laterality: N/A;   LEFT HEART CATH AND CORONARY ANGIOGRAPHY N/A 12/12/2017   Procedure: LEFT HEART CATH AND CORONARY ANGIOGRAPHY;   Surgeon: Lyn Records, MD;  [facility];  Service: Cardiovascular;  Laterality: N/A;   TUBAL LIGATION  1974    Current Outpatient Medications  Medication Sig Dispense Refill   amiodarone (PACERONE) 100 MG tablet TAKE 1 TABLET BY MOUTH EVERY DAY 90 tablet 3   atorvastatin (LIPITOR) 40 MG tablet Take 1 tablet (40 mg total) by mouth daily. 90 tablet 3   calcium carbonate (OS-CAL) 600 MG TABS tablet Take 1,200 mg by mouth daily with breakfast.     carvedilol (COREG) 3.125 MG tablet TAKE 1 TABLET BY MOUTH TWICE A DAY WITH MEALS 180 tablet 3   ENTRESTO 24-26 MG TAKE 1 TABLET BY MOUTH 2 TIMES DAILY. *NEEDS FOLLOW UP FOR ANYMORE REFILLS* 60 tablet 6   FARXIGA 10 MG TABS tablet TAKE 1 TABLET BY MOUTH DAILY. *NEEDS FOLLOW UP FOR ANYMORE REFILLS* 30 tablet 6   furosemide (LASIX) 20 MG tablet Take 1 tablet (20 mg total) by mouth daily as needed. 10 tablet 3   potassium chloride SA (KLOR-CON) 20 MEQ tablet Take 1 tablet (20 mEq total) by mouth daily as needed (when you take Furosemide). 10 tablet 3   spironolactone (ALDACTONE) 25 MG tablet Take 1 tablet (25 mg total) by mouth daily. 90 tablet 3   No current facility-administered medications for this visit.    Allergies:   Patient has no known allergies.   Social History: Social History   Socioeconomic History   Marital status: Divorced    Spouse name:  Not on file   Number of children: Not on file   Years of education: Not on file   Highest education level: Not on file  Occupational History   Not on file  Tobacco Use   Smoking status: Former    Packs/day: 0.50    Years: 10.00    Total pack years: 5.00    Types: Cigarettes    Quit date: 36    Years since quitting: 40.8   Smokeless tobacco: Former    Types: Snuff, Chew    Quit date: 2000  Vaping Use   Vaping Use: Never used  Substance and Sexual Activity   Alcohol use: Not Currently   Drug use: Never   Sexual activity: Not Currently  Other Topics Concern   Not  on file  Social History Narrative   Not on file   Social Determinants of Health   Financial Resource Strain: Not on file  Food Insecurity: Not on file  Transportation Needs: No Transportation Needs (10/24/2021)   PRAPARE - Administrator, Civil Service (Medical): No    Lack of Transportation (Non-Medical): No  Physical Activity: Not on file  Stress: Not on file  Social Connections: Not on file  Intimate Partner Violence: Not on file    Family History: Family History  Problem Relation Age of Onset   Sudden Cardiac Death Mother     Review of Systems: All other systems reviewed and are otherwise negative except as noted above.   Physical Exam: There were no vitals filed for this visit.   GEN- The patient is well appearing, alert and oriented x 3 today.   HEENT: normocephalic, atraumatic; sclera clear, conjunctiva pink; hearing intact; oropharynx clear; neck supple, no JVP Lymph- no cervical lymphadenopathy Lungs- Clear to ausculation bilaterally, normal work of breathing.  No wheezes, rales, rhonchi Heart- Regular  rate and rhythm, no murmurs, rubs or gallops, PMI not laterally displaced GI- soft, non-tender, non-distended, bowel sounds present, no hepatosplenomegaly Extremities- no clubbing or cyanosis. No peripheral edema; DP/PT/radial pulses 2+ bilaterally MS- no significant deformity or atrophy Skin- warm and dry, no rash or lesion; ICD pocket well healed Psych- euthymic mood, full affect Neuro- strength and sensation are intact  ICD interrogation- reviewed in detail today,  See PACEART report  EKG:  EKG is ordered today. Personal review of EKG ordered today shows ***  Recent Labs: 05/01/2022: ALT 16; B Natriuretic Peptide 391.9; BUN 20; Creatinine, Ser 1.46; Hemoglobin 13.8; Platelets 155; Potassium 4.3; Sodium 139; TSH 1.829   Wt Readings from Last 3 Encounters:  05/01/22 167 lb 12.8 oz (76.1 kg)  10/20/21 166 lb 12.8 oz (75.7 kg)  10/25/20 161 lb  9.6 oz (73.3 kg)     Other studies Reviewed: Additional studies/ records that were reviewed today include: Previous EP office notes.   Assessment and Plan:  1.  Chronic systolic dysfunction s/p Environmental manager CRT-D  euvolemic today Stable on an appropriate medical regimen Normal ICD function See Pace Art report No changes today  2. PVCs Continue amiodarone 100 mg daily Surveillance labs today  3. HLD Continue statin   Current medicines are reviewed at length with the patient today.   =  Labs/ tests ordered today include: *** No orders of the defined types were placed in this encounter.    Disposition:   Follow up with {EPMDS:28135} {Blank single:19197::"in 2 weeks","in 4 weeks","in 3 months","in 6 months","in 12 months","as usual post gen change"}    Signed, Graciella Freer,  PA-C  05/08/2022 10:25 AM  [facility] [address] [place] [address] [phone] (office) [phone] (fax)

## 2022-05-10 ENCOUNTER — Ambulatory Visit: Payer: 59 | Admitting: Student

## 2022-05-22 ENCOUNTER — Ambulatory Visit (HOSPITAL_COMMUNITY)
Admission: RE | Admit: 2022-05-22 | Discharge: 2022-05-22 | Disposition: A | Discharge status: Home / Self Care | Payer: 59 | Source: Ambulatory Visit | Attending: Internal Medicine | Admitting: Internal Medicine

## 2022-05-22 DIAGNOSIS — Z950 Presence of cardiac pacemaker: Secondary | ICD-10-CM | POA: Diagnosis not present

## 2022-05-22 DIAGNOSIS — I5022 Chronic systolic (congestive) heart failure: Secondary | ICD-10-CM | POA: Insufficient documentation

## 2022-05-22 DIAGNOSIS — I429 Cardiomyopathy, unspecified: Secondary | ICD-10-CM | POA: Diagnosis not present

## 2022-05-22 DIAGNOSIS — I340 Nonrheumatic mitral (valve) insufficiency: Secondary | ICD-10-CM | POA: Diagnosis not present

## 2022-05-22 DIAGNOSIS — I252 Old myocardial infarction: Secondary | ICD-10-CM | POA: Insufficient documentation

## 2022-05-22 DIAGNOSIS — Z95810 Presence of automatic (implantable) cardiac defibrillator: Secondary | ICD-10-CM | POA: Insufficient documentation

## 2022-05-22 DIAGNOSIS — I5040 Unspecified combined systolic (congestive) and diastolic (congestive) heart failure: Secondary | ICD-10-CM | POA: Diagnosis present

## 2022-05-22 LAB — ECHOCARDIOGRAM COMPLETE
Area-P 1/2: 3.37 cm2
Calc EF: 20.9 %
S' Lateral: 5.1 cm
Single Plane A2C EF: 20.2 %
Single Plane A4C EF: 22.5 %

## 2022-05-22 NOTE — Progress Notes (Signed)
  Echocardiogram 2D Echocardiogram has been performed.  Savannah [place] 05/22/2022, 9:45 AM

## 2022-06-06 NOTE — Progress Notes (Unsigned)
Electrophysiology Office Note Date: 06/07/2022  ID:  Savannah Bell, DOB [birth year], MRN [id number]  PCP: Maudie Flakes, FNP Primary Cardiologist: [name], MD Electrophysiologist: Lewayne Bunting, MD   CC: Routine ICD follow-up  Savannah Bell is a 71 y.o. female seen today for Lewayne Bunting, MD for routine electrophysiology followup. Since last being seen in our clinic the patient reports doing well overall.  she denies chest pain, palpitations, dyspnea, PND, orthopnea, nausea, vomiting, dizziness, syncope, edema, weight gain, or early satiety.   Device History: [employer] BiV ICD implanted 09/2018 for chronic systolic CHF   Past Medical History:  Diagnosis Date   CHF (congestive heart failure) (HCC)    History of blood transfusion    "when I had colon resection" (12/11/2017)   Migraine    "stopped in the 1970s" (12/11/2017)   Mitral valve prolapse    Past Surgical History:  Procedure Laterality Date   ABDOMINAL HYSTERECTOMY  1998   APPENDECTOMY  2003   APPLICATION OF WOUND VAC  09/05/2011   Procedure: APPLICATION OF WOUND VAC;  Surgeon: Emelia Loron, MD;  Location: [facility];  Service: General;  Laterality: N/A;   BIV ICD INSERTION CRT-D N/A 09/30/2018   Procedure: BIV ICD INSERTION CRT-D;  Surgeon: Marinus Maw, MD;  [facility]: [facility];  Service: Cardiovascular;  Laterality: N/A;   BOWEL RESECTION  09/05/2011   Procedure: SMALL BOWEL RESECTION;  Surgeon: Emelia Loron, MD;  Location: [facility];  Service: General;  Laterality: N/A;   BUNIONECTOMY WITH HAMMERTOE RECONSTRUCTION Bilateral    "& shortened toes"   CESAREAN SECTION  1972   COLON SURGERY     LAPAROTOMY  09/05/2011   Procedure: EXPLORATORY LAPAROTOMY;  Surgeon: Emelia Loron, MD;  Location: [facility];  Service: General;  Laterality: N/A;   LEFT HEART CATH AND CORONARY ANGIOGRAPHY N/A 12/12/2017   Procedure: LEFT HEART CATH AND CORONARY ANGIOGRAPHY;  Surgeon: Lyn Records, MD;   [facility]: [facility];  Service: Cardiovascular;  Laterality: N/A;   TUBAL LIGATION  1974    Current Outpatient Medications  Medication Sig Dispense Refill   amiodarone (PACERONE) 100 MG tablet TAKE 1 TABLET BY MOUTH EVERY DAY 90 tablet 3   atorvastatin (LIPITOR) 40 MG tablet Take 1 tablet (40 mg total) by mouth daily. 90 tablet 3   calcium carbonate (OS-CAL) 600 MG TABS tablet Take 1,200 mg by mouth daily with breakfast.     carvedilol (COREG) 3.125 MG tablet TAKE 1 TABLET BY MOUTH TWICE A DAY WITH MEALS 180 tablet 3   ENTRESTO 24-26 MG TAKE 1 TABLET BY MOUTH 2 TIMES DAILY. *NEEDS FOLLOW UP FOR ANYMORE REFILLS* 60 tablet 6   FARXIGA 10 MG TABS tablet TAKE 1 TABLET BY MOUTH DAILY. *NEEDS FOLLOW UP FOR ANYMORE REFILLS* 30 tablet 6   potassium chloride SA (KLOR-CON) 20 MEQ tablet Take 1 tablet (20 mEq total) by mouth daily as needed (when you take Furosemide). 10 tablet 3   No current facility-administered medications for this visit.    Allergies:   Patient has no known allergies.   Social History: Social History   Socioeconomic History   Marital status: Divorced    Spouse name: Not on file   Number of children: Not on file   Years of education: Not on file   Highest education level: Not on file  Occupational History   Not on file  Tobacco Use   Smoking status: Former    Packs/day: 0.50  Years: 10.00    Total pack years: 5.00    Types: Cigarettes    Quit date: 13    Years since quitting: 40.9   Smokeless tobacco: Former    Types: Snuff, Chew    Quit date: 2000  Vaping Use   Vaping Use: Never used  Substance and Sexual Activity   Alcohol use: Not Currently   Drug use: Never   Sexual activity: Not Currently  Other Topics Concern   Not on file  Social History Narrative   Not on file   Social Determinants of Health   Financial Resource Strain: Not on file  Food Insecurity: Not on file  Transportation Needs: No Transportation Needs (10/24/2021)   PRAPARE -  Administrator, Civil Service (Medical): No    Lack of Transportation (Non-Medical): No  Physical Activity: Not on file  Stress: Not on file  Social Connections: Not on file  Intimate Partner Violence: Not on file    Family History: Family History  Problem Relation Age of Onset   Sudden Cardiac Death Mother     Review of Systems: All other systems reviewed and are otherwise negative except as noted above.   Physical Exam: Vitals:   06/07/22 0833  BP: 94/62  Pulse: 69  SpO2: 94%  Weight: 169 lb 12.8 oz (77 kg)  Height: 4\' 11"  (1.499 m)     GEN- The patient is well appearing, alert and oriented x 3 today.   HEENT: normocephalic, atraumatic; sclera clear, conjunctiva pink; hearing intact; oropharynx clear; neck supple, no JVP Lymph- no cervical lymphadenopathy Lungs- Clear to ausculation bilaterally, normal work of breathing.  No wheezes, rales, rhonchi Heart- Regular  rate and rhythm, no murmurs, rubs or gallops, PMI not laterally displaced GI- soft, non-tender, non-distended, bowel sounds present, no hepatosplenomegaly Extremities- no clubbing or cyanosis. No peripheral edema; DP/PT/radial pulses 2+ bilaterally MS- no significant deformity or atrophy Skin- warm and dry, no rash or lesion; ICD pocket well healed Psych- euthymic mood, full affect Neuro- strength and sensation are intact  ICD interrogation- reviewed in detail today,  See PACEART report  EKG:  EKG is ordered today. Personal review of EKG ordered today shows V pacing at 69 bpm  Recent Labs: 05/01/2022: ALT 16; B Natriuretic Peptide 391.9; BUN 20; Creatinine, Ser 1.46; Hemoglobin 13.8; Platelets 155; Potassium 4.3; Sodium 139; TSH 1.829   Wt Readings from Last 3 Encounters:  06/07/22 169 lb 12.8 oz (77 kg)  05/01/22 167 lb 12.8 oz (76.1 kg)  10/20/21 166 lb 12.8 oz (75.7 kg)     Other studies Reviewed: Additional studies/ records that were reviewed today include: Previous EP office notes.    ECHO COMPLETE WO IMAGING ENHANCING AGENT 05/22/2022 1. Global longitudinal strain is -8.9% Severe LV dysfunction with akinesis of the septum and inferolateral wall; severe hypokinesis elsewhere.. Left ventricular ejection fraction, by estimation, is 20 to 25%. The left ventricle has severely decreased function. The left ventricular internal cavity size was mildly dilated. Left ventricular diastolic parameters are consistent with Grade I diastolic dysfunction (impaired relaxation). Elevated left atrial pressure. 2. Right ventricular systolic function is normal. The right ventricular size is normal. There is normal pulmonary artery systolic pressure. 3. The mitral valve is normal in structure. Mild mitral valve regurgitation. 4. The aortic valve is normal in structure. Aortic valve regurgitation is not visualized.  Comparison(s): The left ventricular function is unchanged.   Assessment and Plan:  1.  Chronic systolic dysfunction s/p 13/12/2021 CRT-D  euvolemic today Stable on an appropriate medical regimen Normal ICD function See Pace Art report No changes today  2. PVCs Continue amiodarone 100 mg daily Surveillance labs OK 05/01/2022  3. Snoring Negative sleep study  Current medicines are reviewed at length with the patient today.    Disposition:   Follow up with Dr. Ladona Ridgel in 12 months    Signed, Graciella Freer, PA-C  06/07/2022 8:42 AM  [facility] [address] [place] [address] [phone] (office) [phone] (fax)

## 2022-06-07 ENCOUNTER — Encounter: Payer: Self-pay | Admitting: Student

## 2022-06-07 ENCOUNTER — Ambulatory Visit: Payer: 59 | Attending: Student | Admitting: Student

## 2022-06-07 VITALS — BP 94/62 | HR 69 | Ht 59.0 in | Wt 169.8 lb

## 2022-06-07 DIAGNOSIS — I5022 Chronic systolic (congestive) heart failure: Secondary | ICD-10-CM

## 2022-06-07 DIAGNOSIS — I493 Ventricular premature depolarization: Secondary | ICD-10-CM

## 2022-06-07 DIAGNOSIS — R0683 Snoring: Secondary | ICD-10-CM

## 2022-06-07 LAB — CUP PACEART INCLINIC DEVICE CHECK
Date Time Interrogation Session: 20231122104114
HighPow Impedance: 73 Ohm
Implantable Lead Connection Status: 753985
Implantable Lead Connection Status: 753985
Implantable Lead Connection Status: 753985
Implantable Lead Implant Date: 20200316
Implantable Lead Implant Date: 20200316
Implantable Lead Implant Date: 20200316
Implantable Lead Location: 753858
Implantable Lead Location: 753859
Implantable Lead Location: 753860
Implantable Lead Model: 292
Implantable Lead Model: 4671
Implantable Lead Model: 7740
Implantable Lead Serial Number: 1013232
Implantable Lead Serial Number: 446892
Implantable Lead Serial Number: 827624
Implantable Pulse Generator Implant Date: 20200316
Lead Channel Impedance Value: 1156 Ohm
Lead Channel Impedance Value: 403 Ohm
Lead Channel Impedance Value: 795 Ohm
Lead Channel Pacing Threshold Amplitude: 1 V
Lead Channel Pacing Threshold Amplitude: 1.2 V
Lead Channel Pacing Threshold Amplitude: 1.6 V
Lead Channel Pacing Threshold Pulse Width: 0.4 ms
Lead Channel Pacing Threshold Pulse Width: 0.6 ms
Lead Channel Pacing Threshold Pulse Width: 1 ms
Lead Channel Sensing Intrinsic Amplitude: 12.1 mV
Lead Channel Sensing Intrinsic Amplitude: 15.2 mV
Lead Channel Sensing Intrinsic Amplitude: 7 mV
Lead Channel Setting Pacing Amplitude: 2.2 V
Lead Channel Setting Pacing Amplitude: 2.5 V
Lead Channel Setting Pacing Amplitude: 2.5 V
Lead Channel Setting Pacing Pulse Width: 0.6 ms
Lead Channel Setting Pacing Pulse Width: 1 ms
Lead Channel Setting Sensing Sensitivity: 0.5 mV
Lead Channel Setting Sensing Sensitivity: 1 mV
Pulse Gen Serial Number: 221486
Zone Setting Status: 755011

## 2022-06-07 NOTE — Patient Instructions (Signed)
Medication Instructions:  Your physician recommends that you continue on your current medications as directed. Please refer to the Current Medication list given to you today.  *If you need a refill on your cardiac medications before your next appointment, please call your pharmacy*   Lab Work: None If you have labs (blood work) drawn today and your tests are completely normal, you will receive your results only by: MyChart Message (if you have MyChart) OR A paper copy in the mail If you have any lab test that is abnormal or we need to change your treatment, we will call you to review the results.    Follow-Up: At [facility], you and your health needs are our priority.  As part of our continuing mission to provide you with exceptional heart care, we have created designated Provider Care Teams.  These Care Teams include your primary Cardiologist (physician) and Advanced Practice Providers (APPs -  Physician Assistants and Nurse Practitioners) who all work together to provide you with the care you need, when you need it.   Your next appointment:   1 year(s)  The format for your next appointment:   In Person  Provider:    Gregg Taylor, MD    Important Information About Sugar       

## 2022-07-04 ENCOUNTER — Ambulatory Visit (INDEPENDENT_AMBULATORY_CARE_PROVIDER_SITE_OTHER): Payer: 59

## 2022-07-04 DIAGNOSIS — I428 Other cardiomyopathies: Secondary | ICD-10-CM

## 2022-07-04 LAB — CUP PACEART REMOTE DEVICE CHECK
Battery Remaining Longevity: 78 mo
Battery Remaining Percentage: 87 %
Brady Statistic RA Percent Paced: 0 %
Brady Statistic RV Percent Paced: 97 %
Date Time Interrogation Session: 20231219032100
HighPow Impedance: 72 Ohm
Implantable Lead Connection Status: 753985
Implantable Lead Connection Status: 753985
Implantable Lead Connection Status: 753985
Implantable Lead Implant Date: 20200316
Implantable Lead Implant Date: 20200316
Implantable Lead Implant Date: 20200316
Implantable Lead Location: 753858
Implantable Lead Location: 753859
Implantable Lead Location: 753860
Implantable Lead Model: 292
Implantable Lead Model: 4671
Implantable Lead Model: 7740
Implantable Lead Serial Number: 1013232
Implantable Lead Serial Number: 446892
Implantable Lead Serial Number: 827624
Implantable Pulse Generator Implant Date: 20200316
Lead Channel Impedance Value: 1137 Ohm
Lead Channel Impedance Value: 415 Ohm
Lead Channel Impedance Value: 838 Ohm
Lead Channel Setting Pacing Amplitude: 2.2 V
Lead Channel Setting Pacing Amplitude: 2.5 V
Lead Channel Setting Pacing Amplitude: 2.5 V
Lead Channel Setting Pacing Pulse Width: 0.6 ms
Lead Channel Setting Pacing Pulse Width: 1 ms
Lead Channel Setting Sensing Sensitivity: 0.5 mV
Lead Channel Setting Sensing Sensitivity: 1 mV
Pulse Gen Serial Number: 221486
Zone Setting Status: 755011

## 2022-07-14 ENCOUNTER — Other Ambulatory Visit (HOSPITAL_COMMUNITY): Payer: Self-pay | Admitting: Internal Medicine

## 2022-07-31 NOTE — Progress Notes (Signed)
Remote ICD transmission.   

## 2022-10-03 ENCOUNTER — Ambulatory Visit (INDEPENDENT_AMBULATORY_CARE_PROVIDER_SITE_OTHER): Payer: 59

## 2022-10-03 DIAGNOSIS — I428 Other cardiomyopathies: Secondary | ICD-10-CM

## 2022-10-04 LAB — CUP PACEART REMOTE DEVICE CHECK
Battery Remaining Longevity: 72 mo
Battery Remaining Percentage: 84 %
Brady Statistic RA Percent Paced: 0 %
Brady Statistic RV Percent Paced: 97 %
Date Time Interrogation Session: 20240319062300
HighPow Impedance: 77 Ohm
Implantable Lead Connection Status: 753985
Implantable Lead Connection Status: 753985
Implantable Lead Connection Status: 753985
Implantable Lead Implant Date: 20200316
Implantable Lead Implant Date: 20200316
Implantable Lead Implant Date: 20200316
Implantable Lead Location: 753858
Implantable Lead Location: 753859
Implantable Lead Location: 753860
Implantable Lead Model: 292
Implantable Lead Model: 4671
Implantable Lead Model: 7740
Implantable Lead Serial Number: 1013232
Implantable Lead Serial Number: 446892
Implantable Lead Serial Number: 827624
Implantable Pulse Generator Implant Date: 20200316
Lead Channel Impedance Value: 1159 Ohm
Lead Channel Impedance Value: 424 Ohm
Lead Channel Impedance Value: 783 Ohm
Lead Channel Setting Pacing Amplitude: 2.2 V
Lead Channel Setting Pacing Amplitude: 2.5 V
Lead Channel Setting Pacing Amplitude: 2.5 V
Lead Channel Setting Pacing Pulse Width: 0.6 ms
Lead Channel Setting Pacing Pulse Width: 1 ms
Lead Channel Setting Sensing Sensitivity: 0.5 mV
Lead Channel Setting Sensing Sensitivity: 1 mV
Pulse Gen Serial Number: 221486
Zone Setting Status: 755011

## 2022-10-09 ENCOUNTER — Other Ambulatory Visit (HOSPITAL_COMMUNITY): Payer: Self-pay | Admitting: Internal Medicine

## 2022-10-26 ENCOUNTER — Other Ambulatory Visit (HOSPITAL_COMMUNITY): Payer: Self-pay | Admitting: Internal Medicine

## 2022-11-13 NOTE — Progress Notes (Signed)
Remote ICD transmission.   

## 2022-11-15 ENCOUNTER — Other Ambulatory Visit (HOSPITAL_COMMUNITY): Payer: Self-pay | Admitting: Internal Medicine

## 2022-11-18 ENCOUNTER — Other Ambulatory Visit (HOSPITAL_COMMUNITY): Payer: Self-pay | Admitting: Internal Medicine

## 2022-12-08 ENCOUNTER — Encounter (HOSPITAL_COMMUNITY): Payer: 59

## 2022-12-18 ENCOUNTER — Other Ambulatory Visit (HOSPITAL_COMMUNITY): Payer: Self-pay | Admitting: Internal Medicine

## 2022-12-28 ENCOUNTER — Ambulatory Visit (HOSPITAL_COMMUNITY)
Admission: RE | Admit: 2022-12-28 | Discharge: 2022-12-28 | Disposition: A | Discharge status: Home / Self Care | Payer: 59 | Source: Ambulatory Visit | Attending: Cardiology | Admitting: Cardiology

## 2022-12-28 ENCOUNTER — Encounter (HOSPITAL_COMMUNITY): Payer: Self-pay

## 2022-12-28 VITALS — BP 100/58 | HR 70 | Ht 59.0 in | Wt 168.0 lb

## 2022-12-28 DIAGNOSIS — I5022 Chronic systolic (congestive) heart failure: Secondary | ICD-10-CM | POA: Diagnosis present

## 2022-12-28 DIAGNOSIS — Z7984 Long term (current) use of oral hypoglycemic drugs: Secondary | ICD-10-CM | POA: Insufficient documentation

## 2022-12-28 DIAGNOSIS — Z79899 Other long term (current) drug therapy: Secondary | ICD-10-CM | POA: Diagnosis not present

## 2022-12-28 DIAGNOSIS — N1832 Chronic kidney disease, stage 3b: Secondary | ICD-10-CM | POA: Diagnosis not present

## 2022-12-28 DIAGNOSIS — E785 Hyperlipidemia, unspecified: Secondary | ICD-10-CM | POA: Diagnosis not present

## 2022-12-28 DIAGNOSIS — R0609 Other forms of dyspnea: Secondary | ICD-10-CM | POA: Insufficient documentation

## 2022-12-28 DIAGNOSIS — I428 Other cardiomyopathies: Secondary | ICD-10-CM | POA: Diagnosis not present

## 2022-12-28 DIAGNOSIS — I4510 Unspecified right bundle-branch block: Secondary | ICD-10-CM | POA: Insufficient documentation

## 2022-12-28 NOTE — Progress Notes (Addendum)
Advanced Heart Failure Clinic Note   Date:  12/28/2022   ID:  Savannah Bell, DOB [birth year], MRN [id number]  Location: Home  Provider location: [place] [facility] Type of Visit: Established patient  PCP:  Maudie Flakes, FNP  Cardiologist:  [name], MD Primary HF: Bensimhon  Chief Complaint: Heart Failure follow-up   History of Present Illness:  HPI: Savannah Bell is a 72 y.o. 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    Admitted 5/19 with acute systolic HF. Echo EF 15%. R/LHC showed normal coronaries and normal LV pressures. cMRI showed EF 17% with no LGE. She was noted to have frequent PVC's on tele. Amio started.  We saw her in 2/20 and doing well but EF persistently 20% despite suppression of PVCs. Losartan increased and referred for ICD, CPX and sleep study.  Underwent implant of BSci CRT-D in 3/20 by Dr. Ladona Ridgel. CPX showed moderate to severe HF limitation. Home sleep study 4/20: No significant OSA. AHI 4.9  Echo 02/20/19 EF 25%  Personally reviewed  PYP 10/20 Read as strongly suggestive of TTR with H/CL of 1.65 but visually it is equivocal at worst (Reviewed by Dr. [name] who felt study was negative).   Echo 05/22: EF 20-25%, RV okay, mild MR  Echo 11/23: EF 20-25%, RV normal   She is here today for 6 month f/u.  Reports doing well. Still working at [employer]. Able to do ADLs and job duties w/o significant limitation or dyspnea. Denies CP. Device interrogation shows stable thoracic impedence. HL Score 3. No Afib. No VT. Activity level 2.2 hr/day. She is worried about her renal fx. Had recent labs done at PCP office. I have personally reviewed results. SCr was up slightly to 1.6 (prior baseline ~1.3-1.5). GFR 34. She has been referred to nephrology. Awaiting appt. She reports normal UOP. BP soft 100/58 c/w her baseline. No orthostatic symptoms.    ICD interrogation: HL score 3, No  VT/AF. Activity 2.2 hr/day Personally reviewed     Cardiac studies:  Echo 11/23: EF 20-25%, RV normal  Echo 05/22: EF 20-25%, RV okay Echo 08/22/2018: EF 20%,  Echo 03/27/18 EF 20-25% but LV less dilated  CPX 08/26/18  FVC 2.31 (109%)      FEV1 1.85 (109%)        FEV1/FVC 80 (100%)        MVV 74 (95%)       Resting HR: 72 Peak HR: 137   (89% age predicted max HR) BP rest: 98/60 BP peak: 112/60 Peak VO2: 11.7 (64% predicted peak VO2) VE/VCO2 slope:  41 OUES: 0.75 Peak RER: 1.13 VE/MVV:  46% O2pulse:  6   (75% predicted O2pulse)     R/LHC 5/29: that showed normal coronary arteries, EF 10-20%, normal LV filling pressures (no right sided pressures recorded).    Cardiac MRI 12/13/17: EF 17%, findings consistent with NICM with no evidence of LGE to suggest inflammatory or infiltrative cardiomyopathy.  who presents via Web designer for a telehealth visit today.       Past Medical History:  Diagnosis Date   CHF (congestive heart failure) (HCC)    History of blood transfusion    "when I had colon resection" (12/11/2017)   Migraine    "stopped in the 1970s" (12/11/2017)   Mitral valve prolapse    Past Surgical History:  Procedure Laterality Date   ABDOMINAL HYSTERECTOMY  1998   APPENDECTOMY  2003   APPLICATION OF WOUND VAC  09/05/2011   Procedure: APPLICATION OF WOUND VAC;  Surgeon: Emelia Loron, MD;  Location: [facility];  Service: General;  Laterality: N/A;   BIV ICD INSERTION CRT-D N/A 09/30/2018   Procedure: BIV ICD INSERTION CRT-D;  Surgeon: Marinus Maw, MD;  Location: [facility] INVASIVE CV LAB;  Service: Cardiovascular;  Laterality: N/A;   BOWEL RESECTION  09/05/2011   Procedure: SMALL BOWEL RESECTION;  Surgeon: Emelia Loron, MD;  Location: [facility];  Service: General;  Laterality: N/A;   BUNIONECTOMY WITH HAMMERTOE RECONSTRUCTION Bilateral    "& shortened toes"   CESAREAN SECTION  1972   COLON SURGERY     LAPAROTOMY  09/05/2011   Procedure: EXPLORATORY  LAPAROTOMY;  Surgeon: Emelia Loron, MD;  Location: [facility];  Service: General;  Laterality: N/A;   LEFT HEART CATH AND CORONARY ANGIOGRAPHY N/A 12/12/2017   Procedure: LEFT HEART CATH AND CORONARY ANGIOGRAPHY;  Surgeon: Lyn Records, MD;  Location: [facility];  Service: Cardiovascular;  Laterality: N/A;   TUBAL LIGATION  1974     Current Outpatient Medications  Medication Sig Dispense Refill   amiodarone (PACERONE) 100 MG tablet TAKE 1 TABLET BY MOUTH EVERY DAY 90 tablet 3   atorvastatin (LIPITOR) 40 MG tablet Take 1 tablet (40 mg total) by mouth daily. 90 tablet 3   calcium carbonate (OS-CAL) 600 MG TABS tablet Take 1,200 mg by mouth daily with breakfast.     Calcium Citrate-Vitamin D 500-12.5 MG-MCG PACK Take by mouth. vitaminD is separte rom calcium     carvedilol (COREG) 3.125 MG tablet TAKE 1 TABLET BY MOUTH TWICE A DAY WITH MEALS 180 tablet 3   dapagliflozin propanediol (FARXIGA) 10 MG TABS tablet Take 1 tablet (10 mg total) by mouth daily. 30 tablet 6   ENTRESTO 24-26 MG TAKE 1 TABLET BY MOUTH 2 TIMES DAILY. *NEEDS FOLLOW UP FOR ANYMORE REFILLS* 60 tablet 6   potassium chloride SA (KLOR-CON) 20 MEQ tablet Take 1 tablet (20 mEq total) by mouth daily as needed (when you take Furosemide). 10 tablet 3   No current facility-administered medications for this encounter.    Allergies:   Patient has no known allergies.   Social History:  The patient  reports that she quit smoking about 41 years ago. Her smoking use included cigarettes. She has a 5.00 pack-year smoking history. She quit smokeless tobacco use about 24 years ago.  Her smokeless tobacco use included snuff and chew. She reports that she does not currently use alcohol. She reports that she does not use drugs.   Family History:  The patient's family history includes Sudden Cardiac Death in her mother.   ROS:  Please see the history of present illness.   All other systems are personally reviewed and negative.    Vitals:   12/28/22 1333  BP: (!) 100/58  Pulse: 70  SpO2: 97%  Weight: 76.2 kg (168 lb)  Height: 4\' 11"  (1.499 m)     PHYSICAL EXAM: General:  Well appearing. No respiratory difficulty HEENT: normal Neck: supple. no JVD. Carotids 2+ bilat; no bruits. No lymphadenopathy or thyromegaly appreciated. Cor: PMI nondisplaced. Regular rate & rhythm. No rubs, gallops or murmurs. Lungs: clear Abdomen: soft, nontender, nondistended. No hepatosplenomegaly. No bruits or masses. Good bowel sounds. Extremities: no cyanosis, clubbing, rash, edema Neuro: alert & oriented x 3, cranial nerves grossly intact. moves all 4 extremities w/o difficulty. Affect pleasant.    Recent  Labs: 05/01/2022: ALT 16; B Natriuretic Peptide 391.9; BUN 20; Creatinine, Ser 1.46; Hemoglobin 13.8; Platelets 155; Potassium 4.3; Sodium 139; TSH 1.829  Personally reviewed   Wt Readings from Last 3 Encounters:  12/28/22 76.2 kg (168 lb)  06/07/22 77 kg (169 lb 12.8 oz)  05/01/22 76.1 kg (167 lb 12.8 oz)      ASSESSMENT AND PLAN:  1. Chronic systolic HF due to NICM.  -Normal coronaries on [facility] 12/11/17. cMRI shows no infiltrative or inflammatory CM. No family hx of HF. No ETOH. No hx of HTN. No recent virus. Frequent PVCs remains only real smoking gun but we have suppressed PVCs and EF still down. Unable to get cMRI due to ICD - Echo 12/12/17 EF 15% with grade 1 DD, no LV thrombus, mild MR, trivial TR, PA peak pressure 31 mmHg, and trivial pericardial effusion.  - Echo 03/27/18 EF 20-25% but LV less dilated - cMRI 5/19 EF 17% no LGE - Echo 08/22/2018 EF remains low ~20% - s/p BSCi CRT-D in 3/20 - Echo 02/20/19 EF 25% Mildly reduced RV function - PYP 10/20 Read as strongly suggestive of TTR with H/CL of 1.65 but visually it is equivocal at worst (I think negative) - CPX 2/20 BP rest: 98/60 BP peak: 112/60 Peak VO2: 11.7 (64% predicted peak VO2) VE/VCO2 slope: 41 Peak RER: 1.13 - Echo 05/22: EF 20-25%, RV okay - Echo  11/23: EF 20-25%, RV ok  - Stable NYHA I-II symptoms despite severe LV dysfunction and CPX test 2/20 showing significant HF limitation. - Euvolemic on exam and by device interrogation. HL score 3. No VT/VF.  - Continue Entresto 24/26 bid. BP too soft for titration.  - Continue spiro  25 mg daily.  - Continue coreg 3.125 mg BID. Intolerant to up-titration. - Continue Farxiga 10mg  daily. Denies GU symptoms  - does not need daily loop diuretic  - On good GDMT. Has not tolerated titration  - So far EF has not improved with PVC suppression. Will repeat PYP scan to revaluate for TTR amyloid.  - reviewed recent labs from PCP, SCr and K ok   2. Frequent PVCs - ~20% burden on tele initially - Zio Patch 03/27/18 with sinus rhythm with RBBB, Rare PVCs, No high-grade arrythmias or pauses. Pt triggered events all NSR.  - Continue amio 100 daily - Amio labs today (TFTs and HFTs). Needs annual eye exams   3. Hyperlipidemia  - Continue statin therapy. - PCP recently checked LP, LDL was acceptable at 76 mg/dL    4. Snoring - Sleep study negative (AHI 4.9)   5. CKD IIIb - b/l Scr ~1.6 - on Farxiga - PCP has referred to nephrology for monitoring   Plan f/u w/ Dr. [name] in 6 months. If PYP is abnormal, will bring back sooner.     Signed, Robbie Lis, PA-C  12/28/2022 2:33 PM  [facility] [address] [facility] [place] [address] [phone] (office) [phone] (fax)

## 2022-12-28 NOTE — Addendum Note (Signed)
Encounter addended by: Allayne Butcher, PA-C on: 12/28/2022 2:42 PM  Actions taken: Visit diagnoses modified

## 2022-12-28 NOTE — Patient Instructions (Addendum)
Medication Changes:  None  Lab Work:  None  Testing/Procedures:   This testing needs a precert and some one will call you to get scheduled.    Follow-Up in: 6 months with Dr. [name]  At the [facility], you and your health needs are our priority. We have a designated team specialized in the treatment of Heart Failure. This Care Team includes your primary Heart Failure Specialized Cardiologist (physician), Advanced Practice Providers (APPs- Physician Assistants and Nurse Practitioners), and Pharmacist who all work together to provide you with the care you need, when you need it.   You may see any of the following providers on your designated Care Team at your next follow up:  Dr. [name] Dr. Marca Ancona Dr. Marcos Eke, NP Robbie Lis, [place] [facility] [place], [place] Brynda Peon, NP Karle Plumber, PharmD   Please be sure to bring in all your medications bottles to every appointment.   Need to Contact us:  If you have any questions or concerns before your next appointment please send [place] a message through [place] or call our office at [phone].    TO LEAVE A MESSAGE FOR THE NURSE SELECT OPTION 2, PLEASE LEAVE A MESSAGE INCLUDING: YOUR NAME DATE OF BIRTH CALL BACK NUMBER REASON FOR CALL**this is important as we prioritize the call backs  YOU WILL RECEIVE A CALL BACK THE SAME DAY AS LONG AS YOU CALL BEFORE 4:00 PM

## 2023-01-02 ENCOUNTER — Ambulatory Visit: Payer: 59 | Attending: Internal Medicine

## 2023-01-02 DIAGNOSIS — I5022 Chronic systolic (congestive) heart failure: Secondary | ICD-10-CM

## 2023-01-02 DIAGNOSIS — I428 Other cardiomyopathies: Secondary | ICD-10-CM

## 2023-01-03 LAB — CUP PACEART REMOTE DEVICE CHECK
Date Time Interrogation Session: 20240617170314
Implantable Lead Connection Status: 753985
Implantable Lead Connection Status: 753985
Implantable Lead Connection Status: 753985
Implantable Lead Implant Date: 20200316
Implantable Lead Implant Date: 20200316
Implantable Lead Implant Date: 20200316
Implantable Lead Location: 753858
Implantable Lead Location: 753859
Implantable Lead Location: 753860
Implantable Lead Model: 292
Implantable Lead Model: 4671
Implantable Lead Model: 7740
Implantable Lead Serial Number: 1013232
Implantable Lead Serial Number: 446892
Implantable Lead Serial Number: 827624
Implantable Pulse Generator Implant Date: 20200316
Pulse Gen Serial Number: 221486

## 2023-01-24 NOTE — Progress Notes (Signed)
Remote ICD transmission.   

## 2023-02-13 ENCOUNTER — Other Ambulatory Visit (HOSPITAL_COMMUNITY): Payer: Self-pay | Admitting: Internal Medicine

## 2023-03-14 ENCOUNTER — Other Ambulatory Visit: Payer: Self-pay | Admitting: Nephrology

## 2023-03-14 DIAGNOSIS — N2581 Secondary hyperparathyroidism of renal origin: Secondary | ICD-10-CM

## 2023-03-14 DIAGNOSIS — N1832 Chronic kidney disease, stage 3b: Secondary | ICD-10-CM

## 2023-03-14 DIAGNOSIS — I5020 Unspecified systolic (congestive) heart failure: Secondary | ICD-10-CM

## 2023-03-14 DIAGNOSIS — N189 Chronic kidney disease, unspecified: Secondary | ICD-10-CM

## 2023-03-16 LAB — LAB REPORT - SCANNED
Creatinine, POC: 30.6 mg/dL
EGFR: 39

## 2023-03-23 ENCOUNTER — Ambulatory Visit
Admission: RE | Admit: 2023-03-23 | Discharge: 2023-03-23 | Disposition: A | Discharge status: Home / Self Care | Payer: 59 | Source: Ambulatory Visit | Attending: Nephrology | Admitting: Nephrology

## 2023-03-23 DIAGNOSIS — N2581 Secondary hyperparathyroidism of renal origin: Secondary | ICD-10-CM

## 2023-03-23 DIAGNOSIS — D631 Anemia in chronic kidney disease: Secondary | ICD-10-CM

## 2023-03-23 DIAGNOSIS — I5020 Unspecified systolic (congestive) heart failure: Secondary | ICD-10-CM

## 2023-03-23 DIAGNOSIS — N1832 Chronic kidney disease, stage 3b: Secondary | ICD-10-CM

## 2023-04-03 ENCOUNTER — Ambulatory Visit (INDEPENDENT_AMBULATORY_CARE_PROVIDER_SITE_OTHER): Payer: 59

## 2023-04-03 DIAGNOSIS — I428 Other cardiomyopathies: Secondary | ICD-10-CM

## 2023-04-03 DIAGNOSIS — I5022 Chronic systolic (congestive) heart failure: Secondary | ICD-10-CM

## 2023-04-18 NOTE — Progress Notes (Signed)
Remote ICD transmission.   

## 2023-06-13 ENCOUNTER — Ambulatory Visit: Payer: 59 | Attending: Internal Medicine | Admitting: Internal Medicine

## 2023-06-13 ENCOUNTER — Encounter: Payer: Self-pay | Admitting: Internal Medicine

## 2023-06-13 VITALS — BP 102/72 | HR 66 | Ht 59.0 in | Wt 171.6 lb

## 2023-06-13 DIAGNOSIS — E782 Mixed hyperlipidemia: Secondary | ICD-10-CM

## 2023-06-13 DIAGNOSIS — I442 Atrioventricular block, complete: Secondary | ICD-10-CM

## 2023-06-13 DIAGNOSIS — I428 Other cardiomyopathies: Secondary | ICD-10-CM | POA: Diagnosis not present

## 2023-06-13 LAB — CUP PACEART INCLINIC DEVICE CHECK
Date Time Interrogation Session: 20241127151809
HighPow Impedance: 73 Ohm
Implantable Lead Connection Status: 753985
Implantable Lead Connection Status: 753985
Implantable Lead Connection Status: 753985
Implantable Lead Implant Date: 20200316
Implantable Lead Implant Date: 20200316
Implantable Lead Implant Date: 20200316
Implantable Lead Location: 753858
Implantable Lead Location: 753859
Implantable Lead Location: 753860
Implantable Lead Model: 292
Implantable Lead Model: 4671
Implantable Lead Model: 7740
Implantable Lead Serial Number: 1013232
Implantable Lead Serial Number: 446892
Implantable Lead Serial Number: 827624
Implantable Pulse Generator Implant Date: 20200316
Lead Channel Impedance Value: 1166 Ohm
Lead Channel Impedance Value: 428 Ohm
Lead Channel Impedance Value: 773 Ohm
Lead Channel Pacing Threshold Amplitude: 0.9 V
Lead Channel Pacing Threshold Amplitude: 1.2 V
Lead Channel Pacing Threshold Amplitude: 1.5 V
Lead Channel Pacing Threshold Pulse Width: 0.4 ms
Lead Channel Pacing Threshold Pulse Width: 0.6 ms
Lead Channel Pacing Threshold Pulse Width: 1 ms
Lead Channel Sensing Intrinsic Amplitude: 5.5 mV
Lead Channel Setting Pacing Amplitude: 2.2 V
Lead Channel Setting Pacing Amplitude: 2.5 V
Lead Channel Setting Pacing Amplitude: 2.5 V
Lead Channel Setting Pacing Pulse Width: 0.6 ms
Lead Channel Setting Pacing Pulse Width: 1 ms
Lead Channel Setting Sensing Sensitivity: 0.5 mV
Lead Channel Setting Sensing Sensitivity: 1 mV
Pulse Gen Serial Number: 221486
Zone Setting Status: 755011

## 2023-06-13 NOTE — Patient Instructions (Addendum)

## 2023-06-13 NOTE — Progress Notes (Signed)
HPI Savannah Bell returns 4 yrs after undergoing insertion of a Biv ICD. She has chronic systolic heart failure. She has no obstructive CAD. She did not have an increase in her EF after her device placement.  She admits to dietary indiscretion and weight gain. She was 171 today and 143 when I saw her last in 2020.  No Known Allergies   Current Outpatient Medications  Medication Sig Dispense Refill   amiodarone (PACERONE) 100 MG tablet TAKE 1 TABLET BY MOUTH EVERY DAY 90 tablet 3   atorvastatin (LIPITOR) 40 MG tablet TAKE 1 TABLET BY MOUTH EVERY DAY 90 tablet 3   calcium carbonate (OS-CAL) 600 MG TABS tablet Take 1,200 mg by mouth daily with breakfast.     Calcium Citrate-Vitamin D 500-12.5 MG-MCG PACK Take by mouth. vitaminD is separte rom calcium     carvedilol (COREG) 3.125 MG tablet TAKE 1 TABLET BY MOUTH TWICE A DAY WITH MEALS 180 tablet 3   ENTRESTO 24-26 MG TAKE 1 TABLET BY MOUTH 2 TIMES DAILY. *NEEDS FOLLOW UP FOR ANYMORE REFILLS* 60 tablet 6   FARXIGA 10 MG TABS tablet TAKE 1 TABLET BY MOUTH EVERY DAY 30 tablet 6   potassium chloride SA (KLOR-CON) 20 MEQ tablet Take 1 tablet (20 mEq total) by mouth daily as needed (when you take Furosemide). 10 tablet 3   No current facility-administered medications for this visit.     Past Medical History:  Diagnosis Date   CHF (congestive heart failure) (HCC)    History of blood transfusion    "when I had colon resection" (12/11/2017)   Migraine    "stopped in the 1970s" (12/11/2017)   Mitral valve prolapse     ROS:   All systems reviewed and negative except as noted in the HPI.   Past Surgical History:  Procedure Laterality Date   ABDOMINAL HYSTERECTOMY  1998   APPENDECTOMY  2003   APPLICATION OF WOUND VAC  09/05/2011   Procedure: APPLICATION OF WOUND VAC;  Surgeon: Emelia Loron, MD;  Location: [facility];  Service: General;  Laterality: N/A;   BIV ICD INSERTION CRT-D N/A 09/30/2018   Procedure: BIV ICD INSERTION CRT-D;   Surgeon: Marinus Maw, MD;  [facility];  Service: Cardiovascular;  Laterality: N/A;   BOWEL RESECTION  09/05/2011   Procedure: SMALL BOWEL RESECTION;  Surgeon: Emelia Loron, MD;  Location: [facility];  Service: General;  Laterality: N/A;   BUNIONECTOMY WITH HAMMERTOE RECONSTRUCTION Bilateral    "& shortened toes"   CESAREAN SECTION  1972   COLON SURGERY     LAPAROTOMY  09/05/2011   Procedure: EXPLORATORY LAPAROTOMY;  Surgeon: Emelia Loron, MD;  Location: [facility];  Service: General;  Laterality: N/A;   LEFT HEART CATH AND CORONARY ANGIOGRAPHY N/A 12/12/2017   Procedure: LEFT HEART CATH AND CORONARY ANGIOGRAPHY;  Surgeon: Lyn Records, MD;  [facility];  Service: Cardiovascular;  Laterality: N/A;   TUBAL LIGATION  1974     Family History  Problem Relation Age of Onset   Sudden Cardiac Death Mother      Social History   Socioeconomic History   Marital status: Divorced    Spouse name: Not on file   Number of children: Not on file   Years of education: Not on file   Highest education level: Not on file  Occupational History   Not on file  Tobacco Use   Smoking status: Former    Current  packs/day: 0.00    Average packs/day: 0.5 packs/day for 10.0 years (5.0 ttl pk-yrs)    Types: Cigarettes    Start date: 55    Quit date: 48    Years since quitting: 41.9   Smokeless tobacco: Former    Types: Snuff, Chew    Quit date: 2000  Vaping Use   Vaping status: Never Used  Substance and Sexual Activity   Alcohol use: Not Currently   Drug use: Never   Sexual activity: Not Currently  Other Topics Concern   Not on file  Social History Narrative   Not on file   Social Determinants of Health   Financial Resource Strain: Low Risk  (10/30/2022)   Received from [facility], [facility]   Overall Financial Resource Strain (CARDIA)    Difficulty of Paying Living Expenses: Not hard at all  Food Insecurity: No Food Insecurity (10/30/2022)    Received from [facility], [facility]   Hunger Vital Sign    Worried About Running Out of Food in the Last Year: Never true    Ran Out of Food in the Last Year: Never true  Transportation Needs: No Transportation Needs (10/30/2022)   Received from [facility], [facility]   PRAPARE - Transportation    Lack of Transportation (Medical): No    Lack of Transportation (Non-Medical): No  Physical Activity: Unknown (10/30/2022)   Received from [facility], [facility]   Exercise Vital Sign    Days of Exercise per Week: 0 days    Minutes of Exercise per Session: Not on file  Stress: No Stress Concern Present (10/30/2022)   Received from [facility], [facility] Occupational [facility] - Occupational Stress Questionnaire    Feeling of Stress : Not at all  Social Connections: Socially Integrated (10/30/2022)   Received from [facility], [facility]   Social Network    How would you rate your social network (family, work, friends)?: Good participation with social networks  Intimate Partner Violence: Not At Risk (10/30/2022)   Received from [facility], [facility]   HITS    Over the last 12 months how often did your partner physically hurt you?: Never    Over the last 12 months how often did your partner insult you or talk down to you?: Never    Over the last 12 months how often did your partner threaten you with physical harm?: Never    Over the last 12 months how often did your partner scream or curse at you?: Never     Ht 4\' 11"  (1.499 m)   Wt 171 lb 9.6 oz (77.8 kg)   BMI 34.66 kg/m   Physical Exam:  obese appearing NAD HEENT: Unremarkable Neck:  No JVD, no thyromegally Lymphatics:  No adenopathy Back:  No CVA tenderness Lungs:  Clear with no wheezes HEART:  Regular rate rhythm, no murmurs, no rubs, no clicks Abd:  soft, positive bowel sounds, no organomegally, no rebound, no guarding Ext:  2 plus pulses, no edema, no cyanosis, no  clubbing Skin:  No rashes no nodules Neuro:  CN II through XII intact, motor grossly intact  EKG - nsr with ventricular pacing  DEVICE  Normal device function.  See PaceArt for details.   Assess/Plan:  1. Chronic systolic heart failure - her symptoms are class 2. She will continue her current meds.  2. ICD -her [employer] device is working normally. Her CHF parameters are stable. 3. Obesity -  I encouraged the patient to work on weight loss.  4. CAD - she denies anginal symptoms. She will continue her current meds.   Sharlot Gowda Tyneisha Hegeman,MD

## 2023-06-28 ENCOUNTER — Other Ambulatory Visit (HOSPITAL_COMMUNITY): Payer: Self-pay

## 2023-07-03 ENCOUNTER — Ambulatory Visit: Payer: 59

## 2023-07-03 DIAGNOSIS — I442 Atrioventricular block, complete: Secondary | ICD-10-CM | POA: Diagnosis not present

## 2023-07-04 LAB — CUP PACEART REMOTE DEVICE CHECK
Battery Remaining Longevity: 66 mo
Battery Remaining Percentage: 74 %
Brady Statistic RA Percent Paced: 0 %
Brady Statistic RV Percent Paced: 100 %
Date Time Interrogation Session: 20241217032000
HighPow Impedance: 70 Ohm
Implantable Lead Connection Status: 753985
Implantable Lead Connection Status: 753985
Implantable Lead Connection Status: 753985
Implantable Lead Implant Date: 20200316
Implantable Lead Implant Date: 20200316
Implantable Lead Implant Date: 20200316
Implantable Lead Location: 753858
Implantable Lead Location: 753859
Implantable Lead Location: 753860
Implantable Lead Model: 292
Implantable Lead Model: 4671
Implantable Lead Model: 7740
Implantable Lead Serial Number: 1013232
Implantable Lead Serial Number: 446892
Implantable Lead Serial Number: 827624
Implantable Pulse Generator Implant Date: 20200316
Lead Channel Impedance Value: 1096 Ohm
Lead Channel Impedance Value: 416 Ohm
Lead Channel Impedance Value: 725 Ohm
Lead Channel Setting Pacing Amplitude: 2.2 V
Lead Channel Setting Pacing Amplitude: 2.5 V
Lead Channel Setting Pacing Amplitude: 2.5 V
Lead Channel Setting Pacing Pulse Width: 0.6 ms
Lead Channel Setting Pacing Pulse Width: 1 ms
Lead Channel Setting Sensing Sensitivity: 0.5 mV
Lead Channel Setting Sensing Sensitivity: 1 mV
Pulse Gen Serial Number: 221486
Zone Setting Status: 755011

## 2023-07-05 ENCOUNTER — Other Ambulatory Visit (HOSPITAL_COMMUNITY): Payer: Self-pay | Admitting: Internal Medicine

## 2023-07-06 ENCOUNTER — Other Ambulatory Visit (HOSPITAL_COMMUNITY): Payer: Self-pay | Admitting: Internal Medicine

## 2023-07-19 ENCOUNTER — Other Ambulatory Visit (HOSPITAL_COMMUNITY): Payer: Self-pay

## 2023-08-09 NOTE — Addendum Note (Signed)
Addended by: Geralyn Flash D on: 08/09/2023 12:39 PM   Modules accepted: Orders

## 2023-08-09 NOTE — Progress Notes (Signed)
Remote ICD transmission.   

## 2023-10-02 ENCOUNTER — Ambulatory Visit (INDEPENDENT_AMBULATORY_CARE_PROVIDER_SITE_OTHER): Payer: 59

## 2023-10-02 DIAGNOSIS — I442 Atrioventricular block, complete: Secondary | ICD-10-CM | POA: Diagnosis not present

## 2023-10-03 ENCOUNTER — Encounter: Payer: Self-pay | Admitting: Internal Medicine

## 2023-10-03 LAB — CUP PACEART REMOTE DEVICE CHECK
Battery Remaining Longevity: 66 mo
Battery Remaining Percentage: 72 %
Brady Statistic RA Percent Paced: 0 %
Brady Statistic RV Percent Paced: 98 %
Date Time Interrogation Session: 20250318032100
HighPow Impedance: 76 Ohm
Implantable Lead Connection Status: 753985
Implantable Lead Connection Status: 753985
Implantable Lead Connection Status: 753985
Implantable Lead Implant Date: 20200316
Implantable Lead Implant Date: 20200316
Implantable Lead Implant Date: 20200316
Implantable Lead Location: 753858
Implantable Lead Location: 753859
Implantable Lead Location: 753860
Implantable Lead Model: 292
Implantable Lead Model: 4671
Implantable Lead Model: 7740
Implantable Lead Serial Number: 1013232
Implantable Lead Serial Number: 446892
Implantable Lead Serial Number: 827624
Implantable Pulse Generator Implant Date: 20200316
Lead Channel Impedance Value: 1018 Ohm
Lead Channel Impedance Value: 402 Ohm
Lead Channel Impedance Value: 747 Ohm
Lead Channel Setting Pacing Amplitude: 2.2 V
Lead Channel Setting Pacing Amplitude: 2.5 V
Lead Channel Setting Pacing Amplitude: 2.5 V
Lead Channel Setting Pacing Pulse Width: 0.6 ms
Lead Channel Setting Pacing Pulse Width: 1 ms
Lead Channel Setting Sensing Sensitivity: 0.5 mV
Lead Channel Setting Sensing Sensitivity: 1 mV
Pulse Gen Serial Number: 221486
Zone Setting Status: 755011

## 2023-10-10 ENCOUNTER — Other Ambulatory Visit (HOSPITAL_COMMUNITY): Payer: Self-pay | Admitting: Internal Medicine

## 2023-10-11 ENCOUNTER — Telehealth (HOSPITAL_COMMUNITY): Payer: Self-pay | Admitting: Internal Medicine

## 2023-10-24 ENCOUNTER — Other Ambulatory Visit (HOSPITAL_COMMUNITY): Payer: Self-pay | Admitting: Internal Medicine

## 2023-11-02 ENCOUNTER — Other Ambulatory Visit (HOSPITAL_COMMUNITY): Payer: Self-pay | Admitting: Internal Medicine

## 2023-11-16 NOTE — Addendum Note (Signed)
 Addended by: Lott Rouleau A on: 11/16/2023 03:10 PM   Modules accepted: Orders

## 2023-11-16 NOTE — Progress Notes (Signed)
 Remote ICD transmission.

## 2023-11-26 ENCOUNTER — Other Ambulatory Visit (HOSPITAL_COMMUNITY): Payer: Self-pay | Admitting: Internal Medicine

## 2023-12-10 NOTE — Progress Notes (Signed)
 Advanced [facility] Note   Date:  12/11/2023   ID:  Savannah Bell, DOB [birth year], MRN [id number]  Location: Home  Provider location: [place] [facility] Type of Visit: Established patient  PCP:  Garnet Just, FNP  Cardiologist:  Jules Oar, MD Primary HF: Savannah Bell  Chief Complaint: Heart Failure follow-up   History of Present Illness:  HPI: Savannah Bell is a 73 y.o. 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    Admitted 11/2017 with acute systolic HF. Echo EF 15%. R/LHC showed normal coronaries and normal LV pressures. cMRI showed EF 17% with no LGE. She was noted to have frequent PVC's on tele. Amio started.  We saw her in 2/20 and doing well but EF persistently 20% despite suppression of PVCs. Losartan  increased and referred for ICD, CPX and sleep study.  Underwent implant of BSci CRT-D in 3/20 by Dr. Carolynne Citron. CPX showed moderate to severe HF limitation. Home sleep study 4/20: No significant OSA. AHI 4.9  Echo 02/20/19 EF 25%  Personally reviewed  PYP 10/20 Read as strongly suggestive of TTR with H/CL of 1.65 but visually it is equivocal at worst (Reviewed by Dr. Julane Ny who felt study was negative).   Echo 05/22: EF 20-25%, RV okay, mild MR  Echo 11/23: EF 20-25%, RV normal   She is here today for 6 month f/u. Retired from the [employer] on February 14, 2023 after 42 years 5 months and 9 days!!!! Feels good. Working in her garden and Geophysical data processor. Walking 20-30 mins. No CP or SOB. No problem with meds  ICD interrogation: HL score 7. No VT/VF/AF. Activity level 1.3 hr/day. Personally reviewed  Cardiac studies:  Echo 11/23: EF 20-25%, RV normal  Echo 05/22: EF 20-25%, RV okay Echo 08/22/2018: EF 20%,  Echo 03/27/18 EF 20-25% but LV less dilated  CPX 08/26/18  FVC 2.31 (109%)      FEV1 1.85 (109%)        FEV1/FVC 80 (100%)        MVV 74 (95%)       Resting HR: 72 Peak HR: 137    (89% age predicted max HR) BP rest: 98/60 BP peak: 112/60 Peak VO2: 11.7 (64% predicted peak VO2) VE/VCO2 slope:  41 OUES: 0.75 Peak RER: 1.13 VE/MVV:  46% O2pulse:  6   (75% predicted O2pulse)     R/LHC 5/29: that showed normal coronary arteries, EF 10-20%, normal LV filling pressures (no right sided pressures recorded).    Cardiac MRI 12/13/17: EF 17%, findings consistent with NICM with no evidence of LGE to suggest inflammatory or infiltrative cardiomyopathy.  who presents via Web designer for a telehealth visit today.       Past Medical History:  Diagnosis Date   CHF (congestive heart failure) (HCC)    History of blood transfusion    "when I had colon resection" (12/11/2017)   Migraine    "stopped in the 1970s" (12/11/2017)   Mitral valve prolapse    Past Surgical History:  Procedure Laterality Date   ABDOMINAL HYSTERECTOMY  1998   APPENDECTOMY  2003   APPLICATION OF WOUND VAC  09/05/2011   Procedure: APPLICATION OF WOUND VAC;  Surgeon: Enid Harry, MD;  Location: [facility];  Service: General;  Laterality: N/A;   BIV ICD INSERTION CRT-D N/A 09/30/2018   Procedure: BIV ICD INSERTION CRT-D;  Surgeon: Tammie Fall, MD;  [facility];  Service: Cardiovascular;  Laterality: N/A;   BOWEL RESECTION  09/05/2011   Procedure: SMALL BOWEL RESECTION;  Surgeon: Enid Harry, MD;  Location: [facility];  Service: General;  Laterality: N/A;   BUNIONECTOMY WITH HAMMERTOE RECONSTRUCTION Bilateral    "& shortened toes"   CESAREAN SECTION  1972   COLON SURGERY     LAPAROTOMY  09/05/2011   Procedure: EXPLORATORY LAPAROTOMY;  Surgeon: Enid Harry, MD;  Location: [facility];  Service: General;  Laterality: N/A;   LEFT HEART CATH AND CORONARY ANGIOGRAPHY N/A 12/12/2017   Procedure: LEFT HEART CATH AND CORONARY ANGIOGRAPHY;  Surgeon: Arty Binning, MD;  [facility];  Service: Cardiovascular;  Laterality: N/A;   TUBAL LIGATION  1974     Current  Outpatient Medications  Medication Sig Dispense Refill   amiodarone  (PACERONE ) 100 MG tablet TAKE 1 TABLET BY MOUTH EVERY DAY 90 tablet 3   atorvastatin  (LIPITOR) 40 MG tablet TAKE 1 TABLET BY MOUTH EVERY DAY 90 tablet 3   calcium  carbonate (OS-CAL) 600 MG TABS tablet Take 1,200 mg by mouth daily with breakfast.     Calcium  Citrate-Vitamin D 500-12.5 MG-MCG PACK Take by mouth. vitaminD is separte rom calcium      carvedilol  (COREG ) 3.125 MG tablet TAKE 1 TABLET BY MOUTH 2 (TWO) TIMES DAILY WITH A MEAL. NEEDS FOLLOW UP APPOINTMENT FOR MORE REFILLS 180 tablet 0   dapagliflozin propanediol  (FARXIGA ) 10 MG TABS tablet TAKE 1 TABLET (10 MG TOTAL) BY MOUTH DAILY. MUST KEEP APPOINTMENT FOR FURTHER REFILLS 30 tablet 0   ENTRESTO  24-26 MG TAKE 1 TABLET BY MOUTH 2 TIMES DAILY. *NEEDS FOLLOW UP FOR ANYMORE REFILLS* 60 tablet 6   potassium chloride  SA (KLOR-CON ) 20 MEQ tablet Take 1 tablet (20 mEq total) by mouth daily as needed (when you take Furosemide ). 10 tablet 3   No current facility-administered medications for this encounter.    Allergies:   Patient has no known allergies.   Social History:  The patient  reports that she quit smoking about 42 years ago. Her smoking use included cigarettes. She started smoking about 52 years ago. She has a 5 pack-year smoking history. She quit smokeless tobacco use about 25 years ago.  Her smokeless tobacco use included snuff and chew. She reports that she does not currently use alcohol. She reports that she does not use drugs.   Family History:  The patient's family history includes Sudden Cardiac Death in her mother.   ROS:  Please see the history of present illness.   All other systems are personally reviewed and negative.   Vitals:   12/11/23 0848  BP: 110/70  Pulse: 66  SpO2: 99%   Wt Readings from Last 3 Encounters:  06/13/23 77.8 kg (171 lb 9.6 oz)  12/28/22 76.2 kg (168 lb)  06/07/22 77 kg (169 lb 12.8 oz)    PHYSICAL EXAM: General:  Well  appearing. No resp difficulty HEENT: normal Neck: supple. no JVD. Carotids 2+ bilat; no bruits. No lymphadenopathy or thryomegaly appreciated. Cor: PMI nondisplaced. Regular rate & rhythm. No rubs, gallops or murmurs. Lungs: clear Abdomen: soft, nontender, nondistended. No hepatosplenomegaly. No bruits or masses. Good bowel sounds. Extremities: no cyanosis, clubbing, rash, edema Neuro: alert & orientedx3, cranial nerves grossly intact. moves all 4 extremities w/o difficulty. Affect pleasant   Recent Labs: No results found for requested labs within last 365 days.  Personally reviewed   Wt Readings from Last 3 Encounters:  06/13/23 77.8 kg (  171 lb 9.6 oz)  12/28/22 76.2 kg (168 lb)  06/07/22 77 kg (169 lb 12.8 oz)      ASSESSMENT AND PLAN:  1. Chronic systolic HF due to NICM.  -Normal coronaries on [facility] 12/11/17. cMRI shows no infiltrative or inflammatory CM. No family hx of HF. No ETOH. No hx of HTN. No recent virus. Frequent PVCs remains only real smoking gun but we have suppressed PVCs and EF still down. Unable to get cMRI due to ICD - Echo 12/12/17 EF 15% with grade 1 DD, no LV thrombus, mild MR, trivial TR, PA peak pressure 31 mmHg, and trivial pericardial effusion.  - Echo 03/27/18 EF 20-25% but LV less dilated - cMRI 5/19 EF 17% no LGE - Echo 08/22/2018 EF remains low ~20% - s/p BSCi CRT-D in 3/20 - Echo 02/20/19 EF 25% Mildly reduced RV function - PYP 10/20 Read as strongly suggestive of TTR with H/CL of 1.65 but visually it is equivocal at worst (I think negative) - CPX 2/20 BP rest: 98/60 BP peak: 112/60 Peak VO2: 11.7 (64% predicted peak VO2) VE/VCO2 slope: 41 Peak RER: 1.13 - Echo 05/22: EF 20-25%, RV okay - Echo 11/23: EF 20-25%, RV ok  - Stable NYHA I-II symptoms despite severe LV dysfunction and CPX test 2/20 showing significant HF limitation. - Volume ok. Does not require loop diuretic HL score 7. No VT/VF/AF. Activity level 1.3 hr/day. Personally reviewed - Continue  Entresto  24/26 bid. Has failed titration in past - Continue spiro 25 mg daily.  - Continue coreg  3.125 mg BID. Intolerant to up-titration. - Continue Farxiga  10mg  daily. Denies GU symptoms  - On good GDMT. Has not tolerated titration  - So far EF has not improved with PVC suppression.  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 Pt triggered events all NSR.  - Will switch amio to mexilitene 200 bid and follow PVC burden  - Recent amio labs ok. Check CXR.  3. Hyperlipidemia  - Continue statin therapy. - PCP following   4. Snoring - Sleep study negative (AHI 4.9)   5. CKD IIIb - b/l Scr ~1.6 - on Farxiga  - Following with Nephrology. Recent labs stable  Signed, Jules Oar, MD  12/11/2023 9:27 AM  [facility] [address] [facility] [place] [address] [phone] (office) [phone] (fax)

## 2023-12-11 ENCOUNTER — Ambulatory Visit (HOSPITAL_COMMUNITY)
Admission: RE | Admit: 2023-12-11 | Discharge: 2023-12-11 | Disposition: A | Discharge status: Home / Self Care | Source: Ambulatory Visit | Attending: Internal Medicine | Admitting: Internal Medicine

## 2023-12-11 ENCOUNTER — Encounter (HOSPITAL_COMMUNITY): Payer: Self-pay | Admitting: Internal Medicine

## 2023-12-11 VITALS — BP 110/70 | HR 66

## 2023-12-11 DIAGNOSIS — Z7984 Long term (current) use of oral hypoglycemic drugs: Secondary | ICD-10-CM | POA: Diagnosis not present

## 2023-12-11 DIAGNOSIS — Z4502 Encounter for adjustment and management of automatic implantable cardiac defibrillator: Secondary | ICD-10-CM | POA: Insufficient documentation

## 2023-12-11 DIAGNOSIS — I5022 Chronic systolic (congestive) heart failure: Secondary | ICD-10-CM | POA: Insufficient documentation

## 2023-12-11 DIAGNOSIS — I428 Other cardiomyopathies: Secondary | ICD-10-CM | POA: Insufficient documentation

## 2023-12-11 DIAGNOSIS — R9431 Abnormal electrocardiogram [ECG] [EKG]: Secondary | ICD-10-CM | POA: Insufficient documentation

## 2023-12-11 DIAGNOSIS — R0683 Snoring: Secondary | ICD-10-CM | POA: Insufficient documentation

## 2023-12-11 DIAGNOSIS — Z87891 Personal history of nicotine dependence: Secondary | ICD-10-CM | POA: Insufficient documentation

## 2023-12-11 DIAGNOSIS — N1832 Chronic kidney disease, stage 3b: Secondary | ICD-10-CM | POA: Diagnosis not present

## 2023-12-11 DIAGNOSIS — Z79899 Other long term (current) drug therapy: Secondary | ICD-10-CM | POA: Diagnosis not present

## 2023-12-11 DIAGNOSIS — I493 Ventricular premature depolarization: Secondary | ICD-10-CM | POA: Insufficient documentation

## 2023-12-11 DIAGNOSIS — I4510 Unspecified right bundle-branch block: Secondary | ICD-10-CM | POA: Diagnosis not present

## 2023-12-11 DIAGNOSIS — Z95810 Presence of automatic (implantable) cardiac defibrillator: Secondary | ICD-10-CM | POA: Diagnosis not present

## 2023-12-11 DIAGNOSIS — E785 Hyperlipidemia, unspecified: Secondary | ICD-10-CM | POA: Insufficient documentation

## 2023-12-11 MED ORDER — MEXILETINE HCL 200 MG PO CAPS: 200.0000 mg | ORAL_CAPSULE | Freq: Two times a day (BID) | ORAL | 3 refills | Status: DC

## 2023-12-11 NOTE — Patient Instructions (Addendum)
 Medication Changes:  STOP AMIODARONE    START: MEXILETINE 200MG  TWICE DAILY    Testing/Procedures:  PLEASE GO TODAY FOR 2 VIEW CHEST XRAY HERE AT [facility]   Follow-Up in: 6 MONTHS PLEASE CALL OUR OFFICE AROUND SEPTEMBER  TO GET SCHEDULED FOR YOUR APPOINTMENT. PHONE NUMBER IS [phone] OPTION 2   At the [facility], you and your health needs are our priority. We have a designated team specialized in the treatment of Heart Failure. This Care Team includes your primary Heart Failure Specialized Cardiologist (physician), Advanced Practice Providers (APPs- Physician Assistants and Nurse Practitioners), and Pharmacist who all work together to provide you with the care you need, when you need it.   You may see any of the following providers on your designated Care Team at your next follow up:  Dr. Jules Oar Dr. Peder Bourdon Dr. Alwin Baars Dr. Judyth Nunnery Nieves Bars, NP Ruddy Corral, [place] [facility] [place], [place] Dennise Fitz, NP [place] Lee, NP Luster Salters, PharmD   Please be sure to bring in all your medications bottles to every appointment.   Need to Contact Us :  If you have any questions or concerns before your next appointment please send us  a message through [facility] or call our office at [phone].    TO LEAVE A MESSAGE FOR THE NURSE SELECT OPTION 2, PLEASE LEAVE A MESSAGE INCLUDING: YOUR NAME DATE OF BIRTH CALL BACK NUMBER REASON FOR CALL**this is important as we prioritize the call backs  YOU WILL RECEIVE A CALL BACK THE SAME DAY AS LONG AS YOU CALL BEFORE 4:00 PM

## 2023-12-28 ENCOUNTER — Encounter: Payer: Self-pay | Admitting: Cardiology

## 2024-01-01 ENCOUNTER — Ambulatory Visit (INDEPENDENT_AMBULATORY_CARE_PROVIDER_SITE_OTHER): Payer: 59

## 2024-01-01 DIAGNOSIS — I442 Atrioventricular block, complete: Secondary | ICD-10-CM

## 2024-01-01 LAB — CUP PACEART REMOTE DEVICE CHECK
Battery Remaining Longevity: 60 mo
Battery Remaining Percentage: 68 %
Brady Statistic RA Percent Paced: 0 %
Brady Statistic RV Percent Paced: 99 %
Date Time Interrogation Session: 20250616032200
HighPow Impedance: 80 Ohm
Implantable Lead Connection Status: 753985
Implantable Lead Connection Status: 753985
Implantable Lead Connection Status: 753985
Implantable Lead Implant Date: 20200316
Implantable Lead Implant Date: 20200316
Implantable Lead Implant Date: 20200316
Implantable Lead Location: 753858
Implantable Lead Location: 753859
Implantable Lead Location: 753860
Implantable Lead Model: 292
Implantable Lead Model: 4671
Implantable Lead Model: 7740
Implantable Lead Serial Number: 1013232
Implantable Lead Serial Number: 446892
Implantable Lead Serial Number: 827624
Implantable Pulse Generator Implant Date: 20200316
Lead Channel Impedance Value: 1011 Ohm
Lead Channel Impedance Value: 408 Ohm
Lead Channel Impedance Value: 774 Ohm
Lead Channel Setting Pacing Amplitude: 2.2 V
Lead Channel Setting Pacing Amplitude: 2.5 V
Lead Channel Setting Pacing Amplitude: 2.5 V
Lead Channel Setting Pacing Pulse Width: 0.6 ms
Lead Channel Setting Pacing Pulse Width: 1 ms
Lead Channel Setting Sensing Sensitivity: 0.5 mV
Lead Channel Setting Sensing Sensitivity: 1 mV
Pulse Gen Serial Number: 221486
Zone Setting Status: 755011

## 2024-01-03 ENCOUNTER — Ambulatory Visit: Payer: Self-pay | Admitting: Internal Medicine

## 2024-01-29 ENCOUNTER — Ambulatory Visit (HOSPITAL_COMMUNITY)

## 2024-01-31 ENCOUNTER — Other Ambulatory Visit (HOSPITAL_COMMUNITY): Payer: Self-pay | Admitting: Internal Medicine

## 2024-02-18 ENCOUNTER — Ambulatory Visit (HOSPITAL_COMMUNITY)
Admission: RE | Admit: 2024-02-18 | Discharge: 2024-02-18 | Disposition: A | Discharge status: Home / Self Care | Source: Ambulatory Visit | Attending: Internal Medicine | Admitting: Internal Medicine

## 2024-02-18 DIAGNOSIS — I5022 Chronic systolic (congestive) heart failure: Secondary | ICD-10-CM | POA: Diagnosis not present

## 2024-02-18 DIAGNOSIS — I429 Cardiomyopathy, unspecified: Secondary | ICD-10-CM | POA: Diagnosis not present

## 2024-02-18 DIAGNOSIS — I252 Old myocardial infarction: Secondary | ICD-10-CM | POA: Diagnosis not present

## 2024-02-18 LAB — ECHOCARDIOGRAM COMPLETE
Area-P 1/2: 4.31 cm2
Calc EF: 6.2 %
S' Lateral: 5.6 cm
Single Plane A2C EF: 13.9 %
Single Plane A4C EF: -10.1 %

## 2024-03-08 ENCOUNTER — Other Ambulatory Visit (HOSPITAL_COMMUNITY): Payer: Self-pay | Admitting: Internal Medicine

## 2024-03-09 ENCOUNTER — Ambulatory Visit (HOSPITAL_COMMUNITY): Payer: Self-pay | Admitting: Internal Medicine

## 2024-03-13 NOTE — Progress Notes (Signed)
Remote ICD Transmission.

## 2024-03-18 ENCOUNTER — Encounter (HOSPITAL_COMMUNITY): Payer: Self-pay

## 2024-03-18 ENCOUNTER — Ambulatory Visit (HOSPITAL_COMMUNITY)
Admission: RE | Admit: 2024-03-18 | Discharge: 2024-03-18 | Disposition: A | Discharge status: Home / Self Care | Source: Ambulatory Visit | Attending: Cardiology | Admitting: Cardiology

## 2024-03-18 ENCOUNTER — Other Ambulatory Visit (HOSPITAL_COMMUNITY): Payer: Self-pay

## 2024-03-18 ENCOUNTER — Telehealth (HOSPITAL_COMMUNITY): Payer: Self-pay

## 2024-03-18 VITALS — BP 116/76 | HR 102 | Ht 59.0 in | Wt 165.6 lb

## 2024-03-18 DIAGNOSIS — E875 Hyperkalemia: Secondary | ICD-10-CM | POA: Diagnosis not present

## 2024-03-18 DIAGNOSIS — Z7901 Long term (current) use of anticoagulants: Secondary | ICD-10-CM | POA: Insufficient documentation

## 2024-03-18 DIAGNOSIS — I428 Other cardiomyopathies: Secondary | ICD-10-CM | POA: Insufficient documentation

## 2024-03-18 DIAGNOSIS — I5022 Chronic systolic (congestive) heart failure: Secondary | ICD-10-CM | POA: Diagnosis present

## 2024-03-18 DIAGNOSIS — E785 Hyperlipidemia, unspecified: Secondary | ICD-10-CM | POA: Diagnosis not present

## 2024-03-18 DIAGNOSIS — N1832 Chronic kidney disease, stage 3b: Secondary | ICD-10-CM | POA: Insufficient documentation

## 2024-03-18 DIAGNOSIS — I493 Ventricular premature depolarization: Secondary | ICD-10-CM | POA: Diagnosis not present

## 2024-03-18 DIAGNOSIS — I513 Intracardiac thrombosis, not elsewhere classified: Secondary | ICD-10-CM

## 2024-03-18 DIAGNOSIS — Z79899 Other long term (current) drug therapy: Secondary | ICD-10-CM | POA: Insufficient documentation

## 2024-03-18 DIAGNOSIS — Z87891 Personal history of nicotine dependence: Secondary | ICD-10-CM | POA: Diagnosis not present

## 2024-03-18 DIAGNOSIS — R0683 Snoring: Secondary | ICD-10-CM | POA: Diagnosis not present

## 2024-03-18 DIAGNOSIS — Z4502 Encounter for adjustment and management of automatic implantable cardiac defibrillator: Secondary | ICD-10-CM | POA: Insufficient documentation

## 2024-03-18 MED ORDER — APIXABAN 5 MG PO TABS: 5.0000 mg | ORAL_TABLET | Freq: Two times a day (BID) | ORAL | 3 refills | Status: AC

## 2024-03-18 NOTE — Progress Notes (Addendum)
 Advanced [facility] Failure Clinic Note   Date:  03/18/2024   ID:  Savannah Bell, DOB 09-Mar-1951, MRN [id number]  Location: Home  Provider location: [facility] Type of Visit: Established patient  PCP:  Mavis Redge SAILOR, FNP  Cardiologist:  Toribio Fuel, MD Primary HF: Amit Leece  Chief Complaint: Heart Failure follow-up   History of Present Illness:  HPI: Savannah Bell is a 73 y.o. 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    Admitted 11/2017 with acute systolic HF. Echo EF 15%. R/LHC showed normal coronaries and normal LV pressures. cMRI showed EF 17% with no LGE. She was noted to have frequent PVC's on tele. Amio started.  We saw her in 2/20 and doing well but EF persistently 20% despite suppression of PVCs. Losartan  increased and referred for ICD, CPX and sleep study.  Underwent implant of BSci CRT-D in 3/20 by Dr. Waddell. CPX showed moderate to severe HF limitation. Home sleep study 4/20: No significant OSA. AHI 4.9  Echo 02/20/19 EF 25%  Personally reviewed  PYP 10/20 Read as strongly suggestive of TTR with H/CL of 1.65 but visually it is equivocal at worst (Reviewed by Dr. Fuel who felt study was negative).   Echo 05/22: EF 20-25%, RV okay, mild MR  Echo 11/23: EF 20-25%, RV normal   Echo 02/18/24 showed EF 20-25% + large 2.1 x 2.5cm circumscribed LV thrombus adherent to the basilar antero-lateral wall, global HK, RV normal.   She presents today for f/u and to discuss echo results.   She is doing well. NYHA I-II. Euvolemic on exam and device interrogation. HL score 1. No AF/AT. No VT/VF. Activity level 1.4hr/day. 99% LV pacing.   BP well controlled, 116/76. Denies CP.   Retired from the [employer] on February 14, 2023 after 42 years.   ICD interrogation: HL score 1. No VT/VF/AF. Activity level 1.4 hr/day. 99% BiV pacing. Personally reviewed  Cardiac studies:  Echo 8/25: EF  20-25%, RV nl. ? LV thrombus  Echo 11/23: EF 20-25%, RV normal  Echo 05/22: EF 20-25%, RV okay Echo 08/22/2018: EF 20%,  Echo 03/27/18 EF 20-25% but LV less dilated  CPX 08/26/18  FVC 2.31 (109%)      FEV1 1.85 (109%)        FEV1/FVC 80 (100%)        MVV 74 (95%)       Resting HR: 72 Peak HR: 137   (89% age predicted max HR) BP rest: 98/60 BP peak: 112/60 Peak VO2: 11.7 (64% predicted peak VO2) VE/VCO2 slope:  41 OUES: 0.75 Peak RER: 1.13 VE/MVV:  46% O2pulse:  6   (75% predicted O2pulse)     R/LHC 5/29: that showed normal coronary arteries, EF 10-20%, normal LV filling pressures (no right sided pressures recorded).    Cardiac MRI 12/13/17: EF 17%, findings consistent with NICM with no evidence of LGE to suggest inflammatory or infiltrative cardiomyopathy.  who presents via Web designer for a telehealth visit today.       Past Medical History:  Diagnosis Date   CHF (congestive heart failure) (HCC)    History of blood transfusion    when I had colon resection (12/11/2017)   Migraine    stopped in the 1970s (12/11/2017)   Mitral valve prolapse    Past Surgical History:  Procedure Laterality Date   ABDOMINAL HYSTERECTOMY  1998   APPENDECTOMY  2003   APPLICATION OF WOUND VAC  09/05/2011   Procedure: APPLICATION OF WOUND VAC;  Surgeon: Donnice Bury, MD;  Location: [facility];  Service: General;  Laterality: N/A;   BIV ICD INSERTION CRT-D N/A 09/30/2018   Procedure: BIV ICD INSERTION CRT-D;  Surgeon: Waddell Danelle ORN, MD;  Location: [facility] INVASIVE CV LAB;  Service: Cardiovascular;  Laterality: N/A;   BOWEL RESECTION  09/05/2011   Procedure: SMALL BOWEL RESECTION;  Surgeon: Donnice Bury, MD;  Location: [facility];  Service: General;  Laterality: N/A;   BUNIONECTOMY WITH HAMMERTOE RECONSTRUCTION Bilateral    & shortened toes   CESAREAN SECTION  1972   COLON SURGERY     LAPAROTOMY  09/05/2011   Procedure: EXPLORATORY LAPAROTOMY;  Surgeon: Donnice Bury, MD;   Location: [facility];  Service: General;  Laterality: N/A;   LEFT HEART CATH AND CORONARY ANGIOGRAPHY N/A 12/12/2017   Procedure: LEFT HEART CATH AND CORONARY ANGIOGRAPHY;  Surgeon: Claudene Victory ORN, MD;  Location: [facility] INVASIVE CV LAB;  Service: Cardiovascular;  Laterality: N/A;   TUBAL LIGATION  1974     Current Outpatient Medications  Medication Sig Dispense Refill   atorvastatin  (LIPITOR) 40 MG tablet TAKE 1 TABLET BY MOUTH EVERY DAY 90 tablet 3   calcium  carbonate (OS-CAL) 600 MG TABS tablet Take 1,200 mg by mouth daily with breakfast.     Calcium  Citrate-Vitamin D 500-12.5 MG-MCG PACK Take by mouth. vitaminD is separte rom calcium      carvedilol  (COREG ) 3.125 MG tablet TAKE 1 TABLET BY MOUTH 2 (TWO) TIMES DAILY WITH A MEAL. NEEDS FOLLOW UP APPOINTMENT FOR MORE REFILLS 180 tablet 0   dapagliflozin  propanediol (FARXIGA ) 10 MG TABS tablet TAKE 1 TABLET (10 MG TOTAL) BY MOUTH DAILY. MUST KEEP APPOINTMENT FOR FURTHER REFILLS 30 tablet 0   ENTRESTO  24-26 MG TAKE 1 TABLET BY MOUTH 2 TIMES DAILY. *NEEDS FOLLOW UP FOR ANYMORE REFILLS* 60 tablet 6   mexiletine (MEXITIL) 200 MG capsule TAKE 1 CAPSULE BY MOUTH TWICE A DAY 180 capsule 1   potassium chloride  SA (KLOR-CON ) 20 MEQ tablet Take 1 tablet (20 mEq total) by mouth daily as needed (when you take Furosemide ). 10 tablet 3   No current facility-administered medications for this encounter.    Allergies:   Patient has no known allergies.   Social History:  The patient  reports that she quit smoking about 42 years ago. Her smoking use included cigarettes. She started smoking about 52 years ago. She has a 5 pack-year smoking history. She quit smokeless tobacco use about 25 years ago.  Her smokeless tobacco use included snuff and chew. She reports that she does not currently use alcohol. She reports that she does not use drugs.   Family History:  The patient's family history includes Sudden Cardiac Death in her mother.   ROS:  Please see the history of  present illness.   All other systems are personally reviewed and negative.   Vitals:   03/18/24 1133  BP: 116/76  Pulse: (!) 102  SpO2: 97%  Weight: 75.1 kg (165 lb 9.6 oz)  Height: 4' 11 (1.499 m)    Wt Readings from Last 3 Encounters:  03/18/24 75.1 kg (165 lb 9.6 oz)  06/13/23 77.8 kg (171 lb 9.6 oz)  12/28/22 76.2 kg (168 lb)    Physical Exam  GENERAL: NAD Lungs- clear  CARDIAC:  JVP not elevated         Normal rate with regular rhythm. No edema.  ABDOMEN: Soft, non-tender,  non-distended.  EXTREMITIES: Warm and well perfused.  NEUROLOGIC: No obvious FND   Recent Labs: No results found for requested labs within last 365 days.  Personally reviewed   Wt Readings from Last 3 Encounters:  03/18/24 75.1 kg (165 lb 9.6 oz)  06/13/23 77.8 kg (171 lb 9.6 oz)  12/28/22 76.2 kg (168 lb)      ASSESSMENT AND PLAN:  1. Chronic systolic HF due to NICM.  -Normal coronaries on [facility] 12/11/17. cMRI shows no infiltrative or inflammatory CM. No family hx of HF. No ETOH. No hx of HTN. No recent virus. Frequent PVCs remains only real smoking gun but we have suppressed PVCs and EF still down. Unable to get cMRI due to ICD - Echo 12/12/17 EF 15% with grade 1 DD, no LV thrombus, mild MR, trivial TR, PA peak pressure 31 mmHg, and trivial pericardial effusion.  - Echo 03/27/18 EF 20-25% but LV less dilated - cMRI 5/19 EF 17% no LGE - Echo 08/22/2018 EF remains low ~20% - s/p BSCi CRT-D in 3/20 - Echo 02/20/19 EF 25% Mildly reduced RV function - PYP 10/20 Read as strongly suggestive of TTR with H/CL of 1.65 but visually it is equivocal at worst (I think negative) - CPX 2/20 BP rest: 98/60 BP peak: 112/60 Peak VO2: 11.7 (64% predicted peak VO2) VE/VCO2 slope: 41 Peak RER: 1.13 - Echo 05/22: EF 20-25%, RV okay - Echo 11/23: EF 20-25%, RV ok  - Echo 8/25 EF 20-25%, RV ok. + ?Large LV Thrombus  - Stable NYHA I-II symptoms despite severe LV dysfunction and CPX test 2/20 showing significant HF  limitation. - Euvolemic on exam and device interrogation. HL Score 1. No VT/VF - Continue Entresto  24/26 bid. Has failed titration in past - Off spiro w/ hyperkalemia (most recent K 5.0)  - Continue coreg  3.125 mg BID. Intolerant to up-titration. - Continue Farxiga  10mg  daily.  - Does not need daily loop diuretic  - Labs followed by [facility]. Recently done. I personally reviewed, SCr and K both stable  - So far EF has not improved with PVC suppression.    2. 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 Offered pre-medication w/ valium but pt still declined.  - will plan anticoagulation and plan repeat echo in 3 months. If mass still present, will push for cMRI   3. Frequent PVCs - ~20% burden on tele initially - Zio Patch 03/27/18 with sinus rhythm with RBBB, Rare PVCs, No high-grade arrythmias or pauses. Pt triggered events all NSR.  - Now Mexiletine 200 bid. Device interrogation shows ? 99% LV pacing, suggesting PVC burden likely not significant - continue Mexilitine 200 mg bid     4. Hyperlipidemia  - on Atorva 40 mg daily  - Managed by PCP    5. Snoring - Sleep study negative (AHI 4.9)   6. CKD IIIb - b/l Scr ~1.6 - on Farxiga  - Followed by Dr. Tobie w/ [facility]. Recent labs personally reviewed and stable  F/u w/ Dr. Cherrie w/ repeat echo in 3 months   Signed, Caffie Shed, PA-C  03/18/2024 12:00 PM  [facility] [address] [facility] [place] [address] [phone] (office) [phone] (fax)  Patient seen and examined with  the above-signed Systems developer and/or Housestaff. I personally reviewed laboratory data, imaging studies and relevant notes. I  independently examined the patient and formulated the important aspects of the plan. I have edited the note to reflect any of my changes or salient points. I have personally discussed the plan with the patient and/or family.   73 y/o woman with systolic HF due to NICM, PVCs, CKD 3b brought to cli ic to discuss recent echo vistis showing probable LV thrombus associated with lateral wall.   Says she feels great. Denies CP or SOB. No edema, orthopnea or PND. Compliant with med  General:  Well appearing. No resp difficulty HEENT: normal Neck: supple. no JVD. Carotids 2+ bilat; no bruits. No lymphadenopathy or thryomegaly appreciated. Cor: PMI nondisplaced. Regular rate & rhythm. No rubs, gallops or murmurs. Lungs: clear Abdomen: soft, nontender, nondistended. No hepatosplenomegaly. No bruits or masses. Good bowel sounds. Extremities: no cyanosis, clubbing, rash, edema Neuro: alert & orientedx3, cranial nerves grossly intact. moves all 4 extremities w/o difficulty. Affect pleasant  Echo reviewed personally. Abnormality on echo is very well circumscribed and attached to high lateral wall which seems to be an odd place for LV clot.   I suggested getting cMRI to further delineate but she refuses due to claustrophobia.   Will begin [facility] and re-image in 3 months. If no change can consider cardiac CT or TEE to further evaluate.   Toribio Fuel, MD  11:11 AM

## 2024-03-18 NOTE — Telephone Encounter (Signed)
 Advanced Heart Failure Patient Advocate Encounter  Test billing for this patient's current coverage ([employer]) returns $0 copay for 90 day supply of Eliquis .  This test claim was processed through [facility]- copay amounts may vary at other pharmacies due to pharmacy/plan contracts, or as the patient moves through the different stages of their insurance plan.  Rachel DEL, CPhT Rx Patient Advocate Phone: [phone]

## 2024-03-18 NOTE — Patient Instructions (Signed)
 START Eliquis  5 mg Twice daily  Your physician has requested that you have an echocardiogram. Echocardiography is a painless test that uses sound waves to create images of your heart. It provides your doctor with information about the size and shape of your heart and how well your heart's chambers and valves are working. This procedure takes approximately one hour. There are no restrictions for this procedure. Please do NOT wear cologne, perfume, aftershave, or lotions (deodorant is allowed). Please arrive 15 minutes prior to your appointment time.  Please note: We ask at that you not bring children with you during ultrasound (echo/ vascular) testing. Due to room size and safety concerns, children are not allowed in the ultrasound rooms during exams. Our front office staff cannot provide observation of children in our lobby area while testing is being conducted. An adult accompanying a patient to their appointment will only be allowed in the ultrasound room at the discretion of the ultrasound technician under special circumstances. We apologize for any inconvenience.  Your physician recommends that you schedule a follow-up appointment in: 3 months with an echocardiogram ( December) ** PLEASE CALL THE OFFICE IN OCTOBER TO ARRANGE YOUR FOLLOW UP APPOINTMENT.**  If you have any questions or concerns before your next appointment please send us  a message through [facility] or call our office at [phone].    TO LEAVE A MESSAGE FOR THE NURSE SELECT OPTION 2, PLEASE LEAVE A MESSAGE INCLUDING: YOUR NAME DATE OF BIRTH CALL BACK NUMBER REASON FOR CALL**this is important as we prioritize the call backs  YOU WILL RECEIVE A CALL BACK THE SAME DAY AS LONG AS YOU CALL BEFORE 4:00 PM  At the [facility] Clinic, you and your health needs are our priority. As part of our continuing mission to provide you with exceptional heart care, we have created designated Provider Care Teams. These Care Teams  include your primary Cardiologist (physician) and Advanced Practice Providers (APPs- Physician Assistants and Nurse Practitioners) who all work together to provide you with the care you need, when you need it.   You may see any of the following providers on your designated Care Team at your next follow up: Dr Toribio Fuel Dr Ezra Shuck Dr. Ria Commander Dr. Morene Brownie Amy Lenetta, NP Caffie Shed, [place] [facility], [place] Beckey Coe, NP [place] Lee, NP Ellouise Class, NP Tinnie Redman, PharmD Jaun Bash, PharmD   Please be sure to bring in all your medications bottles to every appointment.    Thank you for choosing [facility]

## 2024-03-24 ENCOUNTER — Other Ambulatory Visit (HOSPITAL_COMMUNITY): Payer: Self-pay | Admitting: Internal Medicine

## 2024-03-25 ENCOUNTER — Other Ambulatory Visit (HOSPITAL_COMMUNITY): Payer: Self-pay | Admitting: Cardiology

## 2024-03-25 ENCOUNTER — Encounter: Payer: Self-pay | Admitting: Internal Medicine

## 2024-03-25 MED ORDER — SACUBITRIL-VALSARTAN 24-26 MG PO TABS: 1.0000 | ORAL_TABLET | Freq: Two times a day (BID) | ORAL | 6 refills | Status: AC

## 2024-03-25 MED ORDER — DAPAGLIFLOZIN PROPANEDIOL 10 MG PO TABS: 10.0000 mg | ORAL_TABLET | Freq: Every day | ORAL | 11 refills | Status: AC

## 2024-04-01 ENCOUNTER — Ambulatory Visit (INDEPENDENT_AMBULATORY_CARE_PROVIDER_SITE_OTHER): Payer: 59

## 2024-04-01 DIAGNOSIS — I5022 Chronic systolic (congestive) heart failure: Secondary | ICD-10-CM

## 2024-04-02 LAB — CUP PACEART REMOTE DEVICE CHECK
Battery Remaining Longevity: 54 mo
Battery Remaining Percentage: 64 %
Brady Statistic RA Percent Paced: 0 %
Brady Statistic RV Percent Paced: 99 %
Date Time Interrogation Session: 20250916032000
HighPow Impedance: 72 Ohm
Implantable Lead Connection Status: 753985
Implantable Lead Connection Status: 753985
Implantable Lead Connection Status: 753985
Implantable Lead Implant Date: 20200316
Implantable Lead Implant Date: 20200316
Implantable Lead Implant Date: 20200316
Implantable Lead Location: 753858
Implantable Lead Location: 753859
Implantable Lead Location: 753860
Implantable Lead Model: 292
Implantable Lead Model: 4671
Implantable Lead Model: 7740
Implantable Lead Serial Number: 1013232
Implantable Lead Serial Number: 446892
Implantable Lead Serial Number: 827624
Implantable Pulse Generator Implant Date: 20200316
Lead Channel Impedance Value: 410 Ohm
Lead Channel Impedance Value: 761 Ohm
Lead Channel Impedance Value: 972 Ohm
Lead Channel Setting Pacing Amplitude: 2.2 V
Lead Channel Setting Pacing Amplitude: 2.5 V
Lead Channel Setting Pacing Amplitude: 2.5 V
Lead Channel Setting Pacing Pulse Width: 0.6 ms
Lead Channel Setting Pacing Pulse Width: 1 ms
Lead Channel Setting Sensing Sensitivity: 0.5 mV
Lead Channel Setting Sensing Sensitivity: 1 mV
Pulse Gen Serial Number: 221486
Zone Setting Status: 755011

## 2024-04-04 ENCOUNTER — Ambulatory Visit: Payer: Self-pay | Admitting: Internal Medicine

## 2024-04-07 NOTE — Progress Notes (Signed)
Remote ICD Transmission.

## 2024-04-17 ENCOUNTER — Other Ambulatory Visit (HOSPITAL_COMMUNITY): Payer: Self-pay | Admitting: Internal Medicine

## 2024-06-11 ENCOUNTER — Encounter: Payer: Self-pay | Admitting: Cardiology

## 2024-06-11 ENCOUNTER — Ambulatory Visit: Attending: Physician Assistant | Admitting: Cardiology

## 2024-06-11 VITALS — BP 97/63 | HR 74 | Ht 59.0 in | Wt 165.0 lb

## 2024-06-11 DIAGNOSIS — I428 Other cardiomyopathies: Secondary | ICD-10-CM

## 2024-06-11 DIAGNOSIS — I513 Intracardiac thrombosis, not elsewhere classified: Secondary | ICD-10-CM

## 2024-06-11 DIAGNOSIS — I5022 Chronic systolic (congestive) heart failure: Secondary | ICD-10-CM

## 2024-06-11 DIAGNOSIS — I493 Ventricular premature depolarization: Secondary | ICD-10-CM

## 2024-06-11 LAB — CUP PACEART INCLINIC DEVICE CHECK
Date Time Interrogation Session: 20251126113637
HighPow Impedance: 77 Ohm
Implantable Lead Connection Status: 753985
Implantable Lead Connection Status: 753985
Implantable Lead Connection Status: 753985
Implantable Lead Implant Date: 20200316
Implantable Lead Implant Date: 20200316
Implantable Lead Implant Date: 20200316
Implantable Lead Location: 753858
Implantable Lead Location: 753859
Implantable Lead Location: 753860
Implantable Lead Model: 292
Implantable Lead Model: 4671
Implantable Lead Model: 7740
Implantable Lead Serial Number: 1013232
Implantable Lead Serial Number: 446892
Implantable Lead Serial Number: 827624
Implantable Pulse Generator Implant Date: 20200316
Lead Channel Impedance Value: 1013 Ohm
Lead Channel Impedance Value: 415 Ohm
Lead Channel Impedance Value: 786 Ohm
Lead Channel Pacing Threshold Amplitude: 0.9 V
Lead Channel Pacing Threshold Amplitude: 1.2 V
Lead Channel Pacing Threshold Amplitude: 1.4 V
Lead Channel Pacing Threshold Pulse Width: 0.4 ms
Lead Channel Pacing Threshold Pulse Width: 0.6 ms
Lead Channel Pacing Threshold Pulse Width: 1 ms
Lead Channel Sensing Intrinsic Amplitude: 6.1 mV
Lead Channel Setting Pacing Amplitude: 2.4 V
Lead Channel Setting Pacing Amplitude: 2.5 V
Lead Channel Setting Pacing Amplitude: 2.5 V
Lead Channel Setting Pacing Pulse Width: 0.6 ms
Lead Channel Setting Pacing Pulse Width: 1 ms
Lead Channel Setting Sensing Sensitivity: 0.5 mV
Lead Channel Setting Sensing Sensitivity: 1 mV
Pulse Gen Serial Number: 221486
Zone Setting Status: 755011

## 2024-06-11 NOTE — Progress Notes (Signed)
  Electrophysiology Office Note:   ID:  Savannah, Bell [birth year], MRN [id number]  Primary Cardiologist: Toribio Fuel, MD Electrophysiologist: Danelle Birmingham, MD      History of Present Illness:   Savannah Bell is a 73 y.o. female with h/o HFrEF, NICM s/p CRT-D, frequent PVCs, CKD IIIb seen today for routine electrophysiology followup.   Patient admitted and found with acute systolic HF in 2019. Ischemic evaluation at that time negative. Telemetry with frequent PVCs and she started on Amiodarone . Despite compliance with GDMT and PVC suppression, EF remained low, 20%. Patient underwent implant of [employer] CRT-D in March 2020 with Dr. Birmingham.   Patient was switched from Amiodarone  to Mexiletine by Dr. Bensimhon in May of this year.   Most recent echo from August 2025 showed LVEF 20-25%, large circumscribed LV thrombus adherent to basilar anterolateral wall. Patient started on Eliquis  by Dr. Fuel on 03/18/24. Consideration made for cMRI but patient expressed concerns for claustrophobia. She has repeat TTE pending for later this year/early 2026.   Since last being seen in our clinic the patient reports doing well.  she denies chest pain, palpitations, dyspnea, PND, orthopnea, nausea, vomiting, dizziness, syncope, edema, weight gain, or early satiety.   Review of systems complete and found to be negative unless listed in HPI.   EP Information / Studies Reviewed:    EKG is not ordered today. EKG from 12/11/23 reviewed which showed AS/BiV paced rhythm.       ICD Interrogation-  reviewed in detail today,  See PACEART report.  Arrhythmia/Device History [employer] - BiV ICD - Latitude NXT- GORE Lead   Physical Exam:   VS:  There were no vitals taken for this visit.   Wt Readings from Last 3 Encounters:  03/18/24 165 lb 9.6 oz (75.1 kg)  06/13/23 171 lb 9.6 oz (77.8 kg)  12/28/22 168 lb (76.2 kg)     GEN: No acute distress  NECK: No JVD CARDIAC: Regular  rate and rhythm, no murmurs, rubs, gallops RESPIRATORY:  Clear to auscultation without rales, wheezing or rhonchi  ABDOMEN: Soft, non-tender, non-distended EXTREMITIES:  No edema; No deformity   ASSESSMENT AND PLAN:    Chronic systolic CHF  s/p Environmental Manager CRT-D  Euvolemic today. Stable on an appropriate medical regimen Patient's ECGs post implant not optimal with LBBB morphology. EF essentially unchanged and remains low, though symptomatically patient is doing well. Could consider optimization in the future, would wait until better clarification of LV mass.  Normal ICD function. 99% BiV paced. Dependent. Patient has single coil Boston GORE lead. RV shock impedence stable, <90ohms. No reprogramming needed.  See Pace Art report No changes today  PVCs Patient historically with high PVC burden. Managed for years with Amiodarone , now on Mexiletine per Dr. Bensimhon since May 2025. Tolerating well and device interrogation shows minimal PVC burden.   LV mass August 2025 echo with large LV mass, suspected thrombus. Now on Eliquis  5mg  BID, pending repeat echo to reassess later this year.    Disposition:   Patient previously followed by Dr. Birmingham. Discussed his retirement, will plan for her to follow up with Dr. Almetta in 6 months. Consider device optimization.   Signed, Artist Pouch, PA-C

## 2024-06-11 NOTE — Patient Instructions (Signed)
 Medication Instructions:   Your physician recommends that you continue on your current medications as directed. Please refer to the Current Medication list given to you today.  *If you need a refill on your cardiac medications before your next appointment, please call your pharmacy*  Lab Work: NONE ORDERED  TODAY   If you have labs (blood work) drawn today and your tests are completely normal, you will receive your results only by: MyChart Message (if you have MyChart) OR A paper copy in the mail If you have any lab test that is abnormal or we need to change your treatment, we will call you to review the results.   Testing/Procedures: NONE ORDERED  TODAY     Follow-Up: At [facility], you and your health needs are our priority.  As part of our continuing mission to provide you with exceptional heart care, our providers are all part of one team.  This team includes your primary Cardiologist (physician) and Advanced Practice Providers or APPs (Physician Assistants and Nurse Practitioners) who all work together to provide you with the care you need, when you need it.  Your next appointment:   1 year(s)  Provider:  You may see  Dr Almetta   We recommend signing up for the patient portal called MyChart.  Sign up information is provided on this After Visit Summary.  MyChart is used to connect with patients for Virtual Visits (Telemedicine).  Patients are able to view lab/test results, encounter notes, upcoming appointments, etc.  Non-urgent messages can be sent to your provider as well.   To learn more about what you can do with MyChart, go to [email or web address].   Other Instructions

## 2024-07-01 ENCOUNTER — Ambulatory Visit: Payer: 59

## 2024-07-01 DIAGNOSIS — I5022 Chronic systolic (congestive) heart failure: Secondary | ICD-10-CM

## 2024-07-02 LAB — CUP PACEART REMOTE DEVICE CHECK
Battery Remaining Longevity: 48 mo
Battery Remaining Percentage: 58 %
Brady Statistic RA Percent Paced: 0 %
Brady Statistic RV Percent Paced: 99 %
Date Time Interrogation Session: 20251216041300
HighPow Impedance: 71 Ohm
Implantable Lead Connection Status: 753985
Implantable Lead Connection Status: 753985
Implantable Lead Connection Status: 753985
Implantable Lead Implant Date: 20200316
Implantable Lead Implant Date: 20200316
Implantable Lead Implant Date: 20200316
Implantable Lead Location: 753858
Implantable Lead Location: 753859
Implantable Lead Location: 753860
Implantable Lead Model: 292
Implantable Lead Model: 4671
Implantable Lead Model: 7740
Implantable Lead Serial Number: 1013232
Implantable Lead Serial Number: 446892
Implantable Lead Serial Number: 827624
Implantable Pulse Generator Implant Date: 20200316
Lead Channel Impedance Value: 1025 Ohm
Lead Channel Impedance Value: 415 Ohm
Lead Channel Impedance Value: 786 Ohm
Lead Channel Setting Pacing Amplitude: 2.4 V
Lead Channel Setting Pacing Amplitude: 2.5 V
Lead Channel Setting Pacing Amplitude: 2.5 V
Lead Channel Setting Pacing Pulse Width: 0.6 ms
Lead Channel Setting Pacing Pulse Width: 1 ms
Lead Channel Setting Sensing Sensitivity: 0.5 mV
Lead Channel Setting Sensing Sensitivity: 1 mV
Pulse Gen Serial Number: 221486
Zone Setting Status: 755011

## 2024-07-13 ENCOUNTER — Ambulatory Visit: Payer: Self-pay | Admitting: Internal Medicine

## 2024-07-22 NOTE — Progress Notes (Signed)
 "    Advanced [facility] Failure Clinic Note  Date:  07/25/2024  ID:  THI KLICH, DOB [birth year], MRN [id number]  Location: Home  Type of Visit: Established patient  PCP:  Mavis Redge SAILOR, FNP  Cardiologist:  Toribio Fuel, MD Primary HF: Bensimhon  Chief Complaint: Heart Failure follow-up   History of Present Illness: Savannah Bell is a 74 y.o. female with chronic systolic HF due to NICM and history of frequent PVCs and CKD IIIb.    Admitted 11/2017 with acute systolic HF. Echo EF 15%. R/LHC showed normal coronaries and normal LV pressures. cMRI showed EF 17% with no LGE. She was noted to have frequent PVC's on tele. Amio started.  We saw her in 2/20 and doing well but EF persistently 20% despite suppression of PVCs. Losartan  increased and referred for ICD, CPX and sleep study.  Underwent implant of BSci CRT-D in 3/20 by Dr. Waddell. CPX showed moderate to severe HF limitation. Home sleep study 4/20: No significant OSA. AHI 4.9  Today she returns for AHF follow up. Overall feeling ok. Denies palpitations, CP, dizziness, edema, or PND/Orthopnea. No SOB. Appetite ok. No fever or chills. Weight at home 168 pounds. Taking all medications. Denies ETOH, tobacco or drug use. Drinks >64 oz fluid a day.   ICD interrogation: HL score 4. No VT/VF/AT/AFA. Activity level 1.6 hr/day. 99% BiV pacing. Personally reviewed   Cardiac studies: Echo today 1/26: final read pending. Dr. Fuel reviewed: EF 20-25%, LV markedly dilated,  LV thrombus no longer seen, RV normal.  Echo 8/25: EF 20-25% + large 2.1 x 2.5cm circumscribed LV thrombus adherent to the basilar antero-lateral wall, global HK, RV normal.  Echo 11/23: EF 20-25%, RV normal  Echo 05/22: EF 20-25%, RV okay Echo 02/20/19 EF 25% Echo 08/22/2018: EF 20%,  Echo 03/27/18 EF 20-25% but LV less dilated  CPX 08/26/18  FVC 2.31 (109%)      FEV1 1.85 (109%)        FEV1/FVC 80 (100%)        MVV 74 (95%)       Resting HR: 72 Peak HR: 137    (89% age predicted max HR) BP rest: 98/60 BP peak: 112/60 Peak VO2: 11.7 (64% predicted peak VO2) VE/VCO2 slope:  41 OUES: 0.75 Peak RER: 1.13 VE/MVV:  46% O2pulse:  6   (75% predicted O2pulse)   PYP 10/20 Read as strongly suggestive of TTR with H/CL of 1.65 but visually it is equivocal at worst (Reviewed by Dr. Fuel who felt study was negative).   R/LHC 5/29: that showed normal coronary arteries, EF 10-20%, normal LV filling pressures (no right sided pressures recorded).    Cardiac MRI 12/13/17: EF 17%, findings consistent with NICM with no evidence of LGE to suggest inflammatory or infiltrative cardiomyopathy.    Past Medical History:  Diagnosis Date   CHF (congestive heart failure) (HCC)    History of blood transfusion    when I had colon resection (12/11/2017)   Migraine    stopped in the 1970s (12/11/2017)   Mitral valve prolapse    Past Surgical History:  Procedure Laterality Date   ABDOMINAL HYSTERECTOMY  1998   APPENDECTOMY  2003   APPLICATION OF WOUND VAC  09/05/2011   Procedure: APPLICATION OF WOUND VAC;  Surgeon: Donnice Bury, MD;  Location: [facility];  Service: General;  Laterality: N/A;   BIV ICD INSERTION CRT-D N/A 09/30/2018   Procedure: BIV ICD INSERTION CRT-D;  Surgeon: Waddell Danelle ORN, MD;  Location: [facility];  Service: Cardiovascular;  Laterality: N/A;   BOWEL RESECTION  09/05/2011   Procedure: SMALL BOWEL RESECTION;  Surgeon: Donnice Bury, MD;  Location: [facility];  Service: General;  Laterality: N/A;   BUNIONECTOMY WITH HAMMERTOE RECONSTRUCTION Bilateral    & shortened toes   CESAREAN SECTION  1972   COLON SURGERY     LAPAROTOMY  09/05/2011   Procedure: EXPLORATORY LAPAROTOMY;  Surgeon: Donnice Bury, MD;  Location: [facility];  Service: General;  Laterality: N/A;   LEFT HEART CATH AND CORONARY ANGIOGRAPHY N/A 12/12/2017   Procedure: LEFT HEART CATH AND CORONARY ANGIOGRAPHY;  Surgeon: Claudene Victory ORN, MD;  Location: [facility];   Service: Cardiovascular;  Laterality: N/A;   TUBAL LIGATION  1974   Retired from the [employer] on February 14, 2023 after 42 years.  Current Outpatient Medications  Medication Sig Dispense Refill   apixaban  (ELIQUIS ) 5 MG TABS tablet Take 1 tablet (5 mg total) by mouth 2 (two) times daily. 180 tablet 3   atorvastatin  (LIPITOR) 40 MG tablet TAKE 1 TABLET BY MOUTH EVERY DAY 90 tablet 3   calcium  carbonate (OS-CAL) 600 MG TABS tablet Take 1,200 mg by mouth daily with breakfast.     Calcium  Citrate-Vitamin D 500-12.5 MG-MCG PACK Take by mouth. vitaminD is separte rom calcium      carvedilol  (COREG ) 3.125 MG tablet TAKE 1 TABLET BY MOUTH 2 (TWO) TIMES DAILY WITH A MEAL. NEEDS FOLLOW UP APPOINTMENT FOR MORE REFILLS 180 tablet 0   dapagliflozin  propanediol (FARXIGA ) 10 MG TABS tablet Take 1 tablet (10 mg total) by mouth daily. 30 tablet 11   mexiletine (MEXITIL) 200 MG capsule TAKE 1 CAPSULE BY MOUTH TWICE A DAY 180 capsule 1   potassium chloride  SA (KLOR-CON ) 20 MEQ tablet Take 1 tablet (20 mEq total) by mouth daily as needed (when you take Furosemide ). 10 tablet 3   sacubitril -valsartan  (ENTRESTO ) 24-26 MG Take 1 tablet by mouth 2 (two) times daily. 60 tablet 6   No current facility-administered medications for this encounter.    Allergies:   Patient has no known allergies.   Social History:  The patient  reports that she quit smoking about 43 years ago. Her smoking use included cigarettes. She started smoking about 53 years ago. She has a 5 pack-year smoking history. She quit smokeless tobacco use about 26 years ago.  Her smokeless tobacco use included snuff and chew. She reports that she does not currently use alcohol. She reports that she does not use drugs.   Family History:  The patient's family history includes Sudden Cardiac Death in her mother.   ROS:  Please see the history of present illness.   All other systems are personally reviewed and negative.   Vitals:   07/25/24 1458  BP:  116/84  Pulse: 84  SpO2: 97%  Weight: 75.9 kg (167 lb 6.4 oz)    Wt Readings from Last 3 Encounters:  07/25/24 75.9 kg (167 lb 6.4 oz)  06/11/24 74.8 kg (165 lb)  03/18/24 75.1 kg (165 lb 9.6 oz)   Physical Exam  General:  well appearing.  No respiratory difficulty. Walked into clinic.  Neck: JVD flat.  Cor: Regular rate & rhythm. No murmurs. Lungs: clear Extremities: no edema  Neuro: alert & oriented x 3. Affect pleasant.   Recent Labs: No results found for requested labs within last 365 days.   Wt Readings from Last 3 Encounters:  07/25/24 75.9 kg (167 lb 6.4  oz)  06/11/24 74.8 kg (165 lb)  03/18/24 75.1 kg (165 lb 9.6 oz)    EKG: A sensed V paced 89 bpm qtc 520 (Personally reviewed)    ASSESSMENT AND PLAN:  1. Chronic systolic HF due to NICM.  -Normal coronaries on [facility] 12/11/17. cMRI shows no infiltrative or inflammatory CM. No family hx of HF. No ETOH. No hx of HTN. No recent virus. Frequent PVCs remains only real smoking gun but we have suppressed PVCs and EF still down. Unable to get cMRI due to ICD - Echo 12/12/17 EF 15% with grade 1 DD, no LV thrombus, mild MR, trivial TR, PA peak pressure 31 mmHg, and trivial pericardial effusion.  - Echo 03/27/18 EF 20-25% but LV less dilated - cMRI 5/19 EF 17% no LGE - Echo 08/22/2018 EF remains low ~20% - s/p BSCi CRT-D in 3/20 - Echo 02/20/19 EF 25% Mildly reduced RV function - PYP 10/20 Read as strongly suggestive of TTR with H/CL of 1.65 but visually it is equivocal at worst (MD thinks negative) - CPX 2/20 BP rest: 98/60 BP peak: 112/60 Peak VO2: 11.7 (64% predicted peak VO2) VE/VCO2 slope: 41 Peak RER: 1.13 - Echo 05/22: EF 20-25%, RV okay - Echo 11/23: EF 20-25%, RV ok  - Echo 8/25 EF 20-25%, RV ok. + ?Large LV Thrombus  - Echo today final read pending. Dr. Cherrie reviewed: EF 20-25%, LV markedly dilated,  LV thrombus no longer seen, RV normal.  - Stable NYHA I-II symptoms despite severe LV dysfunction and CPX test 2/20  showing significant HF limitation. - Euvolemic on exam and device interrogation. Does not need daily loop diuretic  - Continue Entresto  24/26 bid. Has failed titration in past - Off spiro w/ hyperkalemia (most recent K 5.0)  - Continue coreg  3.125 mg BID. Intolerant to up-titration. - Continue Farxiga  10mg  daily.  - Next labs due march with [facility]. Will check BMET, P-BNP and CBC today.  - So far EF has not improved with PVC suppression.   2. LV Mass/ ?Thrombus - Large 2.1 x 2.5cm circumscribed LV thrombus adherent to the basilar antero-lateral wall noted on Echo 8/25 - Continue Eliquis  5 mg bid. CBC today, denies abnormal bleeding.  - LV thrombus no longer seen on echo today. Discussed with Dr. Bensimhon, would continue Eliquis  indefinitely with poor EF.   3. Frequent PVCs - ~20% burden on tele initially - Zio Patch 03/27/18 with sinus rhythm with RBBB, Rare PVCs, No high-grade arrythmias or pauses. Pt triggered events all NSR.  - On Mexiletine 200 bid. Device interrogation shows ? 99% LV pacing, suggesting PVC burden likely not significant  - Continue Mexilitine 200 mg bid     4. Hyperlipidemia  - on Atorva 40 mg daily  - Managed by PCP    5. Snoring - Sleep study negative (AHI 4.9)   6. CKD IIIb - b/l Scr ~1.6 - on Farxiga  - Followed by Dr. Tobie w/ [facility]. Next visit in March.   Follow up with Dr. Bensimhon in 3 months.   Signed, Beckey LITTIE Coe, NP  07/25/2024 3:15 PM  Advanced [facility] [address] [facility] [place] [address] [phone] (office) "

## 2024-07-24 ENCOUNTER — Telehealth (HOSPITAL_COMMUNITY): Payer: Self-pay | Admitting: *Deleted

## 2024-07-24 NOTE — Telephone Encounter (Signed)
 Called to confirm/remind patient of their appointment at the [facility] Clinic on  07/25/24:      Appointment:              [x] Confirmed             [] Left mess              [] No answer/No voice mail             [] Phone not in service   Patient reminded to bring all medications and/or complete list.   Confirmed patient has transportation. Gave directions, instructed to utilize valet parking.

## 2024-07-25 ENCOUNTER — Ambulatory Visit (HOSPITAL_COMMUNITY): Payer: Self-pay | Admitting: Internal Medicine

## 2024-07-25 ENCOUNTER — Encounter (HOSPITAL_COMMUNITY): Payer: Self-pay

## 2024-07-25 ENCOUNTER — Ambulatory Visit (HOSPITAL_COMMUNITY)
Admission: RE | Admit: 2024-07-25 | Discharge: 2024-07-25 | Disposition: A | Source: Ambulatory Visit | Attending: Cardiology | Admitting: Cardiology

## 2024-07-25 ENCOUNTER — Telehealth (HOSPITAL_COMMUNITY): Payer: Self-pay

## 2024-07-25 VITALS — BP 116/84 | HR 84 | Wt 167.4 lb

## 2024-07-25 DIAGNOSIS — I493 Ventricular premature depolarization: Secondary | ICD-10-CM | POA: Insufficient documentation

## 2024-07-25 DIAGNOSIS — Z86718 Personal history of other venous thrombosis and embolism: Secondary | ICD-10-CM | POA: Diagnosis not present

## 2024-07-25 DIAGNOSIS — N1832 Chronic kidney disease, stage 3b: Secondary | ICD-10-CM | POA: Diagnosis not present

## 2024-07-25 DIAGNOSIS — E782 Mixed hyperlipidemia: Secondary | ICD-10-CM | POA: Diagnosis not present

## 2024-07-25 DIAGNOSIS — I341 Nonrheumatic mitral (valve) prolapse: Secondary | ICD-10-CM | POA: Diagnosis not present

## 2024-07-25 DIAGNOSIS — Z7984 Long term (current) use of oral hypoglycemic drugs: Secondary | ICD-10-CM | POA: Diagnosis not present

## 2024-07-25 DIAGNOSIS — I5022 Chronic systolic (congestive) heart failure: Secondary | ICD-10-CM | POA: Insufficient documentation

## 2024-07-25 DIAGNOSIS — E785 Hyperlipidemia, unspecified: Secondary | ICD-10-CM | POA: Diagnosis not present

## 2024-07-25 DIAGNOSIS — I428 Other cardiomyopathies: Secondary | ICD-10-CM | POA: Diagnosis not present

## 2024-07-25 DIAGNOSIS — I340 Nonrheumatic mitral (valve) insufficiency: Secondary | ICD-10-CM | POA: Insufficient documentation

## 2024-07-25 DIAGNOSIS — Z79899 Other long term (current) drug therapy: Secondary | ICD-10-CM | POA: Diagnosis not present

## 2024-07-25 DIAGNOSIS — Z87891 Personal history of nicotine dependence: Secondary | ICD-10-CM | POA: Insufficient documentation

## 2024-07-25 DIAGNOSIS — Z7901 Long term (current) use of anticoagulants: Secondary | ICD-10-CM | POA: Insufficient documentation

## 2024-07-25 DIAGNOSIS — I513 Intracardiac thrombosis, not elsewhere classified: Secondary | ICD-10-CM | POA: Diagnosis not present

## 2024-07-25 LAB — CBC
HCT: 43.8 % (ref 36.0–46.0)
Hemoglobin: 14.3 g/dL (ref 12.0–15.0)
MCH: 29.3 pg (ref 26.0–34.0)
MCHC: 32.6 g/dL (ref 30.0–36.0)
MCV: 89.8 fL (ref 80.0–100.0)
Platelets: 125 K/uL — ABNORMAL LOW (ref 150–400)
RBC: 4.88 MIL/uL (ref 3.87–5.11)
RDW: 13.5 % (ref 11.5–15.5)
WBC: 3.2 K/uL — ABNORMAL LOW (ref 4.0–10.5)
nRBC: 0 % (ref 0.0–0.2)

## 2024-07-25 LAB — BASIC METABOLIC PANEL WITH GFR
Anion gap: 11 (ref 5–15)
BUN: 11 mg/dL (ref 8–23)
CO2: 26 mmol/L (ref 22–32)
Calcium: 9.5 mg/dL (ref 8.9–10.3)
Chloride: 103 mmol/L (ref 98–111)
Creatinine, Ser: 1.35 mg/dL — ABNORMAL HIGH (ref 0.44–1.00)
GFR, Estimated: 42 mL/min — ABNORMAL LOW
Glucose, Bld: 103 mg/dL — ABNORMAL HIGH (ref 70–99)
Potassium: 4.6 mmol/L (ref 3.5–5.1)
Sodium: 139 mmol/L (ref 135–145)

## 2024-07-25 LAB — ECHOCARDIOGRAM COMPLETE
AR max vel: 2.43 cm2
AV Area VTI: 2.06 cm2
AV Area mean vel: 2.06 cm2
AV Mean grad: 5 mmHg
AV Peak grad: 6.7 mmHg
Ao pk vel: 1.29 m/s
Area-P 1/2: 3.53 cm2
Calc EF: 14.8 %
Est EF: <20
S' Lateral: 6.3 cm
Single Plane A2C EF: 13.7 %
Single Plane A4C EF: 13.4 %

## 2024-07-25 LAB — PRO BRAIN NATRIURETIC PEPTIDE: Pro Brain Natriuretic Peptide: 1492 pg/mL — ABNORMAL HIGH

## 2024-07-25 NOTE — Telephone Encounter (Signed)
 Called to confirm/remind patient of their appointment at the [facility] on 07/28/24.   Appointment:   [x] Confirmed  [] Left mess   [] No answer/No voice mail  [] VM Full/unable to leave message  [] Phone not in service  Patient reminded to bring all medications and/or complete list.  Confirmed patient has transportation. Gave directions, instructed to utilize valet parking.

## 2024-07-25 NOTE — Patient Instructions (Signed)
 There has been no changes to your medications.  Labs done today, your results will be available in MyChart, we will contact you for abnormal readings.  Your physician recommends that you schedule a follow-up appointment in: 3 months ( April) ** PLEASE CALL THE OFFICE IN [place] TO ARRANGE YOUR FOLLOW UP APPOINTMENT.**  If you have any questions or concerns before your next appointment please send us  a message through [place] or call our office at [phone].    TO LEAVE A MESSAGE FOR THE NURSE SELECT OPTION 2, PLEASE LEAVE A MESSAGE INCLUDING: YOUR NAME DATE OF BIRTH CALL BACK NUMBER REASON FOR CALL**this is important as we prioritize the call backs  YOU WILL RECEIVE A CALL BACK THE SAME DAY AS LONG AS YOU CALL BEFORE 4:00 PM  At the [facility], you and your health needs are our priority. As part of our continuing mission to provide you with exceptional heart care, we have created designated Provider Care Teams. These Care Teams include your primary Cardiologist (physician) and Advanced Practice Providers (APPs- Physician Assistants and Nurse Practitioners) who all work together to provide you with the care you need, when you need it.   You may see any of the following providers on your designated Care Team at your next follow up: Dr Toribio Fuel Dr Ezra Shuck Dr. Morene Brownie Greig Mosses, NP Caffie Shed, [facility] [place], [place] Beckey Coe, NP Jordan Lee, NP Ellouise Class, NP Tinnie Redman, PharmD Jaun Bash, PharmD   Please be sure to bring in all your medications bottles to every appointment.    Thank you for choosing [facility]

## 2024-07-25 NOTE — Progress Notes (Signed)
 2D echo complete, patient declined definity  image enhancement.
